# Patient Record
Sex: Female | Born: 1990 | Race: White | Hispanic: No | Marital: Single | Smoking: Current every day smoker
Health system: Southern US, Community
[De-identification: ages and names within clinical notes are randomized; demographics above are authoritative.]

## PROBLEM LIST (undated history)

## (undated) DIAGNOSIS — Z22322 Carrier or suspected carrier of Methicillin resistant Staphylococcus aureus: Secondary | ICD-10-CM

## (undated) DIAGNOSIS — B1920 Unspecified viral hepatitis C without hepatic coma: Secondary | ICD-10-CM

## (undated) DIAGNOSIS — F988 Other specified behavioral and emotional disorders with onset usually occurring in childhood and adolescence: Secondary | ICD-10-CM

## (undated) DIAGNOSIS — F1910 Other psychoactive substance abuse, uncomplicated: Secondary | ICD-10-CM

## (undated) HISTORY — DX: Other specified behavioral and emotional disorders with onset usually occurring in childhood and adolescence: F98.8

## (undated) HISTORY — PX: APPENDECTOMY: SHX54

---
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
## 2010-04-19 ENCOUNTER — Emergency Department (HOSPITAL_COMMUNITY): Admission: EM | Admit: 2010-04-19 | Discharge: 2010-04-19 | Payer: Self-pay | Admitting: Emergency Medicine

## 2011-01-25 LAB — POCT I-STAT, CHEM 8
BUN: 10 mg/dL (ref 6–23)
Calcium, Ion: 1.17 mmol/L (ref 1.12–1.32)
Chloride: 105 mEq/L (ref 96–112)
Creatinine, Ser: 0.8 mg/dL (ref 0.4–1.2)
Glucose, Bld: 92 mg/dL (ref 70–99)
HCT: 45 % (ref 36.0–46.0)
Hemoglobin: 15.3 g/dL — ABNORMAL HIGH (ref 12.0–15.0)
Potassium: 4 mEq/L (ref 3.5–5.1)
Sodium: 138 mEq/L (ref 135–145)
TCO2: 23 mmol/L (ref 0–100)

## 2011-01-25 LAB — POCT PREGNANCY, URINE: Preg Test, Ur: NEGATIVE

## 2012-07-25 ENCOUNTER — Ambulatory Visit (INDEPENDENT_AMBULATORY_CARE_PROVIDER_SITE_OTHER): Payer: 59 | Admitting: Emergency Medicine

## 2012-07-25 VITALS — BP 120/80 | HR 100 | Temp 98.4°F | Resp 16 | Ht 63.0 in | Wt 109.8 lb

## 2012-07-25 DIAGNOSIS — F988 Other specified behavioral and emotional disorders with onset usually occurring in childhood and adolescence: Secondary | ICD-10-CM

## 2012-07-25 MED ORDER — AMPHETAMINE-DEXTROAMPHET ER 20 MG PO CP24: 20.0000 mg | ORAL_CAPSULE | ORAL | Status: DC

## 2012-07-25 NOTE — Progress Notes (Signed)
   Date:  07/25/2012   Name:  Cassandra Roberts   DOB:  1991-06-18   MRN:  [id number] Gender: female Age: 21 y.o.  PCP:  Tonye Pearson, MD    Chief Complaint: Medication Refill   History of Present Illness:  Cassandra Roberts is a 21 y.o. pleasant patient who presents with the following:  Requesting a refill on aderall which was previously prescribed by Dr. Merla Riches for ADD.  Resuming college and working and needs to sharpen her focus as she is forgetting deadlines and having difficulty concentrating while studying.  Last prescription here was 20 mg and she said while living in [place], her doctor increased her to 20 in am and 10 in afternoon and then to 20 twice a day.  As she has no record of that, until she gets records sent here substantiating the change, we will stop at 20 XR.  There is no problem list on file for this patient.   No past medical history on file.  No past surgical history on file.  History  Substance Use Topics  . Smoking status: Current Every Day Smoker -- 2 years    Types: Cigarettes  . Smokeless tobacco: Not on file  . Alcohol Use: Not on file    No family history on file.  No Known Allergies  Medication list has been reviewed and updated.  No outpatient prescriptions prior to visit.    Review of Systems:  As per HPI, otherwise negative.    Physical Examination: Filed Vitals:   07/25/12 1620  BP: 120/80  Pulse: 100  Temp: 98.4 F (36.9 C)  Resp: 16   Filed Vitals:   07/25/12 1620  Height: 5\' 3"  (1.6 m)  Weight: 109 lb 12.8 oz (49.805 kg)   Body mass index is 19.45 kg/(m^2). Ideal Body Weight: Weight in (lb) to have BMI = 25: 140.8    GEN: WDWN, NAD, Non-toxic, Alert & Oriented x 3 HEENT: Atraumatic, Normocephalic.  Ears and Nose: No external deformity. EXTR: No clubbing/cyanosis/edema NEURO: Normal gait.  PSYCH: Normally interactive. Conversant. Not depressed or anxious appearing.  Calm demeanor.    Assessment and  Plan: ADD Refill meds  Follow up as needed  Carmelina Dane, MD I have reviewed and agree with documentation. Robert P. Merla Riches, M.D.

## 2012-09-22 ENCOUNTER — Encounter (HOSPITAL_COMMUNITY): Payer: Self-pay | Admitting: *Deleted

## 2012-09-22 ENCOUNTER — Emergency Department (HOSPITAL_COMMUNITY)
Admission: EM | Admit: 2012-09-22 | Discharge: 2012-09-22 | Discharge status: Against Medical Advice | Payer: 59 | Attending: Emergency Medicine | Admitting: Emergency Medicine

## 2012-09-22 DIAGNOSIS — R05 Cough: Secondary | ICD-10-CM | POA: Insufficient documentation

## 2012-09-22 DIAGNOSIS — R059 Cough, unspecified: Secondary | ICD-10-CM | POA: Insufficient documentation

## 2012-09-22 DIAGNOSIS — Z5321 Procedure and treatment not carried out due to patient leaving prior to being seen by health care provider: Secondary | ICD-10-CM

## 2012-09-22 NOTE — ED Notes (Signed)
The pt refuses a chest xray due to the expense

## 2012-09-22 NOTE — ED Notes (Signed)
Patient called x2 with no response from waiting room.

## 2012-09-22 NOTE — ED Notes (Signed)
Productive cough yellow

## 2012-09-22 NOTE — ED Notes (Signed)
She has a cough for 3 days and the over-the counter  Cough meds makes her feel bad.  Only cough med from a doctor helps.  She does not know the name

## 2012-10-01 ENCOUNTER — Emergency Department (HOSPITAL_COMMUNITY): Payer: 59

## 2012-10-01 ENCOUNTER — Emergency Department (HOSPITAL_COMMUNITY)
Admission: EM | Admit: 2012-10-01 | Discharge: 2012-10-01 | Disposition: A | Discharge status: Home / Self Care | Payer: 59 | Attending: Emergency Medicine | Admitting: Emergency Medicine

## 2012-10-01 ENCOUNTER — Encounter (HOSPITAL_COMMUNITY): Payer: Self-pay | Admitting: Emergency Medicine

## 2012-10-01 DIAGNOSIS — S20219A Contusion of unspecified front wall of thorax, initial encounter: Secondary | ICD-10-CM | POA: Insufficient documentation

## 2012-10-01 DIAGNOSIS — Y9389 Activity, other specified: Secondary | ICD-10-CM | POA: Insufficient documentation

## 2012-10-01 DIAGNOSIS — F17200 Nicotine dependence, unspecified, uncomplicated: Secondary | ICD-10-CM | POA: Insufficient documentation

## 2012-10-01 LAB — URINALYSIS, ROUTINE W REFLEX MICROSCOPIC
Bilirubin Urine: NEGATIVE
Glucose, UA: NEGATIVE mg/dL
Hgb urine dipstick: NEGATIVE
Protein, ur: NEGATIVE mg/dL

## 2012-10-01 MED ORDER — IBUPROFEN 600 MG PO TABS: 600.0000 mg | ORAL_TABLET | Freq: Four times a day (QID) | ORAL | Status: DC | PRN

## 2012-10-01 MED ORDER — HYDROCODONE-ACETAMINOPHEN 5-500 MG PO TABS: 1.0000 | ORAL_TABLET | Freq: Four times a day (QID) | ORAL | Status: DC | PRN

## 2012-10-01 NOTE — ED Provider Notes (Signed)
Medical screening examination/treatment/procedure(s) were performed by non-physician practitioner and as supervising physician I was immediately available for consultation/collaboration.   Celene Kras, MD 10/01/12 [phone]
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
## 2012-10-01 NOTE — ED Notes (Signed)
Patient returned from X-ray ambulatory with steady gait.  
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
## 2012-10-03 ENCOUNTER — Telehealth: Payer: Self-pay

## 2012-10-03 NOTE — Telephone Encounter (Signed)
PT NEEDS REFILL ON ADDERALL. [phone]

## 2012-10-03 NOTE — Telephone Encounter (Signed)
Can't do/more than 6 mos since last ov

## 2012-10-04 NOTE — Telephone Encounter (Signed)
Left message for patient to call back  

## 2012-10-05 NOTE — Telephone Encounter (Signed)
LMOM for patient to call back.

## 2012-10-06 NOTE — Telephone Encounter (Signed)
LMOM that she needs to RTC for RF of Adderall and she may CB if she would like to get Dr Doolittle's schedule.

## 2012-10-06 NOTE — Telephone Encounter (Signed)
No refills of controlled drugs without office visit.  Ideally, she should be referred to 104 for chronic care of ADD.

## 2012-10-06 NOTE — Telephone Encounter (Signed)
Dr Dareen Piano, you saw pt for ADD in 07/2012 w/note to f/up as needed. Is it ok to RF her Adderall XR 20 mg QD now and for 6 mos from that OV? EPIC shows last RF at OV on 07/25/12.

## 2012-11-07 ENCOUNTER — Telehealth: Payer: Self-pay

## 2012-11-07 NOTE — Telephone Encounter (Signed)
Pt is out of adderall and cant afford to come in to be seen and she does not know what to do about this  Best number [phone]

## 2012-11-07 NOTE — Telephone Encounter (Signed)
Would like to add a different number to call her [phone]

## 2012-11-08 NOTE — Telephone Encounter (Signed)
Called pt and advised her that we are very sorry but as Dr Dareen Piano had stated last month (see phone message), pt will have to RTC and est care w/a primary provider here who can continue to Rx her Adderall. Pt verbalized understanding.

## 2012-11-27 ENCOUNTER — Emergency Department (HOSPITAL_COMMUNITY): Payer: Self-pay

## 2012-11-27 ENCOUNTER — Emergency Department (HOSPITAL_COMMUNITY)
Admission: EM | Admit: 2012-11-27 | Discharge: 2012-11-27 | Disposition: A | Discharge status: Home / Self Care | Payer: Self-pay | Attending: Emergency Medicine | Admitting: Emergency Medicine

## 2012-11-27 DIAGNOSIS — R509 Fever, unspecified: Secondary | ICD-10-CM | POA: Insufficient documentation

## 2012-11-27 DIAGNOSIS — R05 Cough: Secondary | ICD-10-CM | POA: Insufficient documentation

## 2012-11-27 DIAGNOSIS — F17200 Nicotine dependence, unspecified, uncomplicated: Secondary | ICD-10-CM | POA: Insufficient documentation

## 2012-11-27 DIAGNOSIS — R059 Cough, unspecified: Secondary | ICD-10-CM | POA: Insufficient documentation

## 2012-11-27 DIAGNOSIS — Z79899 Other long term (current) drug therapy: Secondary | ICD-10-CM | POA: Insufficient documentation

## 2012-11-27 DIAGNOSIS — M940 Chondrocostal junction syndrome [Tietze]: Secondary | ICD-10-CM | POA: Insufficient documentation

## 2012-11-27 DIAGNOSIS — J3489 Other specified disorders of nose and nasal sinuses: Secondary | ICD-10-CM | POA: Insufficient documentation

## 2012-11-27 MED ORDER — HYDROCODONE-ACETAMINOPHEN 7.5-500 MG/15ML PO SOLN: 5.0000 mL | ORAL | Status: DC | PRN
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
## 2012-11-27 NOTE — ED Provider Notes (Signed)
Medical screening examination/treatment/procedure(s) were performed by non-physician practitioner and as supervising physician I was immediately available for consultation/collaboration.   Glynn Octave, MD 11/27/12 [phone]

## 2012-12-15 ENCOUNTER — Ambulatory Visit (INDEPENDENT_AMBULATORY_CARE_PROVIDER_SITE_OTHER): Payer: Managed Care, Other (non HMO) | Admitting: Family Medicine

## 2012-12-15 ENCOUNTER — Ambulatory Visit: Payer: Managed Care, Other (non HMO)

## 2012-12-15 VITALS — BP 137/86 | HR 88 | Temp 98.3°F | Resp 16 | Ht 63.0 in | Wt 116.0 lb

## 2012-12-15 DIAGNOSIS — M542 Cervicalgia: Secondary | ICD-10-CM

## 2012-12-15 DIAGNOSIS — S139XXA Sprain of joints and ligaments of unspecified parts of neck, initial encounter: Secondary | ICD-10-CM

## 2012-12-15 DIAGNOSIS — M419 Scoliosis, unspecified: Secondary | ICD-10-CM

## 2012-12-15 DIAGNOSIS — F988 Other specified behavioral and emotional disorders with onset usually occurring in childhood and adolescence: Secondary | ICD-10-CM

## 2012-12-15 MED ORDER — AMPHETAMINE-DEXTROAMPHET ER 20 MG PO CP24: 20.0000 mg | ORAL_CAPSULE | ORAL | Status: DC

## 2012-12-15 NOTE — Patient Instructions (Addendum)
1. ADD (attention deficit disorder)  EKG 12-Lead, amphetamine-dextroamphetamine (ADDERALL XR) 20 MG 24 hr capsule  2. Neck pain  DG Cervical Spine Complete    [facility] Policy for Prescribing Controlled Substances (Revised 09/2012) 1. Prescriptions for controlled substances will be filled by ONE provider at [facility] with whom you have established and developed a plan for your care, including follow-up. 2. You are encouraged to schedule an appointment with your prescriber at our appointment center for follow-up visits whenever possible. 3. If you request a prescription for the controlled substance while at [facility] for an acute problem (with someone other than your regular prescriber), you MAY be given a ONE-TIME prescription for a 30-day supply of the controlled substance, to allow time for you to return to see your regular prescriber for additional prescriptions.  PLEASE SCHEDULE FOLLOW-UP WITH DR. Merla Riches BY APPOINTMENT IN ONE MONTH FOR FURTHER REFILLS OF ADDERALL.    Cervical Strain and Sprain (Whiplash) with Rehab Cervical strain and sprains are injuries that commonly occur with "whiplash" injuries. Whiplash occurs when the neck is forcefully whipped backward or forward, such as during a motor vehicle accident. The muscles, ligaments, tendons, discs and nerves of the neck are susceptible to injury when this occurs. SYMPTOMS   Pain or stiffness in the front and/or back of neck  Symptoms may present immediately or up to 24 hours after injury.  Dizziness, headache, nausea and vomiting.  Muscle spasm with soreness and stiffness in the neck.  Tenderness and swelling at the injury site. CAUSES  Whiplash injuries often occur during contact sports or motor vehicle accidents.  RISK INCREASES WITH:  Osteoarthritis of the spine.  Situations that make head or neck accidents or trauma more likely.  High-risk sports (football, rugby, wrestling, hockey, auto racing, gymnastics, diving, contact karate or  boxing).  Poor strength and flexibility of the neck.  Previous neck injury.  Poor tackling technique.  Improperly fitted or padded equipment. PREVENTION  Learn and use proper technique (avoid tackling with the head, spearing and head-butting; use proper falling techniques to avoid landing on the head).  Warm up and stretch properly before activity.  Maintain physical fitness:  Strength, flexibility and endurance.  Cardiovascular fitness.  Wear properly fitted and padded protective equipment, such as padded soft collars, for participation in contact sports. PROGNOSIS  Recovery for cervical strain and sprain injuries is dependent on the extent of the injury. These injuries are usually curable in 1 week to 3 months with appropriate treatment.  RELATED COMPLICATIONS   Temporary numbness and weakness may occur if the nerve roots are damaged, and this may persist until the nerve has completely healed.  Chronic pain due to frequent recurrence of symptoms.  Prolonged healing, especially if activity is resumed too soon (before complete recovery). TREATMENT  Treatment initially involves the use of ice and medication to help reduce pain and inflammation. It is also important to perform strengthening and stretching exercises and modify activities that worsen symptoms so the injury does not get worse. These exercises may be performed at home or with a therapist. For patients who experience severe symptoms, a soft padded collar may be recommended to be worn around the neck.  Improving your posture may help reduce symptoms. Posture improvement includes pulling your chin and abdomen in while sitting or standing. If you are sitting, sit in a firm chair with your buttocks against the back of the chair. While sleeping, try replacing your pillow with a small towel rolled to 2 inches in diameter,  or use a cervical pillow or soft cervical collar. Poor sleeping positions delay healing.  For patients with  nerve root damage, which causes numbness or weakness, the use of a cervical traction apparatus may be recommended. Surgery is rarely necessary for these injuries. However, cervical strain and sprains that are present at birth (congenital) may require surgery. MEDICATION   If pain medication is necessary, nonsteroidal anti-inflammatory medications, such as aspirin and ibuprofen, or other minor pain relievers, such as acetaminophen, are often recommended.  Do not take pain medication for 7 days before surgery.  Prescription pain relievers may be given if deemed necessary by your caregiver. Use only as directed and only as much as you need. HEAT AND COLD:   Cold treatment (icing) relieves pain and reduces inflammation. Cold treatment should be applied for 10 to 15 minutes every 2 to 3 hours for inflammation and pain and immediately after any activity that aggravates your symptoms. Use ice packs or an ice massage.  Heat treatment may be used prior to performing the stretching and strengthening activities prescribed by your caregiver, physical therapist, or athletic trainer. Use a heat pack or a warm soak. SEEK MEDICAL CARE IF:   Symptoms get worse or do not improve in 2 weeks despite treatment.  New, unexplained symptoms develop (drugs used in treatment may produce side effects). EXERCISES RANGE OF MOTION (ROM) AND STRETCHING EXERCISES - Cervical Strain and Sprain These exercises may help you when beginning to rehabilitate your injury. In order to successfully resolve your symptoms, you must improve your posture. These exercises are designed to help reduce the forward-head and rounded-shoulder posture which contributes to this condition. Your symptoms may resolve with or without further involvement from your physician, physical therapist or athletic trainer. While completing these exercises, remember:   Restoring tissue flexibility helps normal motion to return to the joints. This allows healthier,  less painful movement and activity.  An effective stretch should be held for at least 20 seconds, although you may need to begin with shorter hold times for comfort.  A stretch should never be painful. You should only feel a gentle lengthening or release in the stretched tissue. STRETCH- Axial Extensors  Lie on your back on the floor. You may bend your knees for comfort. Place a rolled up hand towel or dish towel, about 2 inches in diameter, under the part of your head that makes contact with the floor.  Gently tuck your chin, as if trying to make a "double chin," until you feel a gentle stretch at the base of your head.  Hold __________ seconds. Repeat __________ times. Complete this exercise __________ times per day.  STRETECH - Axial Extension   Stand or sit on a firm surface. Assume a good posture: chest up, shoulders drawn back, abdominal muscles slightly tense, knees unlocked (if standing) and feet hip width apart.  Slowly retract your chin so your head slides back and your chin slightly lowers.Continue to look straight ahead.  You should feel a gentle stretch in the back of your head. Be certain not to feel an aggressive stretch since this can cause headaches later.  Hold for __________ seconds. Repeat __________ times. Complete this exercise __________ times per day. STRETCH  Cervical Side Bend   Stand or sit on a firm surface. Assume a good posture: chest up, shoulders drawn back, abdominal muscles slightly tense, knees unlocked (if standing) and feet hip width apart.  Without letting your nose or shoulders move, slowly tip your right /  left ear to your shoulder until your feel a gentle stretch in the muscles on the opposite side of your neck.  Hold __________ seconds. Repeat __________ times. Complete this exercise __________ times per day. STRETCH  Cervical Rotators   Stand or sit on a firm surface. Assume a good posture: chest up, shoulders drawn back, abdominal muscles  slightly tense, knees unlocked (if standing) and feet hip width apart.  Keeping your eyes level with the ground, slowly turn your head until you feel a gentle stretch along the back and opposite side of your neck.  Hold __________ seconds. Repeat __________ times. Complete this exercise __________ times per day. RANGE OF MOTION - Neck Circles   Stand or sit on a firm surface. Assume a good posture: chest up, shoulders drawn back, abdominal muscles slightly tense, knees unlocked (if standing) and feet hip width apart.  Gently roll your head down and around from the back of one shoulder to the back of the other. The motion should never be forced or painful.  Repeat the motion 10-20 times, or until you feel the neck muscles relax and loosen. Repeat __________ times. Complete the exercise __________ times per day. STRENGTHENING EXERCISES - Cervical Strain and Sprain These exercises may help you when beginning to rehabilitate your injury. They may resolve your symptoms with or without further involvement from your physician, physical therapist or athletic trainer. While completing these exercises, remember:   Muscles can gain both the endurance and the strength needed for everyday activities through controlled exercises.  Complete these exercises as instructed by your physician, physical therapist or athletic trainer. Progress the resistance and repetitions only as guided.  You may experience muscle soreness or fatigue, but the pain or discomfort you are trying to eliminate should never worsen during these exercises. If this pain does worsen, stop and make certain you are following the directions exactly. If the pain is still present after adjustments, discontinue the exercise until you can discuss the trouble with your clinician. STRENGTH Cervical Flexors, Isometric  Face a wall, standing about 6 inches away. Place a small pillow, a ball about 6-8 inches in diameter, or a folded towel between  your forehead and the wall.  Slightly tuck your chin and gently push your forehead into the soft object. Push only with mild to moderate intensity, building up tension gradually. Keep your jaw and forehead relaxed.  Hold 10 to 20 seconds. Keep your breathing relaxed.  Release the tension slowly. Relax your neck muscles completely before you start the next repetition. Repeat __________ times. Complete this exercise __________ times per day. STRENGTH- Cervical Lateral Flexors, Isometric   Stand about 6 inches away from a wall. Place a small pillow, a ball about 6-8 inches in diameter, or a folded towel between the side of your head and the wall.  Slightly tuck your chin and gently tilt your head into the soft object. Push only with mild to moderate intensity, building up tension gradually. Keep your jaw and forehead relaxed.  Hold 10 to 20 seconds. Keep your breathing relaxed.  Release the tension slowly. Relax your neck muscles completely before you start the next repetition. Repeat __________ times. Complete this exercise __________ times per day. STRENGTH  Cervical Extensors, Isometric   Stand about 6 inches away from a wall. Place a small pillow, a ball about 6-8 inches in diameter, or a folded towel between the back of your head and the wall.  Slightly tuck your chin and gently tilt your head  back into the soft object. Push only with mild to moderate intensity, building up tension gradually. Keep your jaw and forehead relaxed.  Hold 10 to 20 seconds. Keep your breathing relaxed.  Release the tension slowly. Relax your neck muscles completely before you start the next repetition. Repeat __________ times. Complete this exercise __________ times per day. POSTURE AND BODY MECHANICS CONSIDERATIONS - Cervical Strain and Sprain Keeping correct posture when sitting, standing or completing your activities will reduce the stress put on different body tissues, allowing injured tissues a chance  to heal and limiting painful experiences. The following are general guidelines for improved posture. Your physician or physical therapist will provide you with any instructions specific to your needs. While reading these guidelines, remember:  The exercises prescribed by your provider will help you have the flexibility and strength to maintain correct postures.  The correct posture provides the optimal environment for your joints to work. All of your joints have less wear and tear when properly supported by a spine with good posture. This means you will experience a healthier, less painful body.  Correct posture must be practiced with all of your activities, especially prolonged sitting and standing. Correct posture is as important when doing repetitive low-stress activities (typing) as it is when doing a single heavy-load activity (lifting). PROLONGED STANDING WHILE SLIGHTLY LEANING FORWARD When completing a task that requires you to lean forward while standing in one place for a long time, place either foot up on a stationary 2-4 inch high object to help maintain the best posture. When both feet are on the ground, the low back tends to lose its slight inward curve. If this curve flattens (or becomes too large), then the back and your other joints will experience too much stress, fatigue more quickly and can cause pain.  RESTING POSITIONS Consider which positions are most painful for you when choosing a resting position. If you have pain with flexion-based activities (sitting, bending, stooping, squatting), choose a position that allows you to rest in a less flexed posture. You would want to avoid curling into a fetal position on your side. If your pain worsens with extension-based activities (prolonged standing, working overhead), avoid resting in an extended position such as sleeping on your stomach. Most people will find more comfort when they rest with their spine in a more neutral position, neither  too rounded nor too arched. Lying on a non-sagging bed on your side with a pillow between your knees, or on your back with a pillow under your knees will often provide some relief. Keep in mind, being in any one position for a prolonged period of time, no matter how correct your posture, can still lead to stiffness. WALKING Walk with an upright posture. Your ears, shoulders and hips should all line-up. OFFICE WORK When working at a desk, create an environment that supports good, upright posture. Without extra support, muscles fatigue and lead to excessive strain on joints and other tissues. CHAIR:  A chair should be able to slide under your desk when your back makes contact with the back of the chair. This allows you to work closely.  The chair's height should allow your eyes to be level with the upper part of your monitor and your hands to be slightly lower than your elbows.  Body position:  Your feet should make contact with the floor. If this is not possible, use a foot rest.  Keep your ears over your shoulders. This will reduce stress on your neck  and low back. Document Released: 10/25/2005 Document Revised: 01/17/2012 Document Reviewed: 02/06/2009 [facility] Patient Information 2013 [place], [place].

## 2012-12-15 NOTE — Progress Notes (Signed)
[address]   [place], [place]  [address]   [phone]  Subjective:    Patient ID: Cassandra Roberts, female    DOB: September 20, 1991, 22 y.o.   MRN: [id number]  HPIThis 22 y.o. female presents for evaluation of the following:    1. ADD:  Diagnosed age unknown.  Diagnosed by Dr. Merla Riches in high school in 2009.  Unable to concentrate at work. 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  +stress lately which worsens when not on Adderall.  Adderall does help with stress.  Work performance good on medication.  Denies side effects to medications.    2.  Neck pain:  Has a very sensitive neck pain; pain with sleeping; onset for a while.  History of scoliosis.  Really tender between the bone.  Several year duration with gradual worsening.  No radiation into arms.  Neck pain will radiate into L upper back.  No n/t/w.  Some headaches.  Heat to area without improvement.  Taking Ibuprofen 4 tablets every two days.  Has stomach upset with Ibuprofen.  No formal exercises; no massages.  No xrays.  LMP two days ago; normal.  Has Tempur Pedic pillow.  Really big pillow.   Hair is very heavy.  Also waitress and carries heavy trays. +scoliosis history.   Review of Systems  Constitutional: Negative for chills, diaphoresis and fatigue.  Musculoskeletal: Positive for myalgias and back pain.  Neurological: Positive for headaches. Negative for weakness and numbness.  Psychiatric/Behavioral: Positive for decreased concentration. Negative for dysphoric mood and agitation. The patient is nervous/anxious.     Past Medical History  Diagnosis Date  . Attention deficit disorder (ADD)     Past Surgical History  Procedure Date  . Appendectomy     Prior to Admission medications   Medication Sig Start Date End Date Taking? Authorizing Provider  ibuprofen (ADVIL,MOTRIN) 200 MG tablet Take 400 mg by mouth every 8 (eight) hours as needed. For pain.   Yes Historical Provider, MD  vitamin C (ASCORBIC ACID) 500 MG  tablet Take 500 mg by mouth daily.   Yes Historical Provider, MD  amphetamine-dextroamphetamine (ADDERALL XR) 20 MG 24 hr capsule Take 1 capsule (20 mg total) by mouth every morning. 12/15/12   Ethelda Chick, MD  HYDROcodone-acetaminophen (LORTAB) 7.5-500 MG/15ML solution Take 5 mLs by mouth every 4 (four) hours as needed for pain or cough. 11/27/12   Fayrene Helper, PA-C    Allergies  Allergen Reactions  . Dextromethorphan     Shaking    History   Social History  . Marital Status: Single    Spouse Name: N/A    Number of Children: N/A  . Years of Education: N/A   Occupational History  . waitress    Social History Main Topics  . Smoking status: Current Every Day Smoker -- 0.2 packs/day for 2 years    Types: Cigarettes  . Smokeless tobacco: Never Used  . Alcohol Use: Yes     Comment: occasionally  . Drug Use: 1 per week    Special: Marijuana  . Sexually Active: Not Currently    Birth Control/ Protection: None   Other Topics Concern  . Not on file   Social History Narrative   Marital status: single   Employment: waitress   Tobacco: daily   Alcohol: socially   Drugs: marijuana     History reviewed. No pertinent family history.     Objective:   Physical Exam  Nursing note and vitals reviewed.  Constitutional: She is oriented to person, place, and time. She appears well-developed and well-nourished. No distress.  Eyes: Conjunctivae normal are normal. Pupils are equal, round, and reactive to light.  Neck: Neck supple. No thyromegaly present.  Cardiovascular: Normal rate, regular rhythm and normal heart sounds.   No murmur heard. Pulmonary/Chest: Effort normal and breath sounds normal.  Musculoskeletal:       Cervical back: She exhibits tenderness and pain. She exhibits normal range of motion, no bony tenderness and no spasm.       CERVICAL SPINE:  NO MIDLINE TTP; +TTP PARASPINAL REGION AND LOCATED AT OCCIPITAL RIDGE.  FULL ROM B SHOULDERS; MOTOR 5/5 BUE.  GRIP 5/5.      Neurological: She is alert and oriented to person, place, and time. She has normal reflexes. No cranial nerve deficit. She exhibits normal muscle tone. Coordination normal.  Skin: No rash noted. She is not diaphoretic.  Psychiatric: She has a normal mood and affect. Her behavior is normal. Judgment and thought content normal.   [facility] reading (PRIMARY) by  Dr. Katrinka Blazing.  C-SPINE:  NAD; mild curvature.  EKG: NSR      Assessment & Plan:   1. ADD (attention deficit disorder)  EKG 12-Lead, amphetamine-dextroamphetamine (ADDERALL XR) 20 MG 24 hr capsule  2. Neck pain  DG Cervical Spine Complete  3. Sprain, neck    4. Scoliosis       1.  ADD:  Controlled; refill x 1 provided; s/p baseline EKG and normal; reviewed new office policy regarding seeing same provider for Adderall refills; will schedule appointment in one month with Dr. Merla Riches.  Pt expressed understanding. 2.  Neck pain/strain:  New.  Currently waitress and also has large dredlocks/heavy hair.  Advise heat to area daily; also provided with home exercise program.  Recommend firm pillow for sleep. 3. Scoliosis: chronic; may also be contributing to intermittent neck pain.  Meds ordered this encounter  Medications  . DISCONTD: amphetamine-dextroamphetamine (ADDERALL) 20 MG tablet    Sig: Take 20 mg by mouth daily.  Marland Kitchen amphetamine-dextroamphetamine (ADDERALL XR) 20 MG 24 hr capsule    Sig: Take 1 capsule (20 mg total) by mouth every morning.    Dispense:  30 capsule    Refill:  0

## 2012-12-19 NOTE — Progress Notes (Signed)
Made appt with Dr. Merla Riches for 3/5 at 9:45. Pt phone not working, reminder letter sent with appt details.

## 2013-01-10 ENCOUNTER — Ambulatory Visit (INDEPENDENT_AMBULATORY_CARE_PROVIDER_SITE_OTHER): Payer: Managed Care, Other (non HMO) | Admitting: Internal Medicine

## 2013-01-10 ENCOUNTER — Encounter: Payer: Self-pay | Admitting: Internal Medicine

## 2013-01-10 VITALS — BP 124/75 | HR 77 | Temp 97.6°F | Resp 16 | Ht 63.0 in | Wt 115.0 lb

## 2013-01-10 DIAGNOSIS — F988 Other specified behavioral and emotional disorders with onset usually occurring in childhood and adolescence: Secondary | ICD-10-CM

## 2013-01-10 MED ORDER — AMPHETAMINE-DEXTROAMPHETAMINE 10 MG PO TABS: ORAL_TABLET | ORAL | Status: DC

## 2013-01-10 MED ORDER — AMPHETAMINE-DEXTROAMPHET ER 20 MG PO CP24: 20.0000 mg | ORAL_CAPSULE | ORAL | Status: DC

## 2013-01-12 NOTE — Progress Notes (Signed)
Followup for attention deficit disorder  Has finally stabilized life enough to move to [place] and enroll again in college, in a nursing program. First diagnosed with attention deficit disorder in September 2009. Mother is on medication for attention deficit disorder as well. She has had irregular followup and use medications intermittently but also not been in school with any regularity. Lots of family turmoil. Very difficult relationship with her mother. Is now trying to reach out to family members in [place] and other places that she has never had a chance to know. 1 Sister is living in [place] as well.  Denies current problems with drugs or alcohol and no current steady relationship.  BP 124/75  Pulse 77  Temp(Src) 97.6 F (36.4 C) (Oral)  Resp 16  Ht 5\' 3"  (1.6 m)  Wt 115 lb (52.164 kg)  BMI 20.38 kg/m2  SpO2 99%  LMP 12/12/2012  Neurological intact Psychological stable  Problem #1 attention deficit disorder  Adderall 20XR am, then 10mg  regular release at 4 PM if necessary Meds ordered this encounter  Medications  . amphetamine-dextroamphetamine (ADDERALL XR) 20 MG 24 hr capsule    Sig: Take 1 capsule (20 mg total) by mouth every morning. May fill today    Dispense:  30 capsule    Refill:  0  . amphetamine-dextroamphetamine (ADDERALL) 10 MG tablet    Sig: 1 at 4 pm for after 02/09/13    Dispense:  30 tablet    Refill:  0  . amphetamine-dextroamphetamine (ADDERALL) 10 MG tablet    Sig: 1 at 4 pm for after 03/11/13    Dispense:  30 tablet    Refill:  0  . amphetamine-dextroamphetamine (ADDERALL) 10 MG tablet    Sig: 1 at 4pm    Dispense:  30 tablet    Refill:  0  . amphetamine-dextroamphetamine (ADDERALL XR) 20 MG 24 hr capsule    Sig: Take 1 capsule (20 mg total) by mouth every morning. For after 02/09/13    Dispense:  30 capsule    Refill:  0  . amphetamine-dextroamphetamine (ADDERALL XR) 20 MG 24 hr capsule    Sig: Take 1 capsule (20 mg  total) by mouth every morning. For 03/12/13    Dispense:  30 capsule    Refill:  0   may refill again in followup in 6 months unless problems

## 2013-03-12 ENCOUNTER — Telehealth: Payer: Self-pay

## 2013-03-12 DIAGNOSIS — F988 Other specified behavioral and emotional disorders with onset usually occurring in childhood and adolescence: Secondary | ICD-10-CM

## 2013-03-12 NOTE — Telephone Encounter (Signed)
Called her, she should check at the pharmacy and see if they have it. Could not leave message, voice mail box not set up yet.

## 2013-03-12 NOTE — Telephone Encounter (Signed)
Pt states she lost her adderall rx    [facility] phone [phone]

## 2013-03-15 ENCOUNTER — Telehealth: Payer: Self-pay

## 2013-03-15 MED ORDER — AMPHETAMINE-DEXTROAMPHETAMINE 10 MG PO TABS: ORAL_TABLET | ORAL | Status: DC

## 2013-03-15 MED ORDER — AMPHETAMINE-DEXTROAMPHET ER 20 MG PO CP24: 20.0000 mg | ORAL_CAPSULE | ORAL | Status: DC

## 2013-03-15 NOTE — Telephone Encounter (Signed)
Patient says pharmacy does have rx. She would like [place] to call the pharmacy stating that she lost her rx and needs a refill. Patient would like someone to call her if there are any issues.  [facility] [phone]

## 2013-03-15 NOTE — Telephone Encounter (Signed)
The last Rx was due on 03/12/13. She states she has lost this, can you replace?

## 2013-03-15 NOTE — Telephone Encounter (Signed)
RX at front desk, unable to reach patient to advise, phone #s given not accepting incoming calls

## 2013-03-15 NOTE — Telephone Encounter (Signed)
Meds ordered this encounter  Medications  . amphetamine-dextroamphetamine (ADDERALL) 10 MG tablet    Sig: 1 at 4 pm for after 03/11/13    Dispense:  30 tablet    Refill:  0  . amphetamine-dextroamphetamine (ADDERALL XR) 20 MG 24 hr capsule    Sig: Take 1 capsule (20 mg total) by mouth every morning. For 03/12/13    Dispense:  30 capsule    Refill:  0

## 2013-03-17 ENCOUNTER — Telehealth: Payer: Self-pay

## 2013-03-17 NOTE — Telephone Encounter (Signed)
PATIENT LOST HER RX FOR ADDERALL WOULD LIKE TO KNOW IF SHE CAN GET ANOTHER RX FOR THIS [phone]

## 2013-03-19 ENCOUNTER — Telehealth: Payer: Self-pay

## 2013-03-19 NOTE — Telephone Encounter (Signed)
PATIENT STATES THAT SHE LOST HER ADDERALL RX AND HAS BEEN CALLING OUR OFFICE FOR THE LAST TWO WEEKS AND HAS NOT GOTTEN A CALL BACK. PATIENT SAYS WE SHOULD BE ABLE TO LOOK IN HER FILE AND SEE THAT SHE HAS NOT GOTTEN IT FILLED OR ANYTHING. PATIENT ALSO WANTS THE RX SENT STRAIGHT TO HER PHARMACY. PATIENT STATES SHE NEEDS THIS MEDICATION TO GET HER LIFE BACK ON TRACK.

## 2013-03-19 NOTE — Telephone Encounter (Signed)
Called her to advise Rx at front desk, she did not get my previous message.

## 2013-03-19 NOTE — Telephone Encounter (Signed)
PATIENT JUST CALLED BACK AND SAID SHE IS ON HER WAY TO GET RX. DIDN'T REALIZE THE OFFICE CALLED AND LEFT HER A MESSAGE YESTERDAY OR THE DAY BEFORE.Marland KitchenMarland KitchenMarland Kitchen

## 2013-03-19 NOTE — Telephone Encounter (Signed)
See 5/5 At front desk

## 2013-08-21 ENCOUNTER — Encounter (HOSPITAL_COMMUNITY): Payer: Self-pay | Admitting: Emergency Medicine

## 2013-08-21 ENCOUNTER — Emergency Department (HOSPITAL_COMMUNITY)
Admission: EM | Admit: 2013-08-21 | Discharge: 2013-08-21 | Disposition: A | Discharge status: Home / Self Care | Payer: 59 | Attending: Emergency Medicine | Admitting: Emergency Medicine

## 2013-08-21 DIAGNOSIS — F1910 Other psychoactive substance abuse, uncomplicated: Secondary | ICD-10-CM

## 2013-08-21 DIAGNOSIS — R059 Cough, unspecified: Secondary | ICD-10-CM | POA: Insufficient documentation

## 2013-08-21 DIAGNOSIS — F17200 Nicotine dependence, unspecified, uncomplicated: Secondary | ICD-10-CM | POA: Insufficient documentation

## 2013-08-21 DIAGNOSIS — R0789 Other chest pain: Secondary | ICD-10-CM | POA: Insufficient documentation

## 2013-08-21 DIAGNOSIS — F3289 Other specified depressive episodes: Secondary | ICD-10-CM | POA: Insufficient documentation

## 2013-08-21 DIAGNOSIS — F39 Unspecified mood [affective] disorder: Secondary | ICD-10-CM | POA: Insufficient documentation

## 2013-08-21 DIAGNOSIS — R45851 Suicidal ideations: Secondary | ICD-10-CM

## 2013-08-21 DIAGNOSIS — Z79899 Other long term (current) drug therapy: Secondary | ICD-10-CM | POA: Insufficient documentation

## 2013-08-21 DIAGNOSIS — Z3202 Encounter for pregnancy test, result negative: Secondary | ICD-10-CM | POA: Insufficient documentation

## 2013-08-21 DIAGNOSIS — R05 Cough: Secondary | ICD-10-CM | POA: Insufficient documentation

## 2013-08-21 DIAGNOSIS — F329 Major depressive disorder, single episode, unspecified: Secondary | ICD-10-CM | POA: Diagnosis present

## 2013-08-21 DIAGNOSIS — F1410 Cocaine abuse, uncomplicated: Secondary | ICD-10-CM | POA: Insufficient documentation

## 2013-08-21 DIAGNOSIS — F32A Depression, unspecified: Secondary | ICD-10-CM | POA: Diagnosis present

## 2013-08-21 DIAGNOSIS — F1010 Alcohol abuse, uncomplicated: Secondary | ICD-10-CM | POA: Insufficient documentation

## 2013-08-21 LAB — URINALYSIS, ROUTINE W REFLEX MICROSCOPIC
Bilirubin Urine: NEGATIVE
Glucose, UA: NEGATIVE mg/dL
Hgb urine dipstick: NEGATIVE
Ketones, ur: NEGATIVE mg/dL
pH: 6.5 (ref 5.0–8.0)

## 2013-08-21 LAB — CBC WITH DIFFERENTIAL/PLATELET
Eosinophils Absolute: 0.1 10*3/uL (ref 0.0–0.7)
Eosinophils Relative: 1 % (ref 0–5)
HCT: 42.2 % (ref 36.0–46.0)
Lymphocytes Relative: 19 % (ref 12–46)
Lymphs Abs: 1.7 10*3/uL (ref 0.7–4.0)
MCH: 31.4 pg (ref 26.0–34.0)
MCV: 90.2 fL (ref 78.0–100.0)
Monocytes Absolute: 0.8 10*3/uL (ref 0.1–1.0)
RBC: 4.68 MIL/uL (ref 3.87–5.11)
RDW: 13.9 % (ref 11.5–15.5)
WBC: 8.7 10*3/uL (ref 4.0–10.5)

## 2013-08-21 LAB — RAPID URINE DRUG SCREEN, HOSP PERFORMED
Amphetamines: NOT DETECTED
Barbiturates: NOT DETECTED
Benzodiazepines: NOT DETECTED
Tetrahydrocannabinol: POSITIVE — AB

## 2013-08-21 LAB — POCT PREGNANCY, URINE: Preg Test, Ur: NEGATIVE

## 2013-08-21 LAB — BASIC METABOLIC PANEL
CO2: 23 mEq/L (ref 19–32)
Calcium: 9.3 mg/dL (ref 8.4–10.5)
Creatinine, Ser: 0.65 mg/dL (ref 0.50–1.10)
Glucose, Bld: 99 mg/dL (ref 70–99)

## 2013-08-21 MED ORDER — NICOTINE 21 MG/24HR TD PT24
21.0000 mg | MEDICATED_PATCH | Freq: Every day | TRANSDERMAL | Status: DC
  Administered 2013-08-21: 21 mg via TRANSDERMAL
  Filled 2013-08-21: qty 1

## 2013-08-21 MED ORDER — ONDANSETRON HCL 4 MG PO TABS: 4.0000 mg | ORAL_TABLET | Freq: Three times a day (TID) | ORAL | Status: DC | PRN

## 2013-08-21 MED ORDER — LORAZEPAM 1 MG PO TABS: 1.0000 mg | ORAL_TABLET | Freq: Three times a day (TID) | ORAL | Status: DC | PRN

## 2013-08-21 MED ORDER — ACETAMINOPHEN 325 MG PO TABS: 650.0000 mg | ORAL_TABLET | ORAL | Status: DC | PRN

## 2013-08-21 MED ORDER — BENZONATATE 100 MG PO CAPS: 100.0000 mg | ORAL_CAPSULE | Freq: Two times a day (BID) | ORAL | Status: DC | PRN

## 2013-08-21 MED ORDER — ZOLPIDEM TARTRATE 5 MG PO TABS: 5.0000 mg | ORAL_TABLET | Freq: Every evening | ORAL | Status: DC | PRN
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
## 2013-08-21 NOTE — ED Notes (Signed)
Patient placed in blue scrubs and wanded by security. Belongings bag x 1 with dress, under garments, bracelet, necklace, and shoes.

## 2013-08-21 NOTE — Progress Notes (Signed)
[facility] provided pt with [employer], highlighting [facility] [place]. Patient stated that she was getting insurance through her father in January.

## 2013-08-21 NOTE — ED Provider Notes (Signed)
6:28 PM patient here for depression. Evaluated by Dr. Ladona Ridgel with psychiatry he notes that the patient does not meet inpatient criteria and recommends discharge with outpatient referrals. I reviewed her labwork and re-examined the pt.   6:28 PM:  I have discussed the diagnosis/risks/treatment options with the patient and believe the pt to be eligible for discharge home to follow-up with outpt psych. We also discussed returning to the ED immediately if new or worsening sx occur. We discussed the sx which are most concerning (e.g., SI, worsening depression) that necessitate immediate return. Any new prescriptions provided to the patient are listed below.  Clinical Impression 1. Mood disorder   2. Suicidal ideation   3. Substance abuse   4. Depression      Junius Argyle, MD 08/22/13 1317
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
## 2013-08-21 NOTE — ED Notes (Signed)
Per pt, states she needs detox from ETOH and Crack-last use was yesterday-daily drinker-thinks she might be pregnant

## 2013-08-21 NOTE — BH Assessment (Signed)
Writer was informed that the patient is in need of a consult.  Writer informed (Paige) [facility] Office so that the patient can be placed on the shift report and Tele Assessment Log.  Writer informed Dr. Taylor and NP, Shuvon that the patient needs to be assessed.  

## 2013-08-21 NOTE — [facility] Counselor (Signed)
Writer consulted with Dr. Ladona Ridgel and the NP, [facility] regarding the patient not meeting criteria for inpatient hospitalization.   Writer informed the ER MD and the nurse working with the patient that she will be given outpatient referrals and then discharged home.

## 2013-08-23 NOTE — Consult Note (Signed)
Note reviewed and agreed with  

## 2013-08-25 NOTE — ED Provider Notes (Signed)
Medical screening examination/treatment/procedure(s) were conducted as a shared visit with non-physician practitioner(s) and myself.  I personally evaluated the patient during the encounter.  Patient complains of polysubstance abuse and depression. She is not psychotic. Behavioral health consult.  Donnetta Hutching, MD 08/25/13 [phone]

## 2013-09-01 ENCOUNTER — Encounter (HOSPITAL_COMMUNITY): Payer: Self-pay | Admitting: Emergency Medicine

## 2013-09-01 ENCOUNTER — Emergency Department (HOSPITAL_COMMUNITY): Payer: No Typology Code available for payment source

## 2013-09-01 ENCOUNTER — Emergency Department (HOSPITAL_COMMUNITY)
Admission: EM | Admit: 2013-09-01 | Discharge: 2013-09-01 | Disposition: A | Discharge status: Home / Self Care | Payer: No Typology Code available for payment source | Attending: Emergency Medicine | Admitting: Emergency Medicine

## 2013-09-01 DIAGNOSIS — S0003XA Contusion of scalp, initial encounter: Secondary | ICD-10-CM | POA: Insufficient documentation

## 2013-09-01 DIAGNOSIS — Z3202 Encounter for pregnancy test, result negative: Secondary | ICD-10-CM | POA: Insufficient documentation

## 2013-09-01 DIAGNOSIS — R000 Tachycardia, unspecified: Secondary | ICD-10-CM | POA: Insufficient documentation

## 2013-09-01 DIAGNOSIS — F17200 Nicotine dependence, unspecified, uncomplicated: Secondary | ICD-10-CM | POA: Insufficient documentation

## 2013-09-01 DIAGNOSIS — S022XXA Fracture of nasal bones, initial encounter for closed fracture: Secondary | ICD-10-CM | POA: Insufficient documentation

## 2013-09-01 DIAGNOSIS — S0083XA Contusion of other part of head, initial encounter: Secondary | ICD-10-CM

## 2013-09-01 DIAGNOSIS — F988 Other specified behavioral and emotional disorders with onset usually occurring in childhood and adolescence: Secondary | ICD-10-CM | POA: Insufficient documentation

## 2013-09-01 LAB — POCT PREGNANCY, URINE: Preg Test, Ur: NEGATIVE

## 2013-09-01 MED ORDER — LORAZEPAM 1 MG PO TABS
1.0000 mg | ORAL_TABLET | Freq: Once | ORAL | Status: AC
  Administered 2013-09-01: 1 mg via ORAL
  Filled 2013-09-01: qty 1

## 2013-09-01 MED ORDER — KETOROLAC TROMETHAMINE 60 MG/2ML IM SOLN
60.0000 mg | Freq: Once | INTRAMUSCULAR | Status: AC
  Administered 2013-09-01: 60 mg via INTRAMUSCULAR
  Filled 2013-09-01: qty 2

## 2013-09-01 MED ORDER — TRAMADOL HCL 50 MG PO TABS: 50.0000 mg | ORAL_TABLET | Freq: Four times a day (QID) | ORAL | Status: DC | PRN
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
## 2013-09-02 NOTE — ED Provider Notes (Signed)
  Medical screening examination/treatment/procedure(s) were performed by non-physician practitioner and as supervising physician I was immediately available for consultation/collaboration.     Gerhard Munch, MD 09/02/13 [phone]

## 2013-10-22 ENCOUNTER — Emergency Department (HOSPITAL_COMMUNITY)
Admission: EM | Admit: 2013-10-22 | Discharge: 2013-10-22 | Disposition: A | Discharge status: Home / Self Care | Payer: 59 | Attending: Emergency Medicine | Admitting: Emergency Medicine

## 2013-10-22 ENCOUNTER — Emergency Department (HOSPITAL_COMMUNITY): Payer: 59

## 2013-10-22 DIAGNOSIS — IMO0001 Reserved for inherently not codable concepts without codable children: Secondary | ICD-10-CM | POA: Insufficient documentation

## 2013-10-22 DIAGNOSIS — Z888 Allergy status to other drugs, medicaments and biological substances status: Secondary | ICD-10-CM | POA: Insufficient documentation

## 2013-10-22 DIAGNOSIS — S29011A Strain of muscle and tendon of front wall of thorax, initial encounter: Secondary | ICD-10-CM

## 2013-10-22 DIAGNOSIS — R0602 Shortness of breath: Secondary | ICD-10-CM | POA: Insufficient documentation

## 2013-10-22 DIAGNOSIS — Y9389 Activity, other specified: Secondary | ICD-10-CM | POA: Insufficient documentation

## 2013-10-22 DIAGNOSIS — X500XXA Overexertion from strenuous movement or load, initial encounter: Secondary | ICD-10-CM | POA: Insufficient documentation

## 2013-10-22 DIAGNOSIS — Y929 Unspecified place or not applicable: Secondary | ICD-10-CM | POA: Insufficient documentation

## 2013-10-22 DIAGNOSIS — F17200 Nicotine dependence, unspecified, uncomplicated: Secondary | ICD-10-CM | POA: Insufficient documentation

## 2013-10-22 DIAGNOSIS — S2341XA Sprain of ribs, initial encounter: Secondary | ICD-10-CM | POA: Insufficient documentation

## 2013-10-22 DIAGNOSIS — F988 Other specified behavioral and emotional disorders with onset usually occurring in childhood and adolescence: Secondary | ICD-10-CM | POA: Insufficient documentation

## 2013-10-22 MED ORDER — CYCLOBENZAPRINE HCL 10 MG PO TABS: 10.0000 mg | ORAL_TABLET | Freq: Two times a day (BID) | ORAL | Status: DC | PRN

## 2013-10-22 MED ORDER — IBUPROFEN 800 MG PO TABS: 800.0000 mg | ORAL_TABLET | Freq: Three times a day (TID) | ORAL | Status: DC

## 2013-10-22 MED ORDER — GUAIFENESIN 100 MG/5ML PO LIQD: 100.0000 mg | ORAL | Status: DC | PRN

## 2013-10-22 NOTE — ED Notes (Addendum)
Coughing a lot and not a lot coming up x 2 weeks and has a wart on her  Rt  Hand that needs to come off under rt ribs hurts like the devil when she coughs

## 2013-10-22 NOTE — ED Notes (Signed)
R rib pain when cough.

## 2013-10-22 NOTE — ED Notes (Signed)
Patient ambulated to restroom and tolerated well.  

## 2013-10-22 NOTE — ED Provider Notes (Signed)
CSN: [id number]     Arrival date & time 10/22/13  1218 History   First MD Initiated Contact with Patient 10/22/13 1258   This chart was scribed for Cecilio Asper, a non-physician practitioner working with Laray Anger, DO by Lewanda Rife, ED Scribe. This patient was seen in room TR09C/TR09C and the patient's care was started at 1:34 PM      Chief Complaint  Patient presents with  . Cough   (Consider location/radiation/quality/duration/timing/severity/associated sxs/prior Treatment) The history is provided by the patient. No language interpreter was used.   HPI Comments: Cassandra Roberts is a 22 y.o. female who presents to the Emergency Department complaining of constant worsening right sided chest wall pain onset 3 weeks. Reports associated recently resolved URI. Reports pain is exacerbated by coughing and by touch. Denies any alleviating factors. Denies recent long travel or extended immobility, prior history of DVT or PE, unilateral leg swelling or pain, hemoptysis, active cancer, or estrogen (birth control) use.      Past Medical History  Diagnosis Date  . Attention deficit disorder (ADD)    Past Surgical History  Procedure Laterality Date  . Appendectomy     No family history on file. History  Substance Use Topics  . Smoking status: Current Every Day Smoker -- 0.25 packs/day for 2 years    Types: Cigarettes  . Smokeless tobacco: Never Used  . Alcohol Use: Yes     Comment: daily   OB History   Grav Para Term Preterm Abortions TAB SAB Ect Mult Living                 Review of Systems  Constitutional: Negative for fever.  Respiratory: Positive for cough.   Musculoskeletal: Positive for myalgias.   A complete 10 system review of systems was obtained and all systems are negative except as noted in the HPI and PMHx.    Allergies  Dextromethorphan  Home Medications   Current Outpatient Rx  Name  Route  Sig  Dispense  Refill  . Acetaminophen-DM (TYLENOL  COUGH/SORE THROAT PO)   Oral   Take 2 tablets by mouth every 4 (four) hours as needed.          BP 132/59  Pulse 75  Temp(Src) 98.7 F (37.1 C) (Oral)  Resp 16  Wt 121 lb (54.885 kg)  SpO2 97%  LMP 10/22/2013 Physical Exam  Nursing note and vitals reviewed. Constitutional: She is oriented to person, place, and time. She appears well-developed and well-nourished. No distress.  HENT:  Head: Normocephalic and atraumatic.  Eyes: Conjunctivae and EOM are normal.  Neck: Neck supple. No tracheal deviation present.  Cardiovascular: Normal rate, regular rhythm, normal heart sounds and intact distal pulses.  Exam reveals no gallop and no friction rub.   No murmur heard. Pulmonary/Chest: Effort normal and breath sounds normal. No respiratory distress. She has no wheezes. She has no rales. She exhibits tenderness.  Reproducible right sided chest tenderness over intercostal spaces  6-8   Musculoskeletal: Normal range of motion.  Neurological: She is alert and oriented to person, place, and time.  Skin: Skin is warm and dry.  Psychiatric: She has a normal mood and affect. Her behavior is normal.    ED Course  Procedures (including critical care time)  DIAGNOSTIC STUDIES: Oxygen Saturation is 97% on room air, normal by my interpretation.    COORDINATION OF CARE:  Nursing notes reviewed. Vital signs reviewed. Initial pt interview and examination performed.   1:37 PM-Discussed work  up plan with pt at bedside, which includes CXR. Pt agrees with plan.  1:44 PM Nursing Notes Reviewed/ Care Coordinated Applicable Imaging Reviewed and incorporated into ED treatment Discussed results and treatment plan with pt. Pt demonstrates understanding and agrees with plan.  Treatment plan initiated:Medications - No data to display   Initial diagnostic testing ordered.   Labs Review Labs Reviewed - No data to display Imaging Review Dg Chest 2 View  10/22/2013   CLINICAL DATA:  Cough and  shortness of breath.  Chest pain.  EXAM: CHEST  2 VIEW  COMPARISON:  Two-view chest 11/27/2012  FINDINGS: The heart size and mediastinal contours are within normal limits. Both lungs are clear. The visualized skeletal structures are unremarkable.  IMPRESSION: Negative two view chest x-ray   Electronically Signed   By: Gennette Pac M.D.   On: 10/22/2013 13:12    EKG Interpretation   None       MDM   1. Intercostal muscle strain, initial encounter     Patient with cough x3 weeks. Chest x-ray is negative. Vital signs are stable. No hemoptysis. Symptomatic therapy. Recommend PCP followup.  I personally performed the services described in this documentation, which was scribed in my presence. The recorded information has been reviewed and is accurate.    PERC negative and low risk for PE    Roxy Horseman, PA-C 10/22/13 1514

## 2013-10-23 NOTE — ED Provider Notes (Signed)
Medical screening examination/treatment/procedure(s) were performed by non-physician practitioner and as supervising physician I was immediately available for consultation/collaboration.  EKG Interpretation   None         Dajanae Brophy M Denice Cardon, DO 10/23/13 0558 

## 2014-04-25 ENCOUNTER — Encounter (HOSPITAL_COMMUNITY): Payer: Self-pay | Admitting: Emergency Medicine

## 2014-04-25 ENCOUNTER — Emergency Department (HOSPITAL_COMMUNITY)
Admission: EM | Admit: 2014-04-25 | Discharge: 2014-04-25 | Disposition: A | Discharge status: Home / Self Care | Payer: 59 | Attending: Emergency Medicine | Admitting: Emergency Medicine

## 2014-04-25 DIAGNOSIS — F17200 Nicotine dependence, unspecified, uncomplicated: Secondary | ICD-10-CM | POA: Insufficient documentation

## 2014-04-25 DIAGNOSIS — F329 Major depressive disorder, single episode, unspecified: Secondary | ICD-10-CM | POA: Insufficient documentation

## 2014-04-25 DIAGNOSIS — F3289 Other specified depressive episodes: Secondary | ICD-10-CM | POA: Insufficient documentation

## 2014-04-25 DIAGNOSIS — Z791 Long term (current) use of non-steroidal anti-inflammatories (NSAID): Secondary | ICD-10-CM | POA: Insufficient documentation

## 2014-04-25 DIAGNOSIS — F39 Unspecified mood [affective] disorder: Secondary | ICD-10-CM

## 2014-04-25 DIAGNOSIS — F988 Other specified behavioral and emotional disorders with onset usually occurring in childhood and adolescence: Secondary | ICD-10-CM

## 2014-04-25 DIAGNOSIS — Z79899 Other long term (current) drug therapy: Secondary | ICD-10-CM | POA: Insufficient documentation

## 2014-04-25 DIAGNOSIS — F1910 Other psychoactive substance abuse, uncomplicated: Secondary | ICD-10-CM | POA: Insufficient documentation

## 2014-04-25 DIAGNOSIS — F32A Depression, unspecified: Secondary | ICD-10-CM

## 2014-04-25 NOTE — ED Provider Notes (Signed)
CSN: [id number]     Arrival date & time 04/25/14  1056 History   First MD Initiated Contact with Patient 04/25/14 1114     Chief Complaint  Patient presents with  . detox      (Consider location/radiation/quality/duration/timing/severity/associated sxs/prior Treatment) The history is provided by the patient and medical records. No language interpreter was used.    Cassandra Roberts is a 23 y.o. female  with a hx of depression, substance abuse, ADHD presents to the Emergency Department complaining of continued use of opiates and heroin for approximately 2 years. Patient reports she has been evaluated for detox in the past but has never been clean more than several days. She reports her life is falling apart and she wants help.  Patient reports feeling hopeless but denies suicidal ideations or previous suicide attempt. Patient reports she is using a minimum of 3 bags of heroin per day.  She reports only occasional alcohol use and never on a regular basis.  Patient denies homicidal ideations, auditory or visual hallucinations.  No aggravating or alleviating factors.  Patient reports her last use was this morning. Patient denies fever, chills, headache neck pain chest pain, shortness of breath, abdominal pain, nausea, vomiting, diarrhea, weakness, dizziness, syncope, dysuria and hematuria.   Past Medical History  Diagnosis Date  . Attention deficit disorder (ADD)    Past Surgical History  Procedure Laterality Date  . Appendectomy     No family history on file. History  Substance Use Topics  . Smoking status: Current Every Day Smoker -- 0.25 packs/day for 2 years    Types: Cigarettes  . Smokeless tobacco: Never Used  . Alcohol Use: Yes     Comment: daily   OB History   Grav Para Term Preterm Abortions TAB SAB Ect Mult Living                 Review of Systems  Constitutional: Negative for fever, diaphoresis, appetite change, fatigue and unexpected weight change.  HENT: Negative for  mouth sores.   Eyes: Negative for visual disturbance.  Respiratory: Negative for cough, chest tightness, shortness of breath and wheezing.   Cardiovascular: Negative for chest pain.  Gastrointestinal: Negative for nausea, vomiting, abdominal pain, diarrhea and constipation.  Endocrine: Negative for polydipsia, polyphagia and polyuria.  Genitourinary: Negative for dysuria, urgency, frequency and hematuria.  Musculoskeletal: Negative for back pain and neck stiffness.  Skin: Negative for rash.  Allergic/Immunologic: Negative for immunocompromised state.  Neurological: Negative for syncope, light-headedness and headaches.  Hematological: Does not bruise/bleed easily.  Psychiatric/Behavioral: Positive for dysphoric mood. Negative for sleep disturbance. The patient is not nervous/anxious.       Allergies  Dextromethorphan  Home Medications   Prior to Admission medications   Medication Sig Start Date End Date Taking? Authorizing Milly Goggins  Acetaminophen-DM (TYLENOL COUGH/SORE THROAT PO) Take 2 tablets by mouth every 4 (four) hours as needed.    Historical Carren Blakley, MD  cyclobenzaprine (FLEXERIL) 10 MG tablet Take 1 tablet (10 mg total) by mouth 2 (two) times daily as needed for muscle spasms. 10/22/13   Roxy Horsemanobert Browning, PA-C  guaiFENesin (ROBITUSSIN) 100 MG/5ML liquid Take 5-10 mLs (100-200 mg total) by mouth every 4 (four) hours as needed for cough. 10/22/13   Roxy Horsemanobert Browning, PA-C  ibuprofen (ADVIL,MOTRIN) 800 MG tablet Take 1 tablet (800 mg total) by mouth 3 (three) times daily. 10/22/13   Roxy Horsemanobert Browning, PA-C   BP 117/71  Pulse 61  Temp(Src) 98 F (36.7 C) (Oral)  Resp 18  SpO2 100%  LMP 04/01/2014 Physical Exam  Nursing note and vitals reviewed. Constitutional: She appears well-developed and well-nourished. No distress.  Awake, alert, nontoxic appearance  HENT:  Head: Normocephalic and atraumatic.  Mouth/Throat: Oropharynx is clear and moist. No oropharyngeal exudate.  Eyes:  Conjunctivae are normal. No scleral icterus.  Neck: Normal range of motion. Neck supple.  Cardiovascular: Normal rate, regular rhythm, normal heart sounds and intact distal pulses.   No murmur heard. Pulmonary/Chest: Effort normal and breath sounds normal. No respiratory distress. She has no wheezes.  Abdominal: Soft. Bowel sounds are normal. She exhibits no mass. There is no tenderness. There is no rebound and no guarding.  Musculoskeletal: Normal range of motion. She exhibits no edema.  Neurological: She is alert.  Speech is clear and goal oriented Moves extremities without ataxia  Skin: Skin is warm and dry. She is not diaphoretic.  Psychiatric: She is not actively hallucinating. She exhibits a depressed mood. She expresses no homicidal and no suicidal ideation. She expresses no suicidal plans and no homicidal plans.    ED Course  Procedures (including critical care time) Labs Review Labs Reviewed - No data to display  Imaging Review No results found.   EKG Interpretation None      MDM   Final diagnoses:  Substance abuse  Depression  Mood disorder  ADD (attention deficit disorder)   [name] presents with complaints of heroin and opiate abuse with only occasional EtOH use.  She denies SI/HI, auditory or visual hallucinations.  Discussed with patient and friend our lack of resources for opiate detox in the ED and recommended a number of outpatient resources.  Pt's friend states [facility], but they were busy and thought that the process might be faster here.  They report that they will return [facility] morning for evaluation and treatment.    I have personally reviewed patient's vitals, nursing note.  At this time, it has been determined that no acute conditions requiring further emergency intervention. The patient/guardian have been advised of the diagnosis and plan. I reviewed all labs and imaging including any potential incidental findings. We  have discussed signs and symptoms that warrant return to the ED, such as SI, overdose, CP or other concerning symptoms.  Patient/guardian has voiced understanding and agreed to follow-up with the resources listed today.  Vital signs are stable at discharge.   BP 117/71  Pulse 61  Temp(Src) 98 F (36.7 C) (Oral)  Resp 18  SpO2 100%  LMP 04/01/2014        Dierdre ForthHannah Muthersbaugh, PA-C 04/25/14 1158

## 2014-04-25 NOTE — Discharge Instructions (Signed)
1. Medications: usual home medications 2. Treatment: rest, drink plenty of fluids,  3. Follow Up: Please use the resource list provided to find an outpatient resource for rehab        Behavioral Health Resources in the [facility]  Intensive Outpatient Programs: [facility]      [address] [place], [place] [phone] Both a day and evening program       [facility]     [address]        [place], [place] [address] [phone]         [facility]: [facility] [address] [place] [phone]  [facility] ACCESS LINE: [phone] or [phone] [address] [place], [place] [address] [email or web address]  Mobile Crisis Teams:                                        [facility]         Mobile Crisis Care Unit [phone]             [facility] [address] [place] [phone]                                         Interventionist [address]haron DeEsch [address] [place] [phone]  Self-Help/Support Groups: Mental Health Assoc[facility] of [place] Variety of support groups [phone] (call for more info)  Narcotics Anonymous (NA) [facility] [address] [place] - 2 meetings at this location  Residential Treatment Programs:  [facility]      [address]        [place]       [phone]         [facility] [address], [place] [address] [place], [place]  [address] [phone]  [facility]  [address] [place], [place] [address] [phone] Admissions: 8am-3pm M-F  [facility]     [address]        [place], [place] [address]       [phone]         [facility] [address] [place], [place] [phone]  [facility] [address] [place], [place] [phone]  [facility] - Intensive Outpatient      [address]     [place], [place]       [phone]         [facility]     [address] [place], [place] [phone] or [phone]  [facility] ([facility])  [address] [place], [place] [phone]  [facility] [address]                                               [address] [place] [phone]  [facility] Resources: [phone]               General Therapy  Julie Brannon, PhD        [address]                                       [place], [place] [address]         [phone]   Insurance  [facility]   [address] [place], [place] [address] [phone]  [facility] [address] [place], [place] 2Angie Fava[address] [phone] Insurance/Medicaid/sponsorship through [facility]  Faith and Families                                              [address]                                        [place], [place] [address]    Therapy/tele-psych/case         [phone]          [facility] [address].   [place], [place]  [address]  Adolescent/group home/case management [phone]                                           Creola CornJulia Brannon PhD       General therapy       Insurance   [phone]         Dr. Lolly MustacheArfeen Insurance [phone] M-F  [facility] Medicaid, sponsorship [phone]

## 2014-04-25 NOTE — Progress Notes (Signed)
[facility] did not get to see patient but will be sending information about [facility] Card program to help patient establish primary care, using address provided.

## 2014-04-25 NOTE — ED Notes (Addendum)
Pt here for detox from heroine and opiates.

## 2014-04-26 NOTE — ED Provider Notes (Signed)
Medical screening examination/treatment/procedure(s) were performed by non-physician practitioner and as supervising physician I was immediately available for consultation/collaboration.   EKG Interpretation None       Shacoya Burkhammer, MD 04/26/14 0802 

## 2014-09-04 ENCOUNTER — Ambulatory Visit (INDEPENDENT_AMBULATORY_CARE_PROVIDER_SITE_OTHER): Payer: Managed Care, Other (non HMO) | Admitting: Internal Medicine

## 2014-09-04 ENCOUNTER — Encounter: Payer: Self-pay | Admitting: Internal Medicine

## 2014-09-04 VITALS — BP 122/80 | HR 73 | Temp 98.6°F | Resp 16 | Ht 63.75 in | Wt 136.6 lb

## 2014-09-04 DIAGNOSIS — F909 Attention-deficit hyperactivity disorder, unspecified type: Secondary | ICD-10-CM

## 2014-09-04 DIAGNOSIS — F988 Other specified behavioral and emotional disorders with onset usually occurring in childhood and adolescence: Secondary | ICD-10-CM

## 2014-09-04 DIAGNOSIS — F329 Major depressive disorder, single episode, unspecified: Secondary | ICD-10-CM

## 2014-09-04 DIAGNOSIS — F341 Dysthymic disorder: Secondary | ICD-10-CM

## 2014-09-04 MED ORDER — BUPROPION HCL ER (XL) 150 MG PO TB24: 150.0000 mg | ORAL_TABLET | Freq: Every day | ORAL | Status: DC

## 2014-09-04 MED ORDER — ATOMOXETINE HCL 100 MG PO CAPS: 100.0000 mg | ORAL_CAPSULE | Freq: Every day | ORAL | Status: DC

## 2014-09-04 MED ORDER — ATOMOXETINE HCL 40 MG PO CAPS: 40.0000 mg | ORAL_CAPSULE | Freq: Every day | ORAL | Status: DC

## 2014-09-04 NOTE — Progress Notes (Addendum)
Subjective:  This chart was scribed for Cassandra Pearsonobert P Zora Glendenning, MD by Charline BillsEssence Howell, ED Scribe. The patient was seen in room 27. Patient's care was started at 11:33 AM.   Patient ID: Cassandra Roberts, female    DOB: Mar 12, 1991, 23 y.o.   MRN: [id number]  Chief Complaint  Patient presents with  . ADHD   HPI HPI Comments: Cassandra BussingChrista Roberts is a 23 y.o. female, with a h/o ADD and depression, who presents to the [facility] to discuss ADD. Pt states that she no longer wishes to take Adderall. She reports more difficulty focusing. She states that she has to read the same article multiple times to comprehend the material. She also reports difficulty staying motivated. She states that she has a lot of ups and downs, which she describes as extremes. She states that she wakes up sometimes and feels as if she does not want to go anywhere. She denies frequent anxiety at this time. Pt also reports an increase in stress that she attributes to recent change in her life, specifically stopping drug use. She states "I feel stuck" and has placed nursing school on hold. Pt has aspirations of becoming a singer but states that she does not know how to get there. She denies sleep disturbances.   Recovery [facility] and depression on [facility]/14. Following discharge, pt [facility] [place]. Pt was housed there for approximately 1 month. She has the opportunity to return if in a crisis. Already relapsed once and had retreatment-successful.  Support Pt reports that her sister is a source of support. She states that she does not receive support from her parents. She also reports that she does not have any friends now since her old friends were all users. Pt plans to start going to her sister's therapist [place].   Note past med care here since child and problems w/ family discord--Mom w/ marked psych probs  Past Medical History    Diagnosis Date  . Attention deficit disorder (ADD)-dx childhood    Current Outpatient Prescriptions on File Prior to Visit  Medication Sig Dispense Refill  . ibuprofen (ADVIL,MOTRIN) 800 MG tablet Take 1 tablet (800 mg total) by mouth 3 (three) times daily.  21 tablet  0  . Biotin 1000 MCG tablet Take 1,000 mcg by mouth daily.      . Cranberry 250 MG TABS Take 2 tablets by mouth daily.      . Prenatal Vit-Fe Fumarate-FA (PRENATAL MULTIVITAMIN) TABS tablet Take 1 tablet by mouth daily at 12 noon.      . vitamin B-12 (CYANOCOBALAMIN) 100 MCG tablet Take 100 mcg by mouth daily.       No current facility-administered medications on file prior to visit.   Allergies  Allergen Reactions  . Dextromethorphan     Shaking   Review of Systems  Constitutional: Negative for fever, chills and fatigue.  Psychiatric/Behavioral: Positive for decreased concentration. Negative for sleep disturbance. The patient is not nervous/anxious.       Objective:   Physical Exam  Nursing note and vitals reviewed. Constitutional: She is oriented to person, place, and time. She appears well-developed and well-nourished. No distress.  HENT:  Head: Normocephalic and atraumatic.  Eyes: Conjunctivae and EOM are normal.  Neck: Neck supple.  Pulmonary/Chest: Effort normal. No respiratory distress.  Musculoskeletal: Normal range of motion.  Neurological: She is alert and oriented to person, place,  and time.  Skin: Skin is warm and dry.  Psychiatric: She has a normal mood and affect. Her behavior is normal.  a little apprehensive about lack of direction Triage Vitals: BP 122/80  Pulse 73  Temp(Src) 98.6 F (37 C) (Oral)  Resp 16  Ht 5' 3.75" (1.619 m)  Wt 136 lb 9.6 oz (61.961 kg)  BMI 23.64 kg/m2  SpO2 100%  LMP 08/29/2014    Assessment & Plan:  Reactive depr--start wellbutrin(minimal anx and no insomnia) ADD-start strattera Arrested development--discussed alternatives for her--counseling will approach  this(disc [facility]-music and [place]) Sub ab--stable  Meds ordered this encounter  Medications  . buPROPion (WELLBUTRIN XL) 150 MG 24 hr tablet    Sig: Take 1 tablet (150 mg total) by mouth daily. Increase to 2 tabs each morning after 1 week if no problems    Dispense:  60 tablet    Refill:  0  . atomoxetine (STRATTERA) 40 MG capsule    Sig: Take 1 capsule (40 mg total) by mouth daily. After 3 days use 2 tabs a day at HS. After 2 weeks change to 100mg  tabs    Dispense:  30 capsule    Refill:  0  . atomoxetine (STRATTERA) 100 MG capsule    Sig: Take 1 capsule (100 mg total) by mouth daily.    Dispense:  30 capsule    Refill:  0   F/u 3wk mycht  I personally performed the services described in this documentation, which was scribed in my presence. The recorded information has been reviewed and is accurate.

## 2014-09-04 NOTE — Patient Instructions (Signed)
40 mg/day, increased after minimum of 3 days to 80 mg/day; may administer as either a single daily dose or 2 evenly divided doses in morning and late afternoon/early evening. May increase to 100 mg/day in 2 additional weeks to achieve optimal response. Maximum daily dose: 100 mg/day.

## 2014-09-25 ENCOUNTER — Ambulatory Visit: Payer: Managed Care, Other (non HMO) | Admitting: Internal Medicine

## 2015-03-18 ENCOUNTER — Other Ambulatory Visit: Payer: Self-pay | Admitting: Internal Medicine

## 2015-03-18 NOTE — Telephone Encounter (Signed)
Pt cancelled appt with you for follow up. Did you want me to refill this?

## 2015-06-14 ENCOUNTER — Emergency Department (HOSPITAL_COMMUNITY)
Admission: EM | Admit: 2015-06-14 | Discharge: 2015-06-15 | Disposition: A | Discharge status: Home / Self Care | Payer: Managed Care, Other (non HMO) | Attending: Emergency Medicine | Admitting: Emergency Medicine

## 2015-06-14 ENCOUNTER — Emergency Department (HOSPITAL_COMMUNITY): Payer: Managed Care, Other (non HMO)

## 2015-06-14 ENCOUNTER — Encounter (HOSPITAL_COMMUNITY): Payer: Self-pay | Admitting: Emergency Medicine

## 2015-06-14 DIAGNOSIS — Z720 Tobacco use: Secondary | ICD-10-CM | POA: Insufficient documentation

## 2015-06-14 DIAGNOSIS — Y998 Other external cause status: Secondary | ICD-10-CM | POA: Insufficient documentation

## 2015-06-14 DIAGNOSIS — T401X1A Poisoning by heroin, accidental (unintentional), initial encounter: Secondary | ICD-10-CM | POA: Diagnosis present

## 2015-06-14 DIAGNOSIS — Z8619 Personal history of other infectious and parasitic diseases: Secondary | ICD-10-CM | POA: Insufficient documentation

## 2015-06-14 DIAGNOSIS — Z79899 Other long term (current) drug therapy: Secondary | ICD-10-CM | POA: Diagnosis not present

## 2015-06-14 DIAGNOSIS — Z3202 Encounter for pregnancy test, result negative: Secondary | ICD-10-CM | POA: Diagnosis not present

## 2015-06-14 DIAGNOSIS — F909 Attention-deficit hyperactivity disorder, unspecified type: Secondary | ICD-10-CM | POA: Insufficient documentation

## 2015-06-14 DIAGNOSIS — Y9389 Activity, other specified: Secondary | ICD-10-CM | POA: Diagnosis not present

## 2015-06-14 DIAGNOSIS — Y92481 Parking lot as the place of occurrence of the external cause: Secondary | ICD-10-CM | POA: Insufficient documentation

## 2015-06-14 HISTORY — DX: Unspecified viral hepatitis C without hepatic coma: B19.20

## 2015-06-14 LAB — I-STAT CHEM 8, ED
BUN: 10 mg/dL (ref 6–20)
CALCIUM ION: 1.13 mmol/L (ref 1.12–1.23)
Chloride: 103 mmol/L (ref 101–111)
Creatinine, Ser: 0.7 mg/dL (ref 0.44–1.00)
GLUCOSE: 86 mg/dL (ref 65–99)
HEMATOCRIT: 44 % (ref 36.0–46.0)
HEMOGLOBIN: 15 g/dL (ref 12.0–15.0)
Potassium: 4 mmol/L (ref 3.5–5.1)
SODIUM: 137 mmol/L (ref 135–145)
TCO2: 20 mmol/L (ref 0–100)

## 2015-06-14 LAB — CBC WITH DIFFERENTIAL/PLATELET
Basophils Absolute: 0 10*3/uL (ref 0.0–0.1)
Basophils Relative: 0 % (ref 0–1)
Eosinophils Absolute: 0 10*3/uL (ref 0.0–0.7)
Eosinophils Relative: 0 % (ref 0–5)
HEMATOCRIT: 41.1 % (ref 36.0–46.0)
HEMOGLOBIN: 14 g/dL (ref 12.0–15.0)
Lymphocytes Relative: 8 % — ABNORMAL LOW (ref 12–46)
Lymphs Abs: 1.6 10*3/uL (ref 0.7–4.0)
MCH: 31.5 pg (ref 26.0–34.0)
MCHC: 34.1 g/dL (ref 30.0–36.0)
MCV: 92.4 fL (ref 78.0–100.0)
MONOS PCT: 7 % (ref 3–12)
Monocytes Absolute: 1.3 10*3/uL — ABNORMAL HIGH (ref 0.1–1.0)
NEUTROS ABS: 15.6 10*3/uL — AB (ref 1.7–7.7)
Neutrophils Relative %: 85 % — ABNORMAL HIGH (ref 43–77)
PLATELETS: 287 10*3/uL (ref 150–400)
RBC: 4.45 MIL/uL (ref 3.87–5.11)
RDW: 14 % (ref 11.5–15.5)
WBC: 18.5 10*3/uL — AB (ref 4.0–10.5)

## 2015-06-14 LAB — I-STAT BETA HCG BLOOD, ED (MC, WL, AP ONLY): I-stat hCG, quantitative: <5 m[IU]/mL (ref <5)

## 2015-06-14 MED ORDER — SODIUM CHLORIDE 0.9 % IV SOLN
1000.0000 mL | INTRAVENOUS | Status: DC
  Administered 2015-06-15: 1000 mL via INTRAVENOUS

## 2015-06-14 MED ORDER — SODIUM CHLORIDE 0.9 % IV SOLN
1000.0000 mL | Freq: Once | INTRAVENOUS | Status: AC
  Administered 2015-06-14: 1000 mL via INTRAVENOUS

## 2015-06-14 NOTE — ED Notes (Signed)
Bed: RESA Expected date:  Expected time:  Means of arrival:  Comments: 42F heroin OD/crack use, Narcan 2IM/2IV given

## 2015-06-14 NOTE — ED Notes (Addendum)
Pt reports she hasn't eaten in a few days. PT reports that she "smoke crack " last night. She reports she use to use heroin frequently. Pt calm, cooperative and oriented.

## 2015-06-14 NOTE — ED Notes (Signed)
Pt transported from parking lot of [employer], snorting heroin and using crack. Friend noticed friend was not breathing, pulled her from car and began compressions. EMS arrived, pt apneic, BVM used, intranasal narcan given, IV est Narcan  IVP. #18 R AC, Pt A & O, CBG 94

## 2015-06-14 NOTE — ED Provider Notes (Signed)
CSN: [id number]     Arrival date & time 06/14/15  2247 History  This chart was scribed for Sundai Probert, MD by Doreatha Martin, ED Scribe. This patient was seen in room RESA/RESA and the patient's care was started at 11:08 PM.     Chief Complaint  Patient presents with  . Drug Overdose   Patient is a 24 y.o. female presenting with Overdose. The history is provided by the patient. No language interpreter was used.  Drug Overdose This is a new problem. The current episode started 3 to 5 hours ago. The problem occurs constantly. The problem has been rapidly improving. Pertinent negatives include no chest pain, no abdominal pain, no headaches and no shortness of breath. Nothing aggravates the symptoms. Nothing relieves the symptoms. She has tried nothing for the symptoms. The treatment provided mild relief.    HPI Comments: Cassandra Roberts is a 24 y.o. female who presents to the Emergency Department complaining of moderate overdose onset 3 hours ago. She notes that last night she drank a beer, had 3 shots of liquor, smoked crack and used heroin when the overdose occurred. Her friend noticed that she was unresponsive and called for help. She states that she has been a frequent heroin user in the past, but not recently. She is not on any other medications. Pt states she has not eaten in 2 days. LNMP one month ago. She denies taking Tylenol, ASA, ibuprofen. She also denies SI, abdominal pain.  Past Medical History  Diagnosis Date  . Attention deficit disorder (ADD)   . Hepatitis C    Past Surgical History  Procedure Laterality Date  . Appendectomy     No family history on file. History  Substance Use Topics  . Smoking status: Current Every Day Smoker -- 0.25 packs/day for 2 years    Types: Cigarettes  . Smokeless tobacco: Never Used  . Alcohol Use: Yes     Comment: daily   OB History    No data available     Review of Systems  HENT: Negative for trouble swallowing.   Respiratory: Negative  for shortness of breath.   Cardiovascular: Negative for chest pain.  Gastrointestinal: Negative for abdominal pain.  Skin: Negative for rash.  Neurological: Positive for syncope. Negative for headaches.  Psychiatric/Behavioral: Negative for suicidal ideas.  All other systems reviewed and are negative.  Allergies  Dextromethorphan  Home Medications   Prior to Admission medications   Medication Sig Start Date End Date Taking? Authorizing Provider  atomoxetine (STRATTERA) 100 MG capsule Take 1 capsule (100 mg total) by mouth daily. 09/04/14   Tonye Pearson, MD  atomoxetine (STRATTERA) 40 MG capsule Take 1 capsule (40 mg total) by mouth daily. After 3 days use 2 tabs a day at HS. After 2 weeks change to  tabs 09/04/14   Tonye Pearson, MD  Biotin 1000 MCG tablet Take 1,000 mcg by mouth daily.    Historical Provider, MD  buPROPion (WELLBUTRIN XL) 150 MG 24 hr tablet TAKE 1 TABLET BY MOUTH DAILY FOR 1 WEEK, THEN INCREASE TO 2 TABLETS EACH MORNING 03/18/15   Tonye Pearson, MD  Cranberry 250 MG TABS Take 2 tablets by mouth daily.    Historical Provider, MD  ibuprofen (ADVIL,MOTRIN) 800 MG tablet Take 1 tablet (800 mg total) by mouth 3 (three) times daily. 10/22/13   Roxy Horseman, PA-C  Prenatal Vit-Fe Fumarate-FA (PRENATAL MULTIVITAMIN) TABS tablet Take 1 tablet by mouth daily at 12 noon.  Historical Provider, MD  vitamin B-12 (CYANOCOBALAMIN) 100 MCG tablet Take 100 mcg by mouth daily.    Historical Provider, MD   BP 131/67 mmHg  Pulse 96  Temp(Src) 98.7 F (37.1 C) (Oral)  Resp 16  SpO2 96% Physical Exam  Constitutional: She is oriented to person, place, and time. She appears well-developed and well-nourished. No distress.  HENT:  Head: Normocephalic and atraumatic.  Mouth/Throat: Oropharynx is clear and moist. No oropharyngeal exudate.  Eyes: Conjunctivae and EOM are normal. Pupils are equal, round, and reactive to light.  Neck: Normal range of motion. Neck  supple. No tracheal deviation present.  Cardiovascular: Normal rate, regular rhythm, normal heart sounds and intact distal pulses.  Exam reveals no gallop and no friction rub.   No murmur heard. Pulmonary/Chest: Effort normal and breath sounds normal. No stridor. No respiratory distress. She has no wheezes. She has no rales. She exhibits no tenderness.  Lungs CTA  Abdominal: Soft. Bowel sounds are normal. She exhibits no distension. There is no tenderness. There is no rebound and no guarding.  Musculoskeletal: Normal range of motion.  Lymphadenopathy:    She has no cervical adenopathy.  Neurological: She is alert and oriented to person, place, and time. She has normal reflexes.  Skin: Skin is warm and dry.  No lesions. No crepitus or deformity of the sternum.   Psychiatric: She has a normal mood and affect. Her behavior is normal.  Nursing note and vitals reviewed.  ED Course  Procedures (including critical care time) DIAGNOSTIC STUDIES: Oxygen Saturation is 96% on RA, normal by my interpretation.    COORDINATION OF CARE: 11:12 PM Discussed treatment plan with pt at bedside and pt agreed to plan.   Labs Review Labs Reviewed - No data to display  Imaging Review No results found.   EKG Interpretation None      EKG Interpretation  Date/Time:  Saturday June 14 2015 23:13:31 EDT Ventricular Rate:  88 PR Interval:  132 QRS Duration: 93 QT Interval:  377 QTC Calculation: 456 R Axis:   54 Text Interpretation:  Sinus rhythm Confirmed by [facility]  MD, Morene Antu ([id number]) on 06/14/2015 11:15:20 PM       MDM   Final diagnoses:  None   Will observe for 4 hours, have advised no more narcotics. Patient verbalizes understanding.  Will d/c with narcan  I personally performed the services described in this documentation, which was scribed in my presence. The recorded information has been reviewed and is accurate.     Cy Blamer, MD 06/15/15 [phone]

## 2015-06-15 LAB — COMPREHENSIVE METABOLIC PANEL
ALK PHOS: 74 U/L (ref 38–126)
ALT: 46 U/L (ref 14–54)
AST: 24 U/L (ref 15–41)
Albumin: 4.6 g/dL (ref 3.5–5.0)
Anion gap: 14 (ref 5–15)
BUN: 10 mg/dL (ref 6–20)
CHLORIDE: 104 mmol/L (ref 101–111)
CO2: 20 mmol/L — ABNORMAL LOW (ref 22–32)
CREATININE: 0.69 mg/dL (ref 0.44–1.00)
Calcium: 9.2 mg/dL (ref 8.9–10.3)
GFR calc Af Amer: >60 mL/min (ref >60)
Glucose, Bld: 87 mg/dL (ref 65–99)
POTASSIUM: 4.1 mmol/L (ref 3.5–5.1)
Sodium: 138 mmol/L (ref 135–145)
TOTAL PROTEIN: 7.7 g/dL (ref 6.5–8.1)
Total Bilirubin: 0.9 mg/dL (ref 0.3–1.2)

## 2015-06-15 LAB — CBG MONITORING, ED: GLUCOSE-CAPILLARY: 77 mg/dL (ref 65–99)

## 2015-06-15 LAB — ACETAMINOPHEN LEVEL

## 2015-06-15 LAB — SALICYLATE LEVEL

## 2015-06-15 LAB — ETHANOL: Alcohol, Ethyl (B): 31 mg/dL — ABNORMAL HIGH (ref <5)

## 2015-06-15 MED ORDER — NALOXONE HCL 1 MG/ML IJ SOLN: 0.4000 mg | INTRAMUSCULAR | Status: AC | PRN

## 2015-06-15 NOTE — ED Notes (Signed)
Pt talking on phone. Pt is tearful.

## 2015-06-15 NOTE — Discharge Instructions (Signed)
Accidental Overdose  A drug overdose occurs when a chemical substance (drug or medication) is used in amounts large enough to overcome a person. This may result in severe illness or death. This is a type of poisoning. Accidental overdoses of medications or other substances come from a variety of reasons. When this happens accidentally, it is often because the person taking the substance does not know enough about what they have taken. Drugs which commonly cause overdose deaths are alcohol, psychotropic medications (medications which affect the mind), pain medications, illegal drugs (street drugs) such as cocaine and heroin, and multiple drugs taken at the same time. It may result from careless behavior (such as over-indulging at a party). Other causes of overdose may include multiple drug use, a lapse in memory, or drug use after a period of no drug use.   Sometimes overdosing occurs because a person cannot remember if they have taken their medication.   A common unintentional overdose in young children involves multi-vitamins containing iron. Iron is a part of the hemoglobin molecule in blood. It is used to transport oxygen to living cells. When taken in small amounts, iron allows the body to restock hemoglobin. In large amounts, it causes problems in the body. If this overdose is not treated, it can lead to death.  Never take medicines that show signs of tampering or do not seem quite right. Never take medicines in the dark or in poor lighting. Read the label and check each dose of medicine before you take it. When adults are poisoned, it happens most often through carelessness or lack of information. Taking medicines in the dark or taking medicine prescribed for someone else to treat the same type of problem is a dangerous practice.  SYMPTOMS   Symptoms of overdose depend on the medication and amount taken. They can vary from over-activity with stimulant over-dosage, to sleepiness from depressants such as  alcohol, narcotics and tranquilizers. Confusion, dizziness, nausea and vomiting may be present. If problems are severe enough coma and death may result.  DIAGNOSIS   Diagnosis and management are generally straightforward if the drug is known. Otherwise it is more difficult. At times, certain symptoms and signs exhibited by the patient, or blood tests, can reveal the drug in question.   TREATMENT   In an emergency department, most patients can be treated with supportive measures. Antidotes may be available if there has been an overdose of opioids or benzodiazepines. A rapid improvement will often occur if this is the cause of overdose.  At home or away from medical care:   There may be no immediate problems or warning signs in children.   Not everything works well in all cases of poisoning.   Take immediate action. Poisons may act quickly.   If you think someone has swallowed medicine or a household product, and the person is unconscious, having seizures (convulsions), or is not breathing, immediately call for an ambulance.  IF a person is conscious and appears to be doing OK but has swallowed a poison:   Do not wait to see what effect the poison will have. Immediately call a poison control center (listed in the white pages of your telephone book under "Poison Control" or inside the front cover with other emergency numbers). Some poison control centers have TTY capability for the deaf. Check with your local center if you or someone in your family requires this service.   Keep the container so you can read the label on the product for ingredients.     Describe what, when, and how much was taken and the age and condition of the person poisoned. Inform them if the person is vomiting, choking, drowsy, shows a change in color or temperature of skin, is conscious or unconscious, or is convulsing.   Do not cause vomiting unless instructed by medical personnel. Do not induce vomiting or force liquids into a person who  is convulsing, unconscious, or very drowsy.  Stay calm and in control.    Activated charcoal also is sometimes used in certain types of poisoning and you may wish to add a supply to your emergency medicines. It is available without a prescription. Call a poison control center before using this medication.  PREVENTION   Thousands of children die every year from unintentional poisoning. This may be from household chemicals, poisoning from carbon monoxide in a car, taking their parent's medications, or simply taking a few iron pills or vitamins with iron. Poisoning comes from unexpected sources.   Store medicines out of the sight and reach of children, preferably in a locked cabinet. Do not keep medications in a food cabinet. Always store your medicines in a secure place. Get rid of expired medications.   If you have children living with you or have them as occasional guests, you should have child-resistant caps on your medicine containers. Keep everything out of reach. Child proof your home.   If you are called to the telephone or to answer the door while you are taking a medicine, take the container with you or put the medicine out of the reach of small children.   Do not take your medication in front of children. Do not tell your child how good a medication is and how good it is for them. They may get the idea it is more of a treat.   If you are an adult and have accidentally taken an overdose, you need to consider how this happened and what can be done to prevent it from happening again. If this was from a street drug or alcohol, determine if there is a problem that needs addressing. If you are not sure a problems exists, it is easy to talk to a professional and ask them if they think you have a problem. It is better to handle this problem in this way before it happens again and has a much worse consequence.  Document Released: 01/08/2005 Document Revised: 01/17/2012 Document Reviewed: 06/16/2009  [employer]  Patient Information 2015 [employer]. This information is not intended to replace advice given to you by your health care provider. Make sure you discuss any questions you have with your health care provider.

## 2015-06-15 NOTE — ED Notes (Signed)
Pt ambulated to restroom with steady gait. Pt unable to provide a urine sample.

## 2015-06-15 NOTE — ED Notes (Signed)
Patient is using the bathroom.

## 2015-06-15 NOTE — ED Notes (Signed)
Patient is resting comfortably in room.

## 2015-06-15 NOTE — ED Notes (Signed)
Pt reports that she is still unable to urinate. Pt spent 20 minutes in restroom. She states "i can't pee I think its the heroin".

## 2015-07-02 ENCOUNTER — Emergency Department (HOSPITAL_BASED_OUTPATIENT_CLINIC_OR_DEPARTMENT_OTHER)
Admission: EM | Admit: 2015-07-02 | Discharge: 2015-07-02 | Disposition: A | Discharge status: Home / Self Care | Payer: Managed Care, Other (non HMO) | Attending: Emergency Medicine | Admitting: Emergency Medicine

## 2015-07-02 ENCOUNTER — Emergency Department (HOSPITAL_BASED_OUTPATIENT_CLINIC_OR_DEPARTMENT_OTHER): Payer: Managed Care, Other (non HMO)

## 2015-07-02 ENCOUNTER — Encounter (HOSPITAL_BASED_OUTPATIENT_CLINIC_OR_DEPARTMENT_OTHER): Payer: Self-pay

## 2015-07-02 DIAGNOSIS — Z8659 Personal history of other mental and behavioral disorders: Secondary | ICD-10-CM | POA: Diagnosis not present

## 2015-07-02 DIAGNOSIS — Z3202 Encounter for pregnancy test, result negative: Secondary | ICD-10-CM | POA: Insufficient documentation

## 2015-07-02 DIAGNOSIS — R05 Cough: Secondary | ICD-10-CM | POA: Insufficient documentation

## 2015-07-02 DIAGNOSIS — A599 Trichomoniasis, unspecified: Secondary | ICD-10-CM

## 2015-07-02 DIAGNOSIS — N898 Other specified noninflammatory disorders of vagina: Secondary | ICD-10-CM | POA: Diagnosis not present

## 2015-07-02 DIAGNOSIS — R059 Cough, unspecified: Secondary | ICD-10-CM

## 2015-07-02 DIAGNOSIS — Z79899 Other long term (current) drug therapy: Secondary | ICD-10-CM | POA: Diagnosis not present

## 2015-07-02 DIAGNOSIS — Z720 Tobacco use: Secondary | ICD-10-CM | POA: Diagnosis not present

## 2015-07-02 LAB — WET PREP, GENITAL

## 2015-07-02 LAB — URINALYSIS, ROUTINE W REFLEX MICROSCOPIC
BILIRUBIN URINE: NEGATIVE
Glucose, UA: NEGATIVE mg/dL
HGB URINE DIPSTICK: NEGATIVE
Ketones, ur: NEGATIVE mg/dL
Nitrite: NEGATIVE
PROTEIN: NEGATIVE mg/dL
Specific Gravity, Urine: 1.014 (ref 1.005–1.030)
UROBILINOGEN UA: 0.2 mg/dL (ref 0.0–1.0)
pH: 7 (ref 5.0–8.0)

## 2015-07-02 LAB — URINE MICROSCOPIC-ADD ON

## 2015-07-02 LAB — PREGNANCY, URINE: PREG TEST UR: NEGATIVE

## 2015-07-02 MED ORDER — METRONIDAZOLE 500 MG PO TABS: 500.0000 mg | ORAL_TABLET | Freq: Two times a day (BID) | ORAL | Status: DC

## 2015-07-02 MED ORDER — BENZONATATE 100 MG PO CAPS: 100.0000 mg | ORAL_CAPSULE | Freq: Three times a day (TID) | ORAL | Status: DC

## 2015-07-02 MED ORDER — AZITHROMYCIN 250 MG PO TABS
1000.0000 mg | ORAL_TABLET | Freq: Once | ORAL | Status: AC
  Administered 2015-07-02: 1000 mg via ORAL
  Filled 2015-07-02: qty 4

## 2015-07-02 MED ORDER — LIDOCAINE HCL (PF) 1 % IJ SOLN
INTRAMUSCULAR | Status: AC
  Administered 2015-07-02: 5 mL
  Filled 2015-07-02: qty 5

## 2015-07-02 MED ORDER — CEFTRIAXONE SODIUM 250 MG IJ SOLR
250.0000 mg | Freq: Once | INTRAMUSCULAR | Status: AC
  Administered 2015-07-02: 250 mg via INTRAMUSCULAR
  Filled 2015-07-02: qty 250

## 2015-07-02 NOTE — Discharge Instructions (Signed)
Cough, Adult  A cough is a reflex that helps clear your throat and airways. It can help heal the body or may be a reaction to an irritated airway. A cough may only last 2 or 3 weeks (acute) or may last more than 8 weeks (chronic).  CAUSES Acute cough:  Viral or bacterial infections. Chronic cough:  Infections.  Allergies.  Asthma.  Post-nasal drip.  Smoking.  Heartburn or acid reflux.  Some medicines.  Chronic lung problems (COPD).  Cancer. SYMPTOMS   Cough.  Fever.  Chest pain.  Increased breathing rate.  High-pitched whistling sound when breathing (wheezing).  Colored mucus that you cough up (sputum). TREATMENT   A bacterial cough may be treated with antibiotic medicine.  A viral cough must run its course and will not respond to antibiotics.  Your caregiver may recommend other treatments if you have a chronic cough. HOME CARE INSTRUCTIONS   Only take over-the-counter or prescription medicines for pain, discomfort, or fever as directed by your caregiver. Use cough suppressants only as directed by your caregiver.  Use a cold steam vaporizer or humidifier in your bedroom or home to help loosen secretions.  Sleep in a semi-upright position if your cough is worse at night.  Rest as needed.  Stop smoking if you smoke. SEEK IMMEDIATE MEDICAL CARE IF:   You have pus in your sputum.  Your cough starts to worsen.  You cannot control your cough with suppressants and are losing sleep.  You begin coughing up blood.  You have difficulty breathing.  You develop pain which is getting worse or is uncontrolled with medicine.  You have a fever. MAKE SURE YOU:   Understand these instructions.  Will watch your condition.  Will get help right away if you are not doing well or get worse. Document Released: 04/23/2011 Document Revised: 01/17/2012 Document Reviewed: 04/23/2011 [facility] Patient Information 2015 [place], [place]. This information is not intended  to replace advice given to you by your health care provider. Make sure you discuss any questions you have with your health care provider. Trichomoniasis Trichomoniasis is an infection caused by an organism called Trichomonas. The infection can affect both women and men. In women, the outer female genitalia and the vagina are affected. In men, the penis is mainly affected, but the prostate and other reproductive organs can also be involved. Trichomoniasis is a sexually transmitted infection (STI) and is most often passed to another person through sexual contact.  RISK FACTORS  Having unprotected sexual intercourse.  Having sexual intercourse with an infected partner. SIGNS AND SYMPTOMS  Symptoms of trichomoniasis in women include:  Abnormal gray-green frothy vaginal discharge.  Itching and irritation of the vagina.  Itching and irritation of the area outside the vagina. Symptoms of trichomoniasis in men include:   Penile discharge with or without pain.  Pain during urination. This results from inflammation of the urethra. DIAGNOSIS  Trichomoniasis may be found during a Pap test or physical exam. Your health care provider may use one of the following methods to help diagnose this infection:  Examining vaginal discharge under a microscope. For men, urethral discharge would be examined.  Testing the pH of the vagina with a test tape.  Using a vaginal swab test that checks for the Trichomonas organism. A test is available that provides results within a few minutes.  Doing a culture test for the organism. This is not usually needed. TREATMENT   You may be given medicine to fight the infection. Women should inform  their health care provider if they could be or are pregnant. Some medicines used to treat the infection should not be taken during pregnancy.  Your health care provider may recommend over-the-counter medicines or creams to decrease itching or irritation.  Your sexual partner  will need to be treated if infected. HOME CARE INSTRUCTIONS   Take medicines only as directed by your health care provider.  Take over-the-counter medicine for itching or irritation as directed by your health care provider.  Do not have sexual intercourse while you have the infection.  Women should not douche or wear tampons while they have the infection.  Discuss your infection with your partner. Your partner may have gotten the infection from you, or you may have gotten it from your partner.  Have your sex partner get examined and treated if necessary.  Practice safe, informed, and protected sex.  See your health care provider for other STI testing. SEEK MEDICAL CARE IF:   You still have symptoms after you finish your medicine.  You develop abdominal pain.  You have pain when you urinate.  You have bleeding after sexual intercourse.  You develop a rash.  Your medicine makes you sick or makes you throw up (vomit). MAKE SURE YOU:  Understand these instructions.  Will watch your condition.  Will get help right away if you are not doing well or get worse. Document Released: 04/20/2001 Document Revised: 03/11/2014 Document Reviewed: 08/06/2013 [facility] Patient Information 2015 [place], [place]. This information is not intended to replace advice given to you by your health care provider. Make sure you discuss any questions you have with your health care provider.

## 2015-07-02 NOTE — ED Notes (Signed)
Reports cough x 4-5 days. Also sts she wants to be checked for STDs. 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 Denies discharge or abdominal pain.

## 2015-07-02 NOTE — ED Provider Notes (Signed)
CSN: [id number]     Arrival date & time 07/02/15  1030 History   First MD Initiated Contact with Patient 07/02/15 1045     Chief Complaint  Patient presents with  . Cough  . Exposure to STD     (Consider location/radiation/quality/duration/timing/severity/associated sxs/prior Treatment) Patient is a 24 y.o. female presenting with cough.  Cough Cough characteristics:  Productive Sputum characteristics:  Yellow Severity:  Moderate Onset quality:  Gradual Duration:  4 days Timing:  Constant Progression:  Unchanged Chronicity:  New Smoker: no   Context: upper respiratory infection   Relieved by:  Nothing Worsened by:  Nothing tried Ineffective treatments:  None tried Associated symptoms: no chills, no fever, no rash and no shortness of breath     Past Medical History  Diagnosis Date  . Attention deficit disorder (ADD)   . Hepatitis C    Past Surgical History  Procedure Laterality Date  . Appendectomy     No family history on file. Social History  Substance Use Topics  . Smoking status: Current Every Day Smoker -- 0.25 packs/day for 2 years    Types: Cigarettes  . Smokeless tobacco: Never Used  . Alcohol Use: Yes     Comment: daily   OB History    No data available     Review of Systems  Constitutional: Negative for fever and chills.  Respiratory: Positive for cough. Negative for shortness of breath.   Skin: Negative for rash.  All other systems reviewed and are negative.     Allergies  Dextromethorphan  Home Medications   Prior to Admission medications   Medication Sig Start Date End Date Taking? Authorizing Provider  atomoxetine (STRATTERA) 100 MG capsule Take 1 capsule (100 mg total) by mouth daily. Patient not taking: Reported on 06/15/2015 09/04/14   Tonye Pearson, MD  atomoxetine (STRATTERA) 40 MG capsule Take 1 capsule (40 mg total) by mouth daily. After 3 days use 2 tabs a day at HS. After 2 weeks change to  tabs Patient not taking:  Reported on 06/15/2015 09/04/14   Tonye Pearson, MD  benzonatate (TESSALON) 100 MG capsule Take 1 capsule (100 mg total) by mouth every 8 (eight) hours. 07/02/15   Mirian Mo, MD  Biotin 1000 MCG tablet Take 1,000 mcg by mouth daily.    Historical Provider, MD  buPROPion (WELLBUTRIN XL) 150 MG 24 hr tablet TAKE 1 TABLET BY MOUTH DAILY FOR 1 WEEK, THEN INCREASE TO 2 TABLETS EACH MORNING Patient not taking: Reported on 06/15/2015 03/18/15   Tonye Pearson, MD  Cranberry 250 MG TABS Take 2 tablets by mouth daily.    Historical Provider, MD  ibuprofen (ADVIL,MOTRIN) 800 MG tablet Take 1 tablet (800 mg total) by mouth 3 (three) times daily. Patient not taking: Reported on 06/15/2015 10/22/13   Roxy Horseman, PA-C  metroNIDAZOLE (FLAGYL) 500 MG tablet Take 1 tablet (500 mg total) by mouth 2 (two) times daily. One po bid x 7 days 07/02/15   Mirian Mo, MD  naloxone [facility]) 1 MG/ML injection Inject 0.4 mLs (0.4 mg total) into the vein as needed. 06/15/15   April Palumbo, MD  Prenatal Vit-Fe Fumarate-FA (PRENATAL MULTIVITAMIN) TABS tablet Take 1 tablet by mouth daily at 12 noon.    Historical Provider, MD  vitamin B-12 (CYANOCOBALAMIN) 100 MCG tablet Take 100 mcg by mouth daily.    Historical Provider, MD   BP 119/78 mmHg  Pulse 64  Temp(Src) 98.2 F (36.8 C) (Oral)  Resp 14  Ht 5\' 2"  (1.575 m)  Wt 123 lb (55.792 kg)  BMI 22.49 kg/m2  SpO2 99%  LMP 06/25/2015 Physical Exam  Constitutional: She is oriented to person, place, and time. She appears well-developed and well-nourished.  HENT:  Head: Normocephalic and atraumatic.  Right Ear: External ear normal.  Left Ear: External ear normal.  Eyes: Conjunctivae and EOM are normal. Pupils are equal, round, and reactive to light.  Neck: Normal range of motion. Neck supple.  Cardiovascular: Normal rate, regular rhythm, normal heart sounds and intact distal pulses.   Pulmonary/Chest: Effort normal and breath sounds normal.  Abdominal: Soft.  Bowel sounds are normal. There is no tenderness.  Genitourinary: Cervix exhibits discharge (yellow). Cervix exhibits no motion tenderness and no friability.  Musculoskeletal: Normal range of motion.  Neurological: She is alert and oriented to person, place, and time.  Skin: Skin is warm and dry.  Vitals reviewed.   ED Course  Procedures (including critical care time) Labs Review Labs Reviewed  WET PREP, GENITAL - Abnormal; Notable for the following:    Yeast Wet Prep HPF POC FEW (*)    Trich, Wet Prep FEW (*)    Clue Cells Wet Prep HPF POC TOO NUMEROUS TO COUNT (*)    WBC, Wet Prep HPF POC MODERATE (*)    All other components within normal limits  URINALYSIS, ROUTINE W REFLEX MICROSCOPIC (NOT AT [facility]) - Abnormal; Notable for the following:    Leukocytes, UA TRACE (*)    All other components within normal limits  URINE MICROSCOPIC-ADD ON - Abnormal; Notable for the following:    Bacteria, UA FEW (*)    All other components within normal limits  PREGNANCY, URINE  HIV ANTIBODY (ROUTINE TESTING)  RPR  GC/CHLAMYDIA PROBE AMP ([place]) NOT AT [facility]    Imaging Review Dg Chest 2 View  07/02/2015   CLINICAL DATA:  Productive cough for 4-5 days.  Smoker.  EXAM: CHEST  2 VIEW  COMPARISON:  06/14/2015  FINDINGS: The heart size and mediastinal contours are within normal limits. Both lungs are clear. The visualized skeletal structures are unremarkable.  IMPRESSION: No active cardiopulmonary disease.   Electronically Signed   By: Charlett Nose M.D.   On: 07/02/2015 10:58   I have personally reviewed and evaluated these images and lab results as part of my medical decision-making.   EKG Interpretation None      MDM   Final diagnoses:  Cough  Trichomonas infection    24 y.o. female with pertinent PMH of hep C, prior heroin abuse now in [place] presents with primary complaint of cough 4 days. Positive sick contacts. No fevers, patient otherwise well. She has a secondary complaint of  wanting to be checked for STDs, states that she would not be in the emergency department for same and has minimal to no symptoms.  On arrival vital signs and physical exam as above. Treated empirically for GC and Chlamydia. Workup returned as above with negative chest x-ray, urine positive for trichomonas. Prescription for Flagyl written.  Discharged home in stable condition.  I have reviewed all laboratory and imaging studies if ordered as above  1. Cough   2. Trichomonas infection         Mirian Mo, MD 07/02/15 1245

## 2015-07-03 LAB — HIV ANTIBODY (ROUTINE TESTING W REFLEX): HIV SCREEN 4TH GENERATION: NONREACTIVE

## 2015-07-03 LAB — RPR: RPR: NONREACTIVE

## 2015-07-03 LAB — GC/CHLAMYDIA PROBE AMP (~~LOC~~) NOT AT ARMC
CHLAMYDIA, DNA PROBE: NEGATIVE
Neisseria Gonorrhea: NEGATIVE

## 2015-10-06 ENCOUNTER — Encounter: Payer: Self-pay | Admitting: Internal Medicine

## 2019-07-06 ENCOUNTER — Emergency Department (HOSPITAL_COMMUNITY)
Admission: EM | Admit: 2019-07-06 | Discharge: 2019-07-06 | Disposition: A | Discharge status: Home / Self Care | Payer: Managed Care, Other (non HMO) | Attending: Emergency Medicine | Admitting: Emergency Medicine

## 2019-07-06 ENCOUNTER — Encounter (HOSPITAL_COMMUNITY): Payer: Self-pay

## 2019-07-06 DIAGNOSIS — F17210 Nicotine dependence, cigarettes, uncomplicated: Secondary | ICD-10-CM | POA: Insufficient documentation

## 2019-07-06 DIAGNOSIS — Z0441 Encounter for examination and observation following alleged adult rape: Secondary | ICD-10-CM | POA: Insufficient documentation
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
## 2019-08-05 ENCOUNTER — Emergency Department (HOSPITAL_COMMUNITY)
Admission: EM | Admit: 2019-08-05 | Discharge: 2019-08-06 | Disposition: A | Discharge status: Home / Self Care | Payer: Self-pay | Attending: Emergency Medicine | Admitting: Emergency Medicine

## 2019-08-05 DIAGNOSIS — T401X1A Poisoning by heroin, accidental (unintentional), initial encounter: Secondary | ICD-10-CM | POA: Insufficient documentation

## 2019-08-05 DIAGNOSIS — F17210 Nicotine dependence, cigarettes, uncomplicated: Secondary | ICD-10-CM | POA: Insufficient documentation

## 2019-08-05 DIAGNOSIS — Z79899 Other long term (current) drug therapy: Secondary | ICD-10-CM | POA: Insufficient documentation

## 2019-08-05 LAB — COMPREHENSIVE METABOLIC PANEL
ALT: 19 U/L (ref 0–44)
AST: 15 U/L (ref 15–41)
Albumin: 3.2 g/dL — ABNORMAL LOW (ref 3.5–5.0)
Alkaline Phosphatase: 73 U/L (ref 38–126)
Anion gap: 9 (ref 5–15)
BUN: 8 mg/dL (ref 6–20)
CO2: 26 mmol/L (ref 22–32)
Calcium: 8.9 mg/dL (ref 8.9–10.3)
Chloride: 99 mmol/L (ref 98–111)
Creatinine, Ser: 0.63 mg/dL (ref 0.44–1.00)
GFR calc Af Amer: >60 mL/min (ref >60)
GFR calc non Af Amer: >60 mL/min (ref >60)
Glucose, Bld: 97 mg/dL (ref 70–99)
Potassium: 4 mmol/L (ref 3.5–5.1)
Sodium: 134 mmol/L — ABNORMAL LOW (ref 135–145)
Total Bilirubin: 0.4 mg/dL (ref 0.3–1.2)
Total Protein: 6.1 g/dL — ABNORMAL LOW (ref 6.5–8.1)

## 2019-08-05 LAB — CBC
HCT: 40.5 % (ref 36.0–46.0)
Hemoglobin: 13.1 g/dL (ref 12.0–15.0)
MCH: 29.8 pg (ref 26.0–34.0)
MCHC: 32.3 g/dL (ref 30.0–36.0)
MCV: 92 fL (ref 80.0–100.0)
Platelets: 338 10*3/uL (ref 150–400)
RBC: 4.4 MIL/uL (ref 3.87–5.11)
RDW: 13.2 % (ref 11.5–15.5)
WBC: 5.7 10*3/uL (ref 4.0–10.5)
nRBC: 0 % (ref 0.0–0.2)

## 2019-08-05 LAB — SALICYLATE LEVEL: Salicylate Lvl: <7 mg/dL (ref 2.8–30.0)

## 2019-08-05 LAB — ACETAMINOPHEN LEVEL: Acetaminophen (Tylenol), Serum: <10 ug/mL — ABNORMAL LOW (ref 10–30)

## 2019-08-05 NOTE — Discharge Instructions (Addendum)
Please review attachment. Get established with a primary care doctor. I have given you a number to call to schedule an appointment. Please return to the ER if you have chest pain or shortness of breath.  RESOURCE GUIDE       Behavioral Health Resources in the [facility]  Intensive Outpatient Programs: [facility]      [address] [place], [place] [phone] Both a day and evening program       [facility] Outpatient     [address]        [place], [place] [address] [phone]         ADS: [facility] [address] [place] [phone]  [facility] ACCESS LINE: [phone] or [phone] [address] [place], [place] [address] [email or web address]   Substance Abuse Resources: [facility]  [phone] [facility] [phone] [place] [phone] Floydene Flock [phone] Residential & Outpatient Substance Abuse Program  [phone]  Psychological Services: [facility]  [phone] [facility]  [phone] [facility], [phone] [place]. [address], [place], ACCESS LINE: [phone] or [phone], [email or web address]  Mobile Crisis Teams:                                        [facility]rapeutic Alternatives         Mobile Crisis Care Unit [phone]             [facility] [address] Ginette Otto [phone]                                         Interventionist [address] [place] [phone]  Self-Help/Support Groups: Mental Health Assoc[facility] of [employer] of support groups [phone] (call for more info)  Narcotics Anonymous (NA) [facility] [address] [place] - 2 meetings at this location  Residential Treatment Programs:  [facility]           [phone]        [place]       [phone]         [facility] [address] [place] [address] [place], [place]  [address] [phone]  [facility]  [address] [place], [place] [address] [phone] Admissions: 8am-3pm M-F  [facility]     [address]        [place], [place] [address]       [phone]         [facility] [address] [place], [place] [phone]  [facility] [address] [place], [place] [phone]  [facility] - Intensive Outpatient      [address]     [place], [place]       [phone]         [facility] ([facility]     [address] [place], [place] [phone] or [phone]  Residential Treatment Services (RTS), Medicaid [address] [place], [place] [phone]  [facility]  [address] [place] [phone]  [facility] Resources: [phone]- [phone]               General Therapy                                                Angie FavaJulie Brannon, PhD        [address]                                       [place], [place] [address]         [phone]   Insurance  [facility]   [address] [place], [place] [address] [phone]  [facility] [address] [place], [place] [phone] Insurance/Medicaid/sponsorship through [facility]  [facility] Families                                              [address]                                        [place], [place] [address]    Therapy/tele-psych/case         [phone]          [facility] [address]   [place], [place]  [phone]  Adolescent/group home/case management [phone]                                           Creola CornJulia Brannon PhD       General  therapy       Insurance   [phone]         Dr. Lolly MustacheArfeen, Insurance, M-F [phone]- [phone]  [facility]  [employer] [facility] [address]                 [address]         [address] [place] [address]  [place]                                               [place]                              [place] Phone:  [phone]                                  Phone:  [phone]                   Phone:  [phone]  [facility], [phone] [address] [facility]- [phone]       -     [facility] in [place], [address]             [phone], Insurance
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
## 2019-08-06 NOTE — ED Notes (Signed)
Pt discharged from ED; instructions provided; Pt encouraged to return to ED if symptoms worsen and to f/u with PCP; Pt verbalized understanding of all instructions 

## 2019-12-28 ENCOUNTER — Encounter (HOSPITAL_COMMUNITY): Payer: Self-pay | Admitting: Emergency Medicine

## 2019-12-28 ENCOUNTER — Other Ambulatory Visit: Payer: Self-pay

## 2019-12-28 ENCOUNTER — Emergency Department (HOSPITAL_COMMUNITY)
Admission: EM | Admit: 2019-12-28 | Discharge: 2019-12-28 | Disposition: A | Discharge status: Home / Self Care | Payer: Self-pay | Attending: Emergency Medicine | Admitting: Emergency Medicine

## 2019-12-28 DIAGNOSIS — Z79899 Other long term (current) drug therapy: Secondary | ICD-10-CM | POA: Insufficient documentation

## 2019-12-28 DIAGNOSIS — L02419 Cutaneous abscess of limb, unspecified: Secondary | ICD-10-CM

## 2019-12-28 DIAGNOSIS — L02414 Cutaneous abscess of left upper limb: Secondary | ICD-10-CM | POA: Insufficient documentation

## 2019-12-28 DIAGNOSIS — L03119 Cellulitis of unspecified part of limb: Secondary | ICD-10-CM

## 2019-12-28 DIAGNOSIS — F17210 Nicotine dependence, cigarettes, uncomplicated: Secondary | ICD-10-CM | POA: Insufficient documentation

## 2019-12-28 LAB — CBC WITH DIFFERENTIAL/PLATELET
Abs Immature Granulocytes: 0.03 10*3/uL (ref 0.00–0.07)
Basophils Absolute: 0 10*3/uL (ref 0.0–0.1)
Basophils Relative: 0 %
Eosinophils Absolute: 0.2 10*3/uL (ref 0.0–0.5)
Eosinophils Relative: 1 %
HCT: 43.9 % (ref 36.0–46.0)
Hemoglobin: 14.1 g/dL (ref 12.0–15.0)
Immature Granulocytes: 0 %
Lymphocytes Relative: 20 %
Lymphs Abs: 2.2 10*3/uL (ref 0.7–4.0)
MCH: 28.7 pg (ref 26.0–34.0)
MCHC: 32.1 g/dL (ref 30.0–36.0)
MCV: 89.2 fL (ref 80.0–100.0)
Monocytes Absolute: 0.8 10*3/uL (ref 0.1–1.0)
Monocytes Relative: 8 %
Neutro Abs: 7.5 10*3/uL (ref 1.7–7.7)
Neutrophils Relative %: 71 %
Platelets: 287 10*3/uL (ref 150–400)
RBC: 4.92 MIL/uL (ref 3.87–5.11)
RDW: 13.9 % (ref 11.5–15.5)
WBC: 10.7 10*3/uL — ABNORMAL HIGH (ref 4.0–10.5)
nRBC: 0 % (ref 0.0–0.2)

## 2019-12-28 LAB — URINALYSIS, ROUTINE W REFLEX MICROSCOPIC
Bilirubin Urine: NEGATIVE
Glucose, UA: NEGATIVE mg/dL
Hgb urine dipstick: NEGATIVE
Ketones, ur: NEGATIVE mg/dL
Nitrite: NEGATIVE
Protein, ur: 30 mg/dL — AB
Specific Gravity, Urine: 1.017 (ref 1.005–1.030)
Squamous Epithelial / LPF: >50 — ABNORMAL HIGH (ref 0–5)
pH: 6 (ref 5.0–8.0)

## 2019-12-28 LAB — COMPREHENSIVE METABOLIC PANEL
ALT: 78 U/L — ABNORMAL HIGH (ref 0–44)
AST: 29 U/L (ref 15–41)
Albumin: 4.3 g/dL (ref 3.5–5.0)
Alkaline Phosphatase: 95 U/L (ref 38–126)
Anion gap: 10 (ref 5–15)
BUN: 12 mg/dL (ref 6–20)
CO2: 28 mmol/L (ref 22–32)
Calcium: 9.2 mg/dL (ref 8.9–10.3)
Chloride: 98 mmol/L (ref 98–111)
Creatinine, Ser: 0.64 mg/dL (ref 0.44–1.00)
GFR calc Af Amer: >60 mL/min (ref >60)
GFR calc non Af Amer: >60 mL/min (ref >60)
Glucose, Bld: 105 mg/dL — ABNORMAL HIGH (ref 70–99)
Potassium: 4 mmol/L (ref 3.5–5.1)
Sodium: 136 mmol/L (ref 135–145)
Total Bilirubin: 0.6 mg/dL (ref 0.3–1.2)
Total Protein: 8.3 g/dL — ABNORMAL HIGH (ref 6.5–8.1)

## 2019-12-28 LAB — I-STAT BETA HCG BLOOD, ED (MC, WL, AP ONLY): I-stat hCG, quantitative: <5 m[IU]/mL (ref <5)

## 2019-12-28 MED ORDER — LIDOCAINE-EPINEPHRINE 2 %-1:100000 IJ SOLN
20.0000 mL | Freq: Once | INTRAMUSCULAR | Status: AC
  Administered 2019-12-28: 20 mL
  Filled 2019-12-28: qty 1

## 2019-12-28 MED ORDER — DOXYCYCLINE HYCLATE 100 MG PO CAPS: 100.0000 mg | ORAL_CAPSULE | Freq: Two times a day (BID) | ORAL | 0 refills | Status: DC

## 2019-12-28 MED ORDER — DOXYCYCLINE HYCLATE 100 MG PO TABS
100.0000 mg | ORAL_TABLET | Freq: Once | ORAL | Status: AC
  Administered 2019-12-28: 100 mg via ORAL
  Filled 2019-12-28: qty 1

## 2019-12-28 NOTE — ED Notes (Signed)
Called pt x2 from lobby Pt in bathroom

## 2019-12-28 NOTE — ED Triage Notes (Signed)
Patient is a drug user. Patient has a huge abscess on the left arm. Patient has not had a period and does not know if she is pregnant. Patient has small abscess on the right arm.

## 2019-12-28 NOTE — ED Provider Notes (Signed)
[facility]-EMERGENCY DEPT Provider Note   CSN: [id number] Arrival date & time: 12/28/19  1957     History Chief Complaint  Patient presents with  . Abscess    Cassandra Roberts is a 29 y.o. female.  HPI 29 year old female presents with left forearm abscess and redness.  Started a couple days ago.  She was able to drain a little bit of pus.  Since doing that, she has noticed increasing redness in her arm.  It hurts but she denies any other type of symptoms such as weakness or numbness.  She is an IV drug abuser. She has some scabbed lesions on her right forearm that are not swollen or purulent   Past Medical History:  Diagnosis Date  . Attention deficit disorder (ADD)   . Hepatitis C     Patient Active Problem List   Diagnosis Date Noted  . Substance abuse (HCC) 08/21/2013  . Depression 08/21/2013  . Suicidal ideation 08/21/2013  . Mood disorder (HCC) 08/21/2013  . ADD (attention deficit disorder) 12/15/2012    Past Surgical History:  Procedure Laterality Date  . APPENDECTOMY       OB History   No obstetric history on file.     History reviewed. No pertinent family history.  Social History   Tobacco Use  . Smoking status: Current Every Day Smoker    Packs/day: 0.25    Years: 2.00    Pack years: 0.50    Types: Cigarettes  . Smokeless tobacco: Never Used  Substance Use Topics  . Alcohol use: Yes    Comment: daily  . Drug use: Yes    Frequency: 1.0 times per week    Types: Marijuana, Cocaine    Home Medications Prior to Admission medications   Medication Sig Start Date End Date Taking? Authorizing Provider  atomoxetine (STRATTERA) 100 MG capsule Take 1 capsule (100 mg total) by mouth daily. Patient not taking: Reported on 06/15/2015 09/04/14   Tonye Pearson, MD  atomoxetine (STRATTERA) 40 MG capsule Take 1 capsule (40 mg total) by mouth daily. After 3 days use 2 tabs a day at HS. After 2 weeks change to 100mg  tabs Patient not  taking: Reported on 06/15/2015 09/04/14   09/06/14, MD  benzonatate (TESSALON) 100 MG capsule Take 1 capsule (100 mg total) by mouth every 8 (eight) hours. 07/02/15   07/04/15, MD  Biotin 1000 MCG tablet Take 1,000 mcg by mouth daily.    [provider]  buPROPion (WELLBUTRIN XL) 150 MG 24 hr tablet TAKE 1 TABLET BY MOUTH DAILY FOR 1 WEEK, THEN INCREASE TO 2 TABLETS EACH MORNING Patient not taking: Reported on 06/15/2015 03/18/15   05/18/15, MD  Cranberry 250 MG TABS Take 2 tablets by mouth daily.    [provider]  doxycycline (VIBRAMYCIN) 100 MG capsule Take 1 capsule (100 mg total) by mouth 2 (two) times daily. One po bid x 7 days 12/28/19   12/30/19, MD  ibuprofen (ADVIL,MOTRIN) 800 MG tablet Take 1 tablet (800 mg total) by mouth 3 (three) times daily. Patient not taking: Reported on 06/15/2015 10/22/13   10/24/13, PA-C  metroNIDAZOLE (FLAGYL) 500 MG tablet Take 1 tablet (500 mg total) by mouth 2 (two) times daily. One po bid x 7 days 07/02/15   07/04/15, MD  naloxone [facility]) 1 MG/ML injection Inject 0.4 mLs (0.4 mg total) into the vein as needed. 06/15/15   Palumbo, April, MD  Prenatal Vit-Fe Fumarate-FA (  PRENATAL MULTIVITAMIN) TABS tablet Take 1 tablet by mouth daily at 12 noon.    [provider]  vitamin B-12 (CYANOCOBALAMIN) 100 MCG tablet Take 100 mcg by mouth daily.    [provider]    Allergies    Dextromethorphan  Review of Systems   Review of Systems  Constitutional: Negative for fever.  Gastrointestinal: Negative for vomiting.  Skin: Positive for color change.  Neurological: Negative for weakness and numbness.  All other systems reviewed and are negative.   Physical Exam Updated Vital Signs BP 123/80 (BP Location: Right Arm)   Pulse (!) 103   Temp 98.1 F (36.7 C) (Oral)   Resp (!) 23   Ht 5\' 2"  (1.575 m)   Wt 54.4 kg   SpO2 98%   BMI 21.94 kg/m   Physical Exam Vitals and nursing  note reviewed.  Constitutional:      Appearance: She is well-developed.  HENT:     Head: Normocephalic and atraumatic.     Right Ear: External ear normal.     Left Ear: External ear normal.     Nose: Nose normal.  Eyes:     General:        Right eye: No discharge.        Left eye: No discharge.  Cardiovascular:     Rate and Rhythm: Regular rhythm. Tachycardia present.     Pulses:          Radial pulses are 2+ on the left side.  Pulmonary:     Effort: Pulmonary effort is normal.     Breath sounds: Normal breath sounds.  Abdominal:     General: There is no distension.  Musculoskeletal:     Left forearm: Swelling and tenderness present.     Left wrist: No swelling or tenderness. Normal range of motion.       Arms:     Comments: Normal sensation/strength in left hand. Normal radial, ulnar, median nerve testing in left hand.   Skin:    General: Skin is warm and dry.  Neurological:     Mental Status: She is alert.  Psychiatric:        Mood and Affect: Mood is not anxious.     ED Results / Procedures / Treatments   Labs (all labs ordered are listed, but only abnormal results are displayed) Labs Reviewed  COMPREHENSIVE METABOLIC PANEL - Abnormal; Notable for the following components:      Result Value   Glucose, Bld 105 (*)    Total Protein 8.3 (*)    ALT 78 (*)    All other components within normal limits  CBC WITH DIFFERENTIAL/PLATELET - Abnormal; Notable for the following components:   WBC 10.7 (*)    All other components within normal limits  URINALYSIS, ROUTINE W REFLEX MICROSCOPIC  RPR  HIV ANTIBODY (ROUTINE TESTING W REFLEX)  I-STAT BETA HCG BLOOD, ED ([facility], [facility], [facility] ONLY)  GC/CHLAMYDIA PROBE AMP ([place]) NOT AT [facility]    EKG None  Radiology No results found.  Procedures .[facility] HOSPITALIncision and Drainage  Date/Time: 12/28/2019 10:55 PM Performed by: 12/30/2019, MD Authorized by: Pricilla Loveless, MD   Consent:    Consent obtained:  Verbal   Consent given  by:  Patient Location:    Type:  Abscess   Size:  5 cm   Location:  Upper extremity   Upper extremity location:  Arm   Arm location:  L lower arm Pre-procedure details:    Skin preparation:  Betadine Anesthesia (see MAR for exact dosages):    Anesthesia method:  Local infiltration   Local anesthetic:  Lidocaine 2% WITH epi Procedure type:    Complexity:  Complex Procedure details:    Incision types:  Single straight   Scalpel blade:  11   Wound management:  Probed and deloculated   Drainage:  Purulent   Drainage amount:  Moderate   Packing materials:  1/4 in iodoform gauze Post-procedure details:    Patient tolerance of procedure:  Tolerated well, no immediate complications Ultrasound ED Soft Tissue  Date/Time: 12/28/2019 10:56 PM Performed by: Sherwood Gambler, MD Authorized by: Sherwood Gambler, MD   Procedure details:    Indications: localization of abscess     Transverse view:  Visualized   Longitudinal view:  Visualized   Images: archived   Location:    Location: upper extremity     Side:  Left Findings:     abscess present    cellulitis present   (including critical care time)  Medications Ordered in ED Medications  doxycycline (VIBRA-TABS) tablet 100 mg (has no administration in time range)  lidocaine-EPINEPHrine (XYLOCAINE W/EPI) 2 %-1:100000 (with pres) injection 20 mL (20 mLs Infiltration Given 12/28/19 2250)    ED Course  I have reviewed the triage vital signs and the nursing notes.  Pertinent labs & imaging results that were available during my care of the patient were reviewed by me and considered in my medical decision making (see chart for details).    MDM Rules/Calculators/A&P                      Bedside ultrasound shows this appears to be relatively superficial.  There is extensive cellulitis as well.  Systemically she is not ill though she does have mild tachycardia.  Labs overall reassuring besides mild WBC elevation.  ALT elevation is  chronic, likely from hep C.  Abscess was incised and drained as above.  I had offered to do other imaging of her arm given the extensive cellulitis such as CT but she declines.  My suspicion that this involves muscle tissue or deeper tissue is lower.  We discussed return precautions.  She is also asking for asymptomatic STD screening which has been ordered.  We will treat with doxycycline. Final Clinical Impression(s) / ED Diagnoses Final diagnoses:  Cellulitis and abscess of upper extremity    Rx / DC Orders ED Discharge Orders         Ordered    doxycycline (VIBRAMYCIN) 100 MG capsule  2 times daily     12/28/19 2251           Sherwood Gambler, MD 12/28/19 2257

## 2019-12-28 NOTE — Discharge Instructions (Addendum)
If you develop fever, new or worsening pain, vomiting, worsening redness/infection, or any other new/concerning symptoms then return to the ER for evaluation.  Be sure to take the antibiotics until completed, even if you are feeling better.

## 2019-12-28 NOTE — ED Notes (Signed)
Sent UA to lab. Due to sample size was not able to fill gray test tube

## 2019-12-28 NOTE — ED Notes (Signed)
Patient in restroom at this time, unable to begin triage.

## 2019-12-29 LAB — HIV ANTIBODY (ROUTINE TESTING W REFLEX): HIV Screen 4th Generation wRfx: NONREACTIVE

## 2019-12-29 LAB — RPR: RPR Ser Ql: NONREACTIVE

## 2020-03-23 ENCOUNTER — Emergency Department (HOSPITAL_COMMUNITY): Payer: Self-pay

## 2020-03-23 ENCOUNTER — Inpatient Hospital Stay (HOSPITAL_COMMUNITY): Payer: Self-pay | Admitting: Certified Registered Nurse Anesthetist

## 2020-03-23 ENCOUNTER — Encounter (HOSPITAL_COMMUNITY)
Admission: EM | Discharge status: Against Medical Advice | Payer: Self-pay | Source: Home / Self Care | Attending: Internal Medicine

## 2020-03-23 ENCOUNTER — Inpatient Hospital Stay (HOSPITAL_COMMUNITY)
Admission: EM | Admit: 2020-03-23 | Discharge: 2020-03-24 | DRG: 580 | Discharge status: Against Medical Advice | Payer: Self-pay | Attending: Internal Medicine | Admitting: Internal Medicine

## 2020-03-23 ENCOUNTER — Other Ambulatory Visit: Payer: Self-pay

## 2020-03-23 ENCOUNTER — Encounter (HOSPITAL_COMMUNITY): Payer: Self-pay

## 2020-03-23 DIAGNOSIS — F1210 Cannabis abuse, uncomplicated: Secondary | ICD-10-CM | POA: Diagnosis present

## 2020-03-23 DIAGNOSIS — Z720 Tobacco use: Secondary | ICD-10-CM

## 2020-03-23 DIAGNOSIS — Z5329 Procedure and treatment not carried out because of patient's decision for other reasons: Secondary | ICD-10-CM | POA: Diagnosis not present

## 2020-03-23 DIAGNOSIS — F1510 Other stimulant abuse, uncomplicated: Secondary | ICD-10-CM | POA: Diagnosis present

## 2020-03-23 DIAGNOSIS — B182 Chronic viral hepatitis C: Secondary | ICD-10-CM | POA: Diagnosis present

## 2020-03-23 DIAGNOSIS — F1410 Cocaine abuse, uncomplicated: Secondary | ICD-10-CM | POA: Diagnosis present

## 2020-03-23 DIAGNOSIS — F1910 Other psychoactive substance abuse, uncomplicated: Secondary | ICD-10-CM

## 2020-03-23 DIAGNOSIS — F1990 Other psychoactive substance use, unspecified, uncomplicated: Secondary | ICD-10-CM

## 2020-03-23 DIAGNOSIS — Z888 Allergy status to other drugs, medicaments and biological substances status: Secondary | ICD-10-CM

## 2020-03-23 DIAGNOSIS — Z8619 Personal history of other infectious and parasitic diseases: Secondary | ICD-10-CM

## 2020-03-23 DIAGNOSIS — F17210 Nicotine dependence, cigarettes, uncomplicated: Secondary | ICD-10-CM | POA: Diagnosis present

## 2020-03-23 DIAGNOSIS — L02414 Cutaneous abscess of left upper limb: Principal | ICD-10-CM | POA: Diagnosis present

## 2020-03-23 DIAGNOSIS — F1113 Opioid abuse with withdrawal: Secondary | ICD-10-CM | POA: Diagnosis present

## 2020-03-23 DIAGNOSIS — L03114 Cellulitis of left upper limb: Secondary | ICD-10-CM | POA: Diagnosis present

## 2020-03-23 DIAGNOSIS — F988 Other specified behavioral and emotional disorders with onset usually occurring in childhood and adolescence: Secondary | ICD-10-CM | POA: Diagnosis present

## 2020-03-23 DIAGNOSIS — Z20822 Contact with and (suspected) exposure to covid-19: Secondary | ICD-10-CM | POA: Diagnosis present

## 2020-03-23 HISTORY — PX: MINOR IRRIGATION AND DEBRIDEMENT OF WOUND: SHX6239

## 2020-03-23 LAB — COMPREHENSIVE METABOLIC PANEL
ALT: 43 U/L (ref 0–44)
AST: 21 U/L (ref 15–41)
Albumin: 3.5 g/dL (ref 3.5–5.0)
Alkaline Phosphatase: 137 U/L — ABNORMAL HIGH (ref 38–126)
Anion gap: 9 (ref 5–15)
BUN: 12 mg/dL (ref 6–20)
CO2: 28 mmol/L (ref 22–32)
Calcium: 8.9 mg/dL (ref 8.9–10.3)
Chloride: 101 mmol/L (ref 98–111)
Creatinine, Ser: 0.67 mg/dL (ref 0.44–1.00)
GFR calc Af Amer: >60 mL/min (ref >60)
GFR calc non Af Amer: >60 mL/min (ref >60)
Glucose, Bld: 95 mg/dL (ref 70–99)
Potassium: 3.5 mmol/L (ref 3.5–5.1)
Sodium: 138 mmol/L (ref 135–145)
Total Bilirubin: 0.5 mg/dL (ref 0.3–1.2)
Total Protein: 7.6 g/dL (ref 6.5–8.1)

## 2020-03-23 LAB — CBC WITH DIFFERENTIAL/PLATELET
Abs Immature Granulocytes: 0.05 10*3/uL (ref 0.00–0.07)
Basophils Absolute: 0 10*3/uL (ref 0.0–0.1)
Basophils Relative: 0 %
Eosinophils Absolute: 0.2 10*3/uL (ref 0.0–0.5)
Eosinophils Relative: 2 %
HCT: 38.8 % (ref 36.0–46.0)
Hemoglobin: 12.9 g/dL (ref 12.0–15.0)
Immature Granulocytes: 0 %
Lymphocytes Relative: 14 %
Lymphs Abs: 1.6 10*3/uL (ref 0.7–4.0)
MCH: 29.1 pg (ref 26.0–34.0)
MCHC: 33.2 g/dL (ref 30.0–36.0)
MCV: 87.4 fL (ref 80.0–100.0)
Monocytes Absolute: 0.9 10*3/uL (ref 0.1–1.0)
Monocytes Relative: 8 %
Neutro Abs: 8.9 10*3/uL — ABNORMAL HIGH (ref 1.7–7.7)
Neutrophils Relative %: 76 %
Platelets: 281 10*3/uL (ref 150–400)
RBC: 4.44 MIL/uL (ref 3.87–5.11)
RDW: 13.4 % (ref 11.5–15.5)
WBC: 11.7 10*3/uL — ABNORMAL HIGH (ref 4.0–10.5)
nRBC: 0 % (ref 0.0–0.2)

## 2020-03-23 LAB — TSH: TSH: 1.016 u[IU]/mL (ref 0.350–4.500)

## 2020-03-23 LAB — URINALYSIS, ROUTINE W REFLEX MICROSCOPIC
Bilirubin Urine: NEGATIVE
Glucose, UA: NEGATIVE mg/dL
Hgb urine dipstick: NEGATIVE
Ketones, ur: NEGATIVE mg/dL
Leukocytes,Ua: NEGATIVE
Nitrite: NEGATIVE
Protein, ur: NEGATIVE mg/dL
Specific Gravity, Urine: 1.02 (ref 1.005–1.030)
pH: 7 (ref 5.0–8.0)

## 2020-03-23 LAB — I-STAT BETA HCG BLOOD, ED (MC, WL, AP ONLY): I-stat hCG, quantitative: <5 m[IU]/mL (ref <5)

## 2020-03-23 LAB — LACTIC ACID, PLASMA: Lactic Acid, Venous: 1.6 mmol/L (ref 0.5–1.9)

## 2020-03-23 LAB — SARS CORONAVIRUS 2 BY RT PCR (HOSPITAL ORDER, PERFORMED IN ~~LOC~~ HOSPITAL LAB): SARS Coronavirus 2: NEGATIVE

## 2020-03-23 SURGERY — MINOR IRRIGATION AND DEBRIDEMENT OF WOUND
Anesthesia: General | Site: Arm Upper | Laterality: Left

## 2020-03-23 MED ORDER — DEXMEDETOMIDINE HCL IN NACL 200 MCG/50ML IV SOLN
INTRAVENOUS | Status: AC
  Filled 2020-03-23: qty 50

## 2020-03-23 MED ORDER — ACETAMINOPHEN 325 MG PO TABS: 650.0000 mg | ORAL_TABLET | Freq: Four times a day (QID) | ORAL | Status: DC | PRN

## 2020-03-23 MED ORDER — PROMETHAZINE HCL 25 MG/ML IJ SOLN: 6.2500 mg | INTRAMUSCULAR | Status: DC | PRN

## 2020-03-23 MED ORDER — DEXMEDETOMIDINE HCL IN NACL 200 MCG/50ML IV SOLN
INTRAVENOUS | Status: DC | PRN
Start: 2020-03-23 — End: 2020-03-23
  Administered 2020-03-23 (×3): 10 ug via INTRAVENOUS

## 2020-03-23 MED ORDER — PROPOFOL 10 MG/ML IV BOLUS
INTRAVENOUS | Status: AC
  Filled 2020-03-23: qty 20

## 2020-03-23 MED ORDER — ONDANSETRON HCL 4 MG/2ML IJ SOLN: 4.0000 mg | Freq: Four times a day (QID) | INTRAMUSCULAR | Status: DC | PRN

## 2020-03-23 MED ORDER — OXYCODONE HCL 5 MG PO TABS
ORAL_TABLET | ORAL | Status: AC
  Filled 2020-03-23: qty 1

## 2020-03-23 MED ORDER — DEXAMETHASONE SODIUM PHOSPHATE 10 MG/ML IJ SOLN
INTRAMUSCULAR | Status: AC
  Filled 2020-03-23: qty 1

## 2020-03-23 MED ORDER — METHOCARBAMOL 500 MG PO TABS
500.0000 mg | ORAL_TABLET | Freq: Four times a day (QID) | ORAL | Status: DC | PRN
  Administered 2020-03-23: 500 mg via ORAL
  Filled 2020-03-23: qty 1

## 2020-03-23 MED ORDER — ESMOLOL HCL 100 MG/10ML IV SOLN
INTRAVENOUS | Status: AC
  Filled 2020-03-23: qty 10

## 2020-03-23 MED ORDER — SODIUM CHLORIDE 0.9 % IR SOLN
Status: DC | PRN
  Administered 2020-03-23: 1000 mL

## 2020-03-23 MED ORDER — ROCURONIUM BROMIDE 10 MG/ML (PF) SYRINGE
PREFILLED_SYRINGE | INTRAVENOUS | Status: AC
  Filled 2020-03-23: qty 10

## 2020-03-23 MED ORDER — HYDROXYZINE HCL 25 MG PO TABS
25.0000 mg | ORAL_TABLET | Freq: Four times a day (QID) | ORAL | Status: DC | PRN
  Administered 2020-03-23 – 2020-03-24 (×3): 25 mg via ORAL
  Filled 2020-03-23 (×4): qty 1

## 2020-03-23 MED ORDER — ONDANSETRON HCL 4 MG/2ML IJ SOLN
INTRAMUSCULAR | Status: DC | PRN
  Administered 2020-03-23: 4 mg via INTRAVENOUS

## 2020-03-23 MED ORDER — CLONIDINE HCL 0.1 MG PO TABS
0.1000 mg | ORAL_TABLET | Freq: Four times a day (QID) | ORAL | Status: DC
  Administered 2020-03-23 – 2020-03-24 (×4): 0.1 mg via ORAL
  Filled 2020-03-23 (×4): qty 1

## 2020-03-23 MED ORDER — DEXAMETHASONE SODIUM PHOSPHATE 10 MG/ML IJ SOLN
INTRAMUSCULAR | Status: DC | PRN
  Administered 2020-03-23: 10 mg via INTRAVENOUS

## 2020-03-23 MED ORDER — FENTANYL CITRATE (PF) 250 MCG/5ML IJ SOLN
INTRAMUSCULAR | Status: DC | PRN
  Administered 2020-03-23: 100 ug via INTRAVENOUS
  Administered 2020-03-23: 50 ug via INTRAVENOUS
  Administered 2020-03-23: 100 ug via INTRAVENOUS

## 2020-03-23 MED ORDER — IOHEXOL 300 MG/ML  SOLN
100.0000 mL | Freq: Once | INTRAMUSCULAR | Status: AC | PRN
  Administered 2020-03-23: 100 mL via INTRAVENOUS

## 2020-03-23 MED ORDER — NICOTINE 14 MG/24HR TD PT24
14.0000 mg | MEDICATED_PATCH | Freq: Once | TRANSDERMAL | Status: AC
  Administered 2020-03-23: 14 mg via TRANSDERMAL
  Filled 2020-03-23: qty 1

## 2020-03-23 MED ORDER — ACETAMINOPHEN 650 MG RE SUPP: 650.0000 mg | Freq: Four times a day (QID) | RECTAL | Status: DC | PRN

## 2020-03-23 MED ORDER — VANCOMYCIN HCL 500 MG IV SOLR: 500.0000 mg | Freq: Three times a day (TID) | INTRAVENOUS | Status: DC

## 2020-03-23 MED ORDER — SODIUM CHLORIDE 0.9 % IV SOLN
2.0000 g | INTRAVENOUS | Status: AC
  Administered 2020-03-23: 2 g via INTRAVENOUS
  Filled 2020-03-23: qty 2

## 2020-03-23 MED ORDER — NICOTINE 14 MG/24HR TD PT24
14.0000 mg | MEDICATED_PATCH | Freq: Every day | TRANSDERMAL | Status: DC
  Administered 2020-03-24: 14 mg via TRANSDERMAL
  Filled 2020-03-23: qty 1

## 2020-03-23 MED ORDER — SUGAMMADEX SODIUM 200 MG/2ML IV SOLN
INTRAVENOUS | Status: DC | PRN
  Administered 2020-03-23: 200 mg via INTRAVENOUS

## 2020-03-23 MED ORDER — HYDROMORPHONE HCL 1 MG/ML IJ SOLN
1.0000 mg | INTRAMUSCULAR | Status: DC | PRN
  Administered 2020-03-23 – 2020-03-24 (×8): 1 mg via INTRAVENOUS
  Filled 2020-03-23 (×8): qty 1

## 2020-03-23 MED ORDER — FENTANYL CITRATE (PF) 100 MCG/2ML IJ SOLN
INTRAMUSCULAR | Status: AC
  Filled 2020-03-23: qty 2

## 2020-03-23 MED ORDER — KETOROLAC TROMETHAMINE 30 MG/ML IJ SOLN
30.0000 mg | Freq: Four times a day (QID) | INTRAMUSCULAR | Status: DC | PRN
  Administered 2020-03-23 – 2020-03-24 (×2): 30 mg via INTRAVENOUS
  Filled 2020-03-23 (×2): qty 1

## 2020-03-23 MED ORDER — ACETAMINOPHEN 10 MG/ML IV SOLN: 1000.0000 mg | Freq: Once | INTRAVENOUS | Status: DC | PRN

## 2020-03-23 MED ORDER — MORPHINE SULFATE (PF) 4 MG/ML IV SOLN
4.0000 mg | Freq: Once | INTRAVENOUS | Status: AC
  Administered 2020-03-23: 4 mg via INTRAVENOUS
  Filled 2020-03-23: qty 1

## 2020-03-23 MED ORDER — PROPOFOL 10 MG/ML IV BOLUS
INTRAVENOUS | Status: DC | PRN
  Administered 2020-03-23: 200 mg via INTRAVENOUS

## 2020-03-23 MED ORDER — SODIUM CHLORIDE 0.9 % IV SOLN
2.0000 g | Freq: Three times a day (TID) | INTRAVENOUS | Status: DC
  Administered 2020-03-23 – 2020-03-24 (×3): 2 g via INTRAVENOUS
  Filled 2020-03-23 (×5): qty 2

## 2020-03-23 MED ORDER — ONDANSETRON HCL 4 MG PO TABS: 4.0000 mg | ORAL_TABLET | Freq: Four times a day (QID) | ORAL | Status: DC | PRN

## 2020-03-23 MED ORDER — LIDOCAINE 2% (20 MG/ML) 5 ML SYRINGE
INTRAMUSCULAR | Status: AC
  Filled 2020-03-23: qty 5

## 2020-03-23 MED ORDER — MIDAZOLAM HCL 5 MG/5ML IJ SOLN
INTRAMUSCULAR | Status: DC | PRN
  Administered 2020-03-23: 2 mg via INTRAVENOUS

## 2020-03-23 MED ORDER — MIDAZOLAM HCL 2 MG/2ML IJ SOLN
INTRAMUSCULAR | Status: AC
  Filled 2020-03-23: qty 2

## 2020-03-23 MED ORDER — OXYCODONE HCL 5 MG PO TABS
5.0000 mg | ORAL_TABLET | Freq: Once | ORAL | Status: AC | PRN
  Administered 2020-03-23: 5 mg via ORAL

## 2020-03-23 MED ORDER — VANCOMYCIN HCL 500 MG/100ML IV SOLN
500.0000 mg | Freq: Three times a day (TID) | INTRAVENOUS | Status: DC
  Administered 2020-03-23 – 2020-03-24 (×2): 500 mg via INTRAVENOUS
  Filled 2020-03-23 (×3): qty 100

## 2020-03-23 MED ORDER — DICYCLOMINE HCL 20 MG PO TABS: 20.0000 mg | ORAL_TABLET | Freq: Four times a day (QID) | ORAL | Status: DC | PRN

## 2020-03-23 MED ORDER — SODIUM CHLORIDE 0.9% FLUSH
3.0000 mL | Freq: Once | INTRAVENOUS | Status: AC
  Administered 2020-03-23: 3 mL via INTRAVENOUS

## 2020-03-23 MED ORDER — ACETAMINOPHEN 160 MG/5ML PO SOLN: 1000.0000 mg | Freq: Once | ORAL | Status: DC | PRN

## 2020-03-23 MED ORDER — FENTANYL CITRATE (PF) 250 MCG/5ML IJ SOLN
INTRAMUSCULAR | Status: AC
  Filled 2020-03-23: qty 5

## 2020-03-23 MED ORDER — LIDOCAINE 2% (20 MG/ML) 5 ML SYRINGE
INTRAMUSCULAR | Status: DC | PRN
  Administered 2020-03-23: 100 mg via INTRAVENOUS

## 2020-03-23 MED ORDER — LOPERAMIDE HCL 2 MG PO CAPS: 2.0000 mg | ORAL_CAPSULE | ORAL | Status: DC | PRN

## 2020-03-23 MED ORDER — VANCOMYCIN HCL 500 MG IV SOLR
500.0000 mg | Freq: Three times a day (TID) | INTRAVENOUS | Status: DC
  Filled 2020-03-23: qty 500

## 2020-03-23 MED ORDER — ACETAMINOPHEN 500 MG PO TABS: 1000.0000 mg | ORAL_TABLET | Freq: Once | ORAL | Status: DC | PRN

## 2020-03-23 MED ORDER — VANCOMYCIN HCL IN DEXTROSE 1-5 GM/200ML-% IV SOLN
1000.0000 mg | Freq: Once | INTRAVENOUS | Status: AC
  Administered 2020-03-23: 1000 mg via INTRAVENOUS
  Filled 2020-03-23: qty 200

## 2020-03-23 MED ORDER — SODIUM CHLORIDE 0.9 % IV SOLN: INTRAVENOUS | Status: AC

## 2020-03-23 MED ORDER — OXYCODONE HCL 5 MG PO TABS
5.0000 mg | ORAL_TABLET | ORAL | Status: DC | PRN
  Administered 2020-03-23 – 2020-03-24 (×3): 10 mg via ORAL
  Administered 2020-03-24: 5 mg via ORAL
  Administered 2020-03-24 (×2): 10 mg via ORAL
  Administered 2020-03-24: 5 mg via ORAL
  Filled 2020-03-23: qty 2
  Filled 2020-03-23: qty 1
  Filled 2020-03-23: qty 2
  Filled 2020-03-23: qty 1
  Filled 2020-03-23 (×3): qty 2

## 2020-03-23 MED ORDER — CLONIDINE HCL 0.1 MG PO TABS: 0.1000 mg | ORAL_TABLET | ORAL | Status: DC

## 2020-03-23 MED ORDER — LACTATED RINGERS IV SOLN: INTRAVENOUS | Status: DC | PRN

## 2020-03-23 MED ORDER — ONDANSETRON HCL 4 MG/2ML IJ SOLN
INTRAMUSCULAR | Status: AC
  Filled 2020-03-23: qty 2

## 2020-03-23 MED ORDER — CLONIDINE HCL 0.1 MG PO TABS: 0.1000 mg | ORAL_TABLET | Freq: Every day | ORAL | Status: DC

## 2020-03-23 MED ORDER — ROCURONIUM BROMIDE 10 MG/ML (PF) SYRINGE
PREFILLED_SYRINGE | INTRAVENOUS | Status: DC | PRN
  Administered 2020-03-23: 40 mg via INTRAVENOUS

## 2020-03-23 MED ORDER — METRONIDAZOLE IN NACL 5-0.79 MG/ML-% IV SOLN
500.0000 mg | Freq: Three times a day (TID) | INTRAVENOUS | Status: DC
  Administered 2020-03-23 – 2020-03-24 (×3): 500 mg via INTRAVENOUS
  Filled 2020-03-23 (×3): qty 100

## 2020-03-23 MED ORDER — FENTANYL CITRATE (PF) 100 MCG/2ML IJ SOLN
25.0000 ug | INTRAMUSCULAR | Status: DC | PRN
  Administered 2020-03-23 (×4): 50 ug via INTRAVENOUS

## 2020-03-23 MED ORDER — SODIUM CHLORIDE (PF) 0.9 % IJ SOLN
INTRAMUSCULAR | Status: AC
  Filled 2020-03-23: qty 50

## 2020-03-23 MED ORDER — OXYCODONE HCL 5 MG/5ML PO SOLN: 5.0000 mg | Freq: Once | ORAL | Status: AC | PRN

## 2020-03-23 SURGICAL SUPPLY — 36 items
BAG SPEC THK2 15X12 ZIP CLS (MISCELLANEOUS) ×1
BAG ZIPLOCK 12X15 (MISCELLANEOUS) ×3 IMPLANT
BANDAGE ESMARK 6X9 LF (GAUZE/BANDAGES/DRESSINGS) ×1 IMPLANT
BNDG CMPR 9X6 STRL LF SNTH (GAUZE/BANDAGES/DRESSINGS) ×1
BNDG ELASTIC 4X5.8 VLCR STR LF (GAUZE/BANDAGES/DRESSINGS) ×2 IMPLANT
BNDG ESMARK 6X9 LF (GAUZE/BANDAGES/DRESSINGS) ×3
BNDG GAUZE ELAST 4 BULKY (GAUZE/BANDAGES/DRESSINGS) ×3 IMPLANT
COVER SURGICAL LIGHT HANDLE (MISCELLANEOUS) ×3 IMPLANT
COVER WAND RF STERILE (DRAPES) IMPLANT
CUFF TOURN SGL QUICK 18X4 (TOURNIQUET CUFF) IMPLANT
CUFF TOURN SGL QUICK 24 (TOURNIQUET CUFF)
CUFF TOURN SGL QUICK 34 (TOURNIQUET CUFF)
CUFF TRNQT CYL 24X4X16.5-23 (TOURNIQUET CUFF) IMPLANT
CUFF TRNQT CYL 34X4.125X (TOURNIQUET CUFF) IMPLANT
DRAIN PENROSE 0.5X18 (DRAIN) ×3 IMPLANT
DRSG PAD ABDOMINAL 8X10 ST (GAUZE/BANDAGES/DRESSINGS) ×4 IMPLANT
DURAPREP 26ML APPLICATOR (WOUND CARE) ×3 IMPLANT
ELECT REM PT RETURN 15FT ADLT (MISCELLANEOUS) ×3 IMPLANT
GAUZE SPONGE 4X4 12PLY STRL (GAUZE/BANDAGES/DRESSINGS) ×5 IMPLANT
GLOVE BIO SURGEON STRL SZ7.5 (GLOVE) ×3 IMPLANT
GLOVE BIOGEL PI IND STRL 8 (GLOVE) ×1 IMPLANT
GLOVE BIOGEL PI INDICATOR 8 (GLOVE) ×2
GLOVE ECLIPSE 8.0 STRL XLNG CF (GLOVE) ×3 IMPLANT
GOWN STRL REUS W/TWL XL LVL3 (GOWN DISPOSABLE) ×3 IMPLANT
HANDPIECE INTERPULSE COAX TIP (DISPOSABLE) ×3
KIT BASIN (CUSTOM PROCEDURE TRAY) ×3 IMPLANT
KIT TURNOVER KIT A (KITS) IMPLANT
PACK ORTHO EXTREMITY (CUSTOM PROCEDURE TRAY) ×3 IMPLANT
PAD CAST 4YDX4 CTTN HI CHSV (CAST SUPPLIES) ×1 IMPLANT
PADDING CAST COTTON 4X4 STRL (CAST SUPPLIES) ×3
PENCIL SMOKE EVACUATOR (MISCELLANEOUS) IMPLANT
PROTECTOR NERVE ULNAR (MISCELLANEOUS) ×3 IMPLANT
SET HNDPC FAN SPRY TIP SCT (DISPOSABLE) ×1 IMPLANT
SUT ETHILON 2 0 PS N (SUTURE) ×6 IMPLANT
SYR CONTROL 10ML LL (SYRINGE) ×3 IMPLANT
TOWEL OR 17X26 10 PK STRL BLUE (TOWEL DISPOSABLE) ×6 IMPLANT

## 2020-03-23 NOTE — Progress Notes (Signed)
Pharmacy Antibiotic Note  Cassandra Roberts is a 29 y.o. female admitted on 03/23/2020 with Arm abscess.  Pharmacy has been consulted for cefepime + vancomycin dosing.  Pt is 58 yoF with history of ongoing IVDU. Presented to ED with left arm abscess/pain. Ortho has been consulted, planning for I&D, initiation of broad spectrum antibiotics.   Today, 03/23/20 -WBC 11.7 -SCr 0.7, CrCl ~80 mL/min -Lactate 1.6 -Afebrile -TBW 56.7 kg  Plan:  Cefepime 2 g IV q8h  Vancomycin 1000 mg LD followed by 500 mg IV q8h for goal VT 15-20  Metronidazole 500 mg IV q8h  Follow renal function, culture data, VT as needed once at steady state  Height: 5\' 2"  (157.5 cm) Weight: 56.7 kg (125 lb) IBW/kg (Calculated) : 50.1  Temp (24hrs), Avg:98.9 F (37.2 C), Min:98.9 F (37.2 C), Max:98.9 F (37.2 C)  Recent Labs  Lab 03/23/20 0349 03/23/20 0352  WBC  --  11.7*  CREATININE  --  0.67  LATICACIDVEN 1.6  --     Estimated Creatinine Clearance: 82.8 mL/min (by C-G formula based on SCr of 0.67 mg/dL).    Allergies  Allergen Reactions  . Dextromethorphan     Shaking   Antimicrobials this admission: cefepime 5/16 >>  vancomycin 5/16 >>  Metronidazole 5/16 >>  Dose adjustments this admission:  Microbiology results: 5/16 BCx: Sent  Thank you for allowing pharmacy to be a part of this patient's care.  6/16, PharmD 03/23/2020 1:42 PM

## 2020-03-23 NOTE — ED Triage Notes (Signed)
Pt reports left upper arm pain for 5 days. Area is red and significantly swollen. Reports decreased ROM in that extremity. She has been taking cephalexin 500mg  4x a day for 4 days. Reports a hx of injecting meth and heroin. She states that she last used heroin earlier today.

## 2020-03-23 NOTE — H&P (Signed)
History and Physical    Cassandra Roberts PJK:[id number] DOB: 04/04/1991 DOA: 03/23/2020  PCP: Patient, No Pcp Per Patient coming from: home  Chief Complaint: Left arm redness and pain  HPI: Cassandra Roberts is a 29 y.o. female with medical history significant of IV drug abuse, polysubstance abuse including methamphetamine and heroin, tobacco abuse 1/2 pack daily, hepatitis C, who presented with left arm redness and pain.  Patient admits IVDU with either methamphetamine or heroin.  Her last use was IV heroin IVDU yesterday on 03/22/2020.  Patient reports that she has had progressively worsening of left arm redness and pain over last 3-4 days.  The redness started from her mid arm just below antecubital area, and progressively worsened upwards to her mid upper arm area.  Associated symptoms included left arm swelling and warm to touch.  Patient's friend gave her some cephalexin 500 mg tablets, and she has been taking it 4 times a day for last 3 days. Denies fevers, chills, sick contact, chest pain/pressure, shortness of breath, cough, wheezing, nausea, vomiting, diarrhea, abdominal pain, dysuria, urinary frequency or urgency.    Patient came to [facility] long ED for further evaluation today.  In the emergency room, she was afebrile with a pulse 92, RR 16, BP 124/91 and room air O2 sats 99%. The labs showed nonrevealing CMP, WBC 11.7, normal lactic acid, negative UA.  Left arm CT suggested two abscesses to left arm.   Review of Systems: As per HPI otherwise 10 point review of systems negative.  Review of Systems Otherwise negative except as per HPI, including: General: Denies fever, chills, night sweats or unintended weight loss. Resp: Denies cough, wheezing, shortness of breath. Cardiac: Denies chest pain, palpitations, orthopnea, paroxysmal nocturnal dyspnea. GI: Denies abdominal pain, nausea, vomiting, diarrhea or constipation GU: Denies dysuria, frequency, hesitancy or incontinence MS: Left arm  redness, pain, swelling and warmth to touch Neuro: Denies headache, neurologic deficits (focal weakness, numbness, tingling), abnormal gait Psych: Denies anxiety, depression, SI/HI/AVH Skin: Denies new rashes or lesions ID: Denies sick contacts, exotic exposures, travel  Past Medical History:  Diagnosis Date  . Attention deficit disorder (ADD)   . Hepatitis C     Past Surgical History:  Procedure Laterality Date  . APPENDECTOMY      SOCIAL HISTORY:  reports that she has been smoking cigarettes. She has a 0.50 pack-year smoking history. She has never used smokeless tobacco. She reports current alcohol use. She reports current drug use. Frequency: 1.00 time per week. Drugs: Marijuana and Cocaine.  Allergies  Allergen Reactions  . Dextromethorphan     Shaking    FAMILY HISTORY: History reviewed. No pertinent family history.   Prior to Admission medications   Medication Sig Start Date End Date Taking? Authorizing Provider  ibuprofen (ADVIL) 200 MG tablet Take 200 mg by mouth every 6 (six) hours as needed for moderate pain.   Yes [provider]  atomoxetine (STRATTERA) 100 MG capsule Take 1 capsule (100 mg total) by mouth daily. Patient not taking: Reported on 06/15/2015 09/04/14   Leandrew Koyanagi, MD  atomoxetine (STRATTERA) 40 MG capsule Take 1 capsule (40 mg total) by mouth daily. After 3 days use 2 tabs a day at HS. After 2 weeks change to 100mg  tabs Patient not taking: Reported on 06/15/2015 09/04/14   Leandrew Koyanagi, MD  benzonatate (TESSALON) 100 MG capsule Take 1 capsule (100 mg total) by mouth every 8 (eight) hours. Patient not taking: Reported on 03/23/2020 07/02/15   Debby Freiberg,  MD  buPROPion (WELLBUTRIN XL) 150 MG 24 hr tablet TAKE 1 TABLET BY MOUTH DAILY FOR 1 WEEK, THEN INCREASE TO 2 TABLETS EACH MORNING Patient not taking: Reported on 06/15/2015 03/18/15   Tonye Pearson, MD  doxycycline (VIBRAMYCIN) 100 MG capsule Take 1 capsule (100 mg total)  by mouth 2 (two) times daily. One po bid x 7 days Patient not taking: Reported on 03/23/2020 12/28/19   Pricilla Loveless, MD  ibuprofen (ADVIL,MOTRIN) 800 MG tablet Take 1 tablet (800 mg total) by mouth 3 (three) times daily. Patient not taking: Reported on 06/15/2015 10/22/13   Roxy Horseman, PA-C  metroNIDAZOLE (FLAGYL) 500 MG tablet Take 1 tablet (500 mg total) by mouth 2 (two) times daily. One po bid x 7 days Patient not taking: Reported on 03/23/2020 07/02/15   Mirian Mo, MD  naloxone [facility]) 1 MG/ML injection Inject 0.4 mLs (0.4 mg total) into the vein as needed. Patient not taking: Reported on 03/23/2020 06/15/15   Cy Blamer, MD    Physical Exam: Vitals:   03/23/20 0149 03/23/20 0346 03/23/20 0750 03/23/20 0952  BP: (!) 124/91 (!) 134/97 (!) 132/93 125/86  Pulse: 92 98 88 98  Resp: 16 18 18 18   Temp:  98.9 F (37.2 C)    TempSrc:  Oral    SpO2: 99% 100% 100% 99%      Constitutional: NAD,  Acute ill-appearing.  Anxious. Eyes: PERRL, lids and conjunctivae normal ENMT: Mucous membranes are moist. Posterior pharynx clear of any exudate or lesions.Normal dentition.  Neck: normal, supple, no masses, no thyromegaly Respiratory: clear to auscultation bilaterally, no wheezing, no crackles. Normal respiratory effort. No accessory muscle use.  Cardiovascular: Regular rate and rhythm, no murmurs / rubs / gallops. No extremity edema. 2+ pedal pulses. No carotid bruits.  Abdomen: no tenderness, no masses palpated. No hepatosplenomegaly. Bowel sounds positive.  Musculoskeletal: Erythema, TTP, swelling and warm to touch to her mid arm just below antecubital area and left mid upper arm area.  Skin: Multiple track marks to bilateral hands and arms Neurologic: CN 2-12 grossly intact. Sensation intact, DTR normal. Strength 5/5 in all 4.  Psychiatric: Normal judgment and insight. Alert and oriented x 3. Normal mood.     Labs on Admission: I have personally reviewed following labs and  imaging studies  CBC: Recent Labs  Lab 03/23/20 0352  WBC 11.7*  NEUTROABS 8.9*  HGB 12.9  HCT 38.8  MCV 87.4  PLT 281   Basic Metabolic Panel: Recent Labs  Lab 03/23/20 0352  [place] [place] 138  K 3.5  CL 101  CO2 28  GLUCOSE 95  BUN 12  CREATININE 0.67  CALCIUM 8.9   GFR: CrCl cannot be calculated (Unknown ideal weight.). Liver Function Tests: Recent Labs  Lab 03/23/20 0352  AST 21  ALT 43  ALKPHOS 137*  BILITOT 0.5  PROT 7.6  ALBUMIN 3.5   No results for input(s): LIPASE, AMYLASE in the last 168 hours. No results for input(s): AMMONIA in the last 168 hours. Coagulation Profile: No results for input(s): INR, PROTIME in the last 168 hours. Cardiac Enzymes: No results for input(s): CKTOTAL, CKMB, CKMBINDEX, TROPONINI in the last 168 hours. BNP (last 3 results) No results for input(s): PROBNP in the last 8760 hours. HbA1C: No results for input(s): HGBA1C in the last 72 hours. CBG: No results for input(s): GLUCAP in the last 168 hours. Lipid Profile: No results for input(s): CHOL, HDL, LDLCALC, TRIG, CHOLHDL, LDLDIRECT in the last 72 hours. Thyroid Function Tests:  No results for input(s): TSH, T4TOTAL, FREET4, T3FREE, THYROIDAB in the last 72 hours. Anemia Panel: No results for input(s): VITAMINB12, FOLATE, FERRITIN, TIBC, IRON, RETICCTPCT in the last 72 hours. Urine analysis:    Component Value Date/Time   COLORURINE YELLOW 03/23/2020 0356   APPEARANCEUR CLEAR 03/23/2020 0356   LABSPEC 1.020 03/23/2020 0356   PHURINE 7.0 03/23/2020 0356   GLUCOSEU NEGATIVE 03/23/2020 0356   HGBUR NEGATIVE 03/23/2020 0356   BILIRUBINUR NEGATIVE 03/23/2020 0356   KETONESUR NEGATIVE 03/23/2020 0356   PROTEINUR NEGATIVE 03/23/2020 0356   UROBILINOGEN 0.2 07/02/2015 1044   NITRITE NEGATIVE 03/23/2020 0356   LEUKOCYTESUR NEGATIVE 03/23/2020 0356   Sepsis Labs: !!!!!!!!!!!!!!!!!!!!!!!!!!!!!!!!!!!!!!!!!!!! @LABRCNTIP (procalcitonin:4,lacticidven:4) )No results found for this or  any previous visit (from the past 240 hour(s)).   Radiological Exams on Admission: DG Elbow Complete Left  Result Date: 03/23/2020 CLINICAL DATA:  LEFT upper arm pain for 5 days. Redness and swelling. EXAM: LEFT ELBOW - COMPLETE 3+ VIEW COMPARISON:  None. FINDINGS: Osseous alignment is normal. No acute appearing osseous abnormality. No evidence of osteomyelitis. No evidence of joint effusion. Soft tissues about the LEFT elbow are unremarkable. No soft tissue gas appreciated. IMPRESSION: Negative. Electronically Signed   By: 03/25/2020 M.D.   On: 03/23/2020 08:01   CT Extrem Up Entire Arm L W/CM  Result Date: 03/23/2020 CLINICAL DATA:  Left arm infection. IV drug user. EXAM: CT OF THE UPPER LEFT EXTREMITY WITH CONTRAST TECHNIQUE: Multidetector CT imaging of the left upper extremity was performed according to the standard protocol following intravenous contrast administration. CONTRAST:  03/25/2020 OMNIPAQUE IOHEXOL 300 MG/ML  SOLN COMPARISON:  X-ray 03/23/2020 FINDINGS: Bones/Joint/Cartilage No acute fracture. No dislocation. No significant arthropathy. No cortical destruction or periostitis. No elbow joint effusion. Ligaments Suboptimally assessed by CT. Muscles and Tendons There is a large complex rim enhancing intramuscular fluid collection extending from the level of the mid biceps through the antecubital fossa and into the proximal flexor muscle compartment of the left forearm. Collection does appear contiguous with some lobulated and possibly loculated components. There is no gas evident within the collection. The component within the mid to distal biceps muscle measures approximately 4.8 x 2.7 x 7.5 cm (series 3, image 84; series 602, image 30). The intramuscular collection which extends into the proximal forearm appears primarily located within the brachial radialis muscle. Forearm component measures approximately 5.3 x 2.9 x 6.9 cm (series 3, image 64; series 602, image 15). Tendinous structures  appear grossly intact. Soft tissues There is overlying skin thickening and induration within the subcutaneous fat compatible with cellulitis. No soft tissue gas. No radiopaque soft tissue foreign body. The deep venous structures of the upper extremity appear appropriately opacified. IMPRESSION: 1. Large complex rim enhancing intramuscular fluid collection extending from the level of the mid biceps through the antecubital fossa and into the proximal flexor muscle compartment of the left forearm. Collection does appear contiguous with some lobulated and possibly loculated components. There is no gas evident within the collection. Findings are compatible with abscess. The component within the biceps measures 4.8 x 2.7 x 7.5 cm (volume = 51 cm^3). The forearm component measures 5.3 x 2.9 x 6.9 cm (volume = 56 cm^3). 2. No elbow joint effusion to suggest septic arthritis. No evidence of osteomyelitis. Electronically Signed   By: 03/25/2020 D.O.   On: 03/23/2020 10:41     All images have been reviewed by me personally.  EKG: pending  Assessment/Plan Active Problems:   Abscess of arm,  left   # Left arm abscesses # IVDU-meth and/or heroin #Polysubstance abuse  CT left arm showed   . Large complex rim enhancing intramuscular fluid collection extending from the level of the mid biceps through the antecubital fossa and into the proximal flexor muscle compartment of the left forearm. Collection does appear contiguous with some lobulated and possibly loculated components. There is no gas evident within the collection. Findings are compatible with abscess. The component within the biceps measures 4.8 x 2.7 x 7.5 cm (volume = 51 cm^3). The forearm component measures 5.3 x 2.9 x 6.9 cm (volume = 56 cm^3).  - admit inpatient -Blood cultures are pending -Normal lactic acid -WBC 11.7 -Covid-19 pending - UDS pending -N.p.o.  - orthopedic consult-I spoke to [facility] on-call orthopedic surgeon Dr.  Magnus Ivan.  We will keep patient n.p.o. and obtain rapid Covid-19 test pending surgical evaluation -Broad-spectrum antibiotics-vancomycin and cefepime - given her IVDU and abscess, I will not be surprised if her blood cultures turned positive.   #Tobacco abuse  -She admits smoking cigarettes half pack a day -Nicotine patch  #History of hepatitis C -chronic and stable            DVT prophylaxis: SCD pending surgical evaluation Code Status: Full code Family Communication: Patient is alert and oriented x3.  No family is at bedside. Consults called: Orthopedic surgery Admission status: Inpatient  Status is: Inpatient  Remains inpatient appropriate because:Inpatient level of care appropriate due to severity of illness   Dispo: The patient is from: Home              Anticipated d/c is to: Home              Anticipated d/c date is: 2 days              Patient currently is not medically stable to d/c.        Time Spent: 65 minutes.  >50% of the time was devoted to discussing the patients care, assessment, plan and disposition with other care givers along with counseling the patient about the risks and benefits of treatment.    Dede Query MD [facility]  If 7PM-7AM, please contact night-coverage   03/23/2020, 11:03 AM

## 2020-03-23 NOTE — ED Provider Notes (Addendum)
[facility]-EMERGENCY DEPT Provider Note   CSN: [id number] Arrival date & time: 03/23/20  0019     History Chief Complaint  Patient presents with  . Arm Swelling    Cassandra Roberts is a 29 y.o. female history of IV drug use presents today for concern of left arm infection.  Patient reports that over the past 1 week she has increasing pain swelling and erythema around the left [facility].  She reports that she has been taking antibiotics her friend gave her for several days without relief.  She describes severe throbbing ache constant worsened with movement palpation no alleviating factors pain radiates up left arm to left shoulder.  Patient reports last injection in the left arm was 2 days ago.  Reports feeling warm at home has not measured a fever, denies headache, chest pain, shortness of breath, cough, abdominal pain, nausea/vomiting, numbness/tingling, weakness or any additional concerns.  HPI     Past Medical History:  Diagnosis Date  . Attention deficit disorder (ADD)   . Hepatitis C     Patient Active Problem List   Diagnosis Date Noted  . Substance abuse (HCC) 08/21/2013  . Depression 08/21/2013  . Suicidal ideation 08/21/2013  . Mood disorder (HCC) 08/21/2013  . ADD (attention deficit disorder) 12/15/2012    Past Surgical History:  Procedure Laterality Date  . APPENDECTOMY       OB History   No obstetric history on file.     History reviewed. No pertinent family history.  Social History   Tobacco Use  . Smoking status: Current Every Day Smoker    Packs/day: 0.25    Years: 2.00    Pack years: 0.50    Types: Cigarettes  . Smokeless tobacco: Never Used  Substance Use Topics  . Alcohol use: Yes    Comment: daily  . Drug use: Yes    Frequency: 1.0 times per week    Types: Marijuana, Cocaine    Home Medications Prior to Admission medications   Medication Sig Start Date End Date Taking? Authorizing Provider  ibuprofen (ADVIL) 200 MG  tablet Take 200 mg by mouth every 6 (six) hours as needed for moderate pain.   Yes [provider]  atomoxetine (STRATTERA) 100 MG capsule Take 1 capsule (100 mg total) by mouth daily. Patient not taking: Reported on 06/15/2015 09/04/14   Tonye Pearson, MD  atomoxetine (STRATTERA) 40 MG capsule Take 1 capsule (40 mg total) by mouth daily. After 3 days use 2 tabs a day at HS. After 2 weeks change to 100mg  tabs Patient not taking: Reported on 06/15/2015 09/04/14   09/06/14, MD  benzonatate (TESSALON) 100 MG capsule Take 1 capsule (100 mg total) by mouth every 8 (eight) hours. Patient not taking: Reported on 03/23/2020 07/02/15   07/04/15, MD  buPROPion (WELLBUTRIN XL) 150 MG 24 hr tablet TAKE 1 TABLET BY MOUTH DAILY FOR 1 WEEK, THEN INCREASE TO 2 TABLETS EACH MORNING Patient not taking: Reported on 06/15/2015 03/18/15   05/18/15, MD  doxycycline (VIBRAMYCIN) 100 MG capsule Take 1 capsule (100 mg total) by mouth 2 (two) times daily. One po bid x 7 days Patient not taking: Reported on 03/23/2020 12/28/19   12/30/19, MD  ibuprofen (ADVIL,MOTRIN) 800 MG tablet Take 1 tablet (800 mg total) by mouth 3 (three) times daily. Patient not taking: Reported on 06/15/2015 10/22/13   10/24/13, PA-C  metroNIDAZOLE (FLAGYL) 500 MG tablet Take 1 tablet (500 mg total)  by mouth 2 (two) times daily. One po bid x 7 days Patient not taking: Reported on 03/23/2020 07/02/15   Mirian Mo, MD  naloxone [facility]) 1 MG/ML injection Inject 0.4 mLs (0.4 mg total) into the vein as needed. Patient not taking: Reported on 03/23/2020 06/15/15   Palumbo, April, MD    Allergies    Dextromethorphan  Review of Systems   Review of Systems Ten systems are reviewed and are negative for acute change except as noted in the HPI  Physical Exam Updated Vital Signs BP 125/86   Pulse 98   Temp 98.9 F (37.2 C) (Oral)   Resp 18   LMP 02/25/2020   SpO2 99%   Physical Exam Constitutional:       General: She is not in acute distress.    Appearance: Normal appearance. She is well-developed. She is not ill-appearing or diaphoretic.  HENT:     Head: Normocephalic and atraumatic.     Right Ear: External ear normal.     Left Ear: External ear normal.     Nose: Nose normal.  Eyes:     General: Vision grossly intact. Gaze aligned appropriately.     Pupils: Pupils are equal, round, and reactive to light.  Neck:     Trachea: Trachea and phonation normal. No tracheal deviation.  Pulmonary:     Effort: Pulmonary effort is normal. No respiratory distress.  Abdominal:     General: There is no distension.     Palpations: Abdomen is soft.     Tenderness: There is no abdominal tenderness. There is no guarding or rebound.  Musculoskeletal:        General: Normal range of motion.     Cervical back: Normal range of motion.     Comments: Erythema swelling and induration extending from just distal to left AC extending halfway up left upper arm, significant erythema present multiple injection sites.  Sensation capillary refill intact to all fingers strong equal radial pulse, grip strength intact.  Unable to range of motion at the left elbow secondary to pain.  Skin:    General: Skin is warm and dry.  Neurological:     Mental Status: She is alert.     GCS: GCS eye subscore is 4. GCS verbal subscore is 5. GCS motor subscore is 6.     Comments: Speech is clear and goal oriented, follows commands Major Cranial nerves without deficit, no facial droop Moves extremities without ataxia, coordination intact  Psychiatric:        Behavior: Behavior normal.     ED Results / Procedures / Treatments   Labs (all labs ordered are listed, but only abnormal results are displayed) Labs Reviewed  COMPREHENSIVE METABOLIC PANEL - Abnormal; Notable for the following components:      Result Value   Alkaline Phosphatase 137 (*)    All other components within normal limits  CBC WITH DIFFERENTIAL/PLATELET -  Abnormal; Notable for the following components:   WBC 11.7 (*)    Neutro Abs 8.9 (*)    All other components within normal limits  SARS CORONAVIRUS 2 BY RT PCR (HOSPITAL ORDER, PERFORMED IN [facility])  LACTIC ACID, PLASMA  URINALYSIS, ROUTINE W REFLEX MICROSCOPIC  I-STAT BETA HCG BLOOD, ED ([facility], [facility], [facility] ONLY)    EKG None  Radiology DG Elbow Complete Left  Result Date: 03/23/2020 CLINICAL DATA:  LEFT upper arm pain for 5 days. Redness and swelling. EXAM: LEFT ELBOW - COMPLETE 3+ VIEW COMPARISON:  None. FINDINGS: Osseous alignment is normal. No acute appearing osseous abnormality. No evidence of osteomyelitis. No evidence of joint effusion. Soft tissues about the LEFT elbow are unremarkable. No soft tissue gas appreciated. IMPRESSION: Negative. Electronically Signed   By: Bary Richard M.D.   On: 03/23/2020 08:01    Procedures Procedures (including critical care time)  Medications Ordered in ED Medications  sodium chloride (PF) 0.9 % injection (has no administration in time range)  nicotine (NICODERM CQ - dosed in mg/24 hours) patch 14 mg (has no administration in time range)  sodium chloride flush (NS) 0.9 % injection 3 mL (3 mLs Intravenous Given 03/23/20 0924)  vancomycin (VANCOCIN) IVPB 1000 mg/200 mL premix (1,000 mg Intravenous New Bag/Given 03/23/20 0924)  morphine 4 MG/ML injection 4 mg (4 mg Intravenous Given 03/23/20 0923)  iohexol (OMNIPAQUE) 300 MG/ML solution 100 mL (100 mLs Intravenous Contrast Given 03/23/20 1287)    ED Course  I have reviewed the triage vital signs and the nursing notes.  Pertinent labs & imaging results that were available during my care of the patient were reviewed by me and considered in my medical decision making (see chart for details).  Clinical Course as of Mar 23 1032  Sun Mar 23, 2020  1020 Dr. Dierdre Searles   [BM]    Clinical Course User Index [BM] Elizabeth Palau   MDM Rules/Calculators/A&P                     Additional  History Obtained: Nursing notes from this visit. Prior ED visit on 12/28/2019.  Patient with diagnosis of cellulitis and abscess of the left upper extremity.  She was discharged on doxycycline 100 mg twice daily x7 days.  She denies any performed at that time, 5 cm abscess.  She was offered CT at that time but declined.  Lab Tests: I reviewed and interpreted labs which include: Negative pregnancy test Urinalysis without evidence of infection CMP without emergent electrolyte derangement, evidence of acute kidney injury or emergent elevation of LFTs CBC shows mild leukocytosis of 11.7 which I suspect is secondary to cellulitis, no evidence of anemia  Imaging Tests: I ordered imaging studies which include: Left elbow x-ray and CT left humerus  DG Left Elbow:  IMPRESSION:  Negative.  I have personally reviewed left elbow x-ray and agree with radiologist interpretation.   ED Course: Patient with worsening left arm infection.  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  Patient is nontoxic-appearing does not appear septic and vital/labs are reassuring however patient appears to failed outpatient treatment.  Plan of care is obtaining CT of the left arm, starting patient on IV vancomycin and admission to the hospital.  Patient was seen and evaluated by Dr. Clarene Duke who agrees with plan. - 10:20 AM: Discussed case with Dr. Dierdre Searles, patient has been accepted to hospitalist service. CT and covid test pending. - Patient reevaluated resting comfortably in bed, requesting nicotine patch.  She states understanding of care plan and is agreeable for admission.  Vital signs stable. - Addendum: Patient taking left over cephalosporin not doxycycline.  CT LUE:  IMPRESSION:  1. Large complex rim enhancing intramuscular fluid collection  extending from the level of the mid biceps through the antecubital  fossa and into the proximal flexor muscle compartment of the left   forearm. Collection does appear contiguous with some lobulated and  possibly loculated components. There is no gas evident within the  collection. Findings are compatible with  abscess. The component  within the biceps measures 4.8 x 2.7 x 7.5 cm (volume = 51 cm^3).  The forearm component measures 5.3 x 2.9 x 6.9 cm (volume = 56  cm^3).  2. No elbow joint effusion to suggest septic arthritis. No evidence  of osteomyelitis.   Discussed with Dr. Nicoletta Dress; Dr. Nicoletta Dress consulted ortho, patient NPO, covid pending.    Note: Portions of this report may have been transcribed using voice recognition software. Every effort was made to ensure accuracy; however, inadvertent computerized transcription errors may still be present.  Final Clinical Impression(s) / ED Diagnoses Final diagnoses:  None    Rx / DC Orders ED Discharge Orders    None       Gari Crown 03/23/20 1033    Gari Crown 03/23/20 1120    Little, Wenda Overland, MD 03/23/20 1447

## 2020-03-23 NOTE — ED Notes (Signed)
ED Provider at bedside. 

## 2020-03-23 NOTE — Transfer of Care (Signed)
Immediate Anesthesia Transfer of Care Note  Patient: Cassandra Roberts  Procedure(s) Performed: MINOR IRRIGATION AND DEBRIDEMENT OF WOUND (Left Arm Upper)  Patient Location: PACU  Anesthesia Type:General  Level of Consciousness: awake, alert , oriented and patient cooperative  Airway & Oxygen Therapy: Patient Spontanous Breathing and Patient connected to face mask oxygen  Post-op Assessment: Report given to RN, Post -op Vital signs reviewed and stable and Patient moving all extremities  Post vital signs: Reviewed and stable  Last Vitals:  Vitals Value Taken Time  BP 133/88 03/23/20 1455  Temp    Pulse 105 03/23/20 1458  Resp 24 03/23/20 1458  SpO2 100 % 03/23/20 1458  Vitals shown include unvalidated device data.  Last Pain:  Vitals:   03/23/20 1205  TempSrc:   PainSc: 5          Complications: No apparent anesthesia complications

## 2020-03-23 NOTE — Consult Note (Signed)
Reason for Consult: Left arm abscess Referring Physician: Dr. Charlann Lange, [facility]  Cassandra Roberts is an 29 y.o. female.  HPI:   The patient is a 29 year old with a history of IV drug abuse who presented to the emergency room early this morning with a obvious infection involving her left upper extremity.  A CT scan was obtained consistent with a large abscess over the biceps area extending into the antecubital fossa and just distal to this area.  The patient does report severe pain.  She has used heroin in the last 24 hours.  She is not using this arm.  I did see from notes that she had been into the emergency room in the [facility] system back in February with a similar area concerning for an abscess and was instructed on oral antibiotics.  It does not seem to have improved since then and has gotten significantly worse.  Her pain is quite severe.  Orthopedic surgery was consulted for evaluation treatment of the left arm abscess.  Past Medical History:  Diagnosis Date  . Attention deficit disorder (ADD)   . Hepatitis C     Past Surgical History:  Procedure Laterality Date  . APPENDECTOMY      History reviewed. No pertinent family history.  Social History:  reports that she has been smoking cigarettes. She has a 0.50 pack-year smoking history. She has never used smokeless tobacco. She reports previous alcohol use. She reports current drug use. Frequency: 1.00 time per week. Drugs: Marijuana and Cocaine.  Allergies:  Allergies  Allergen Reactions  . Dextromethorphan     Shaking    Medications: I have reviewed the patient's current medications.  Results for orders placed or performed during the hospital encounter of 03/23/20 (from the past 48 hour(s))  Lactic acid, plasma     Status: None   Collection Time: 03/23/20  3:49 AM  Result Value Ref Range   Lactic Acid, Venous 1.6 0.5 - 1.9 mmol/L    Comment: Performed at [facility], [place] [address], [place], [place]  [address]  Comprehensive metabolic panel     Status: Abnormal   Collection Time: 03/23/20  3:52 AM  Result Value Ref Range   Sodium 138 135 - 145 mmol/L   Potassium 3.5 3.5 - 5.1 mmol/L   Chloride 101 98 - 111 mmol/L   CO2 28 22 - 32 mmol/L   Glucose, Bld 95 70 - 99 mg/dL    Comment: Glucose reference range applies only to samples taken after fasting for at least 8 hours.   BUN 12 6 - 20 mg/dL   Creatinine, Ser 0.67 0.44 - 1.00 mg/dL   Calcium 8.9 8.9 - 10.3 mg/dL   Total Protein 7.6 6.5 - 8.1 g/dL   Albumin 3.5 3.5 - 5.0 g/dL   AST 21 15 - 41 U/L   ALT 43 0 - 44 U/L   Alkaline Phosphatase 137 (H) 38 - 126 U/L   Total Bilirubin 0.5 0.3 - 1.2 mg/dL   GFR calc non Af Amer >60 >60 mL/min   GFR calc Af Amer >60 >60 mL/min   Anion gap 9 5 - 15    Comment: Performed at [facility], [place] [address], [place], [place] [address]  CBC with Differential     Status: Abnormal   Collection Time: 03/23/20  3:52 AM  Result Value Ref Range   WBC 11.7 (H) 4.0 - 10.5 K/uL   RBC 4.44 3.87 - 5.11 MIL/uL  Hemoglobin 12.9 12.0 - 15.0 g/dL   HCT 63.1 49.7 - 02.6 %   MCV 87.4 80.0 - 100.0 fL   MCH 29.1 26.0 - 34.0 pg   MCHC 33.2 30.0 - 36.0 g/dL   RDW 37.8 58.8 - 50.2 %   Platelets 281 150 - 400 K/uL   nRBC 0.0 0.0 - 0.2 %   Neutrophils Relative % 76 %   Neutro Abs 8.9 (H) 1.7 - 7.7 K/uL   Lymphocytes Relative 14 %   Lymphs Abs 1.6 0.7 - 4.0 K/uL   Monocytes Relative 8 %   Monocytes Absolute 0.9 0.1 - 1.0 K/uL   Eosinophils Relative 2 %   Eosinophils Absolute 0.2 0.0 - 0.5 K/uL   Basophils Relative 0 %   Basophils Absolute 0.0 0.0 - 0.1 K/uL   Immature Granulocytes 0 %   Abs Immature Granulocytes 0.05 0.00 - 0.07 K/uL    Comment: Performed at [facility], [address], [place], [place] [address]  Urinalysis, Routine w reflex microscopic     Status: None   Collection Time: 03/23/20  3:56 AM  Result Value Ref Range   Color, Urine YELLOW YELLOW    APPearance CLEAR CLEAR   Specific Gravity, Urine 1.020 1.005 - 1.030   pH 7.0 5.0 - 8.0   Glucose, UA NEGATIVE NEGATIVE mg/dL   Hgb urine dipstick NEGATIVE NEGATIVE   Bilirubin Urine NEGATIVE NEGATIVE   Ketones, ur NEGATIVE NEGATIVE mg/dL   Protein, ur NEGATIVE NEGATIVE mg/dL   Nitrite NEGATIVE NEGATIVE   Leukocytes,Ua NEGATIVE NEGATIVE    Comment: Performed at [facility], [address] [place], [place] [address]  I-Stat beta hCG blood, ED     Status: None   Collection Time: 03/23/20  4:11 AM  Result Value Ref Range   I-stat hCG, quantitative <5.0 <5 mIU/mL   Comment 3            Comment:   GEST. AGE      CONC.  (mIU/mL)   <=1 WEEK        5 - 50     2 WEEKS       50 - 500     3 WEEKS       100 - 10,000     4 WEEKS     1,000 - 30,000        FEMALE AND NON-PREGNANT FEMALE:     LESS THAN 5 mIU/mL     DG Elbow Complete Left  Result Date: 03/23/2020 CLINICAL DATA:  LEFT upper arm pain for 5 days. Redness and swelling. EXAM: LEFT ELBOW - COMPLETE 3+ VIEW COMPARISON:  None. FINDINGS: Osseous alignment is normal. No acute appearing osseous abnormality. No evidence of osteomyelitis. No evidence of joint effusion. Soft tissues about the LEFT elbow are unremarkable. No soft tissue gas appreciated. IMPRESSION: Negative. Electronically Signed   By: Bary Richard M.D.   On: 03/23/2020 08:01   CT Extrem Up Entire Arm L W/CM  Result Date: 03/23/2020 CLINICAL DATA:  Left arm infection. IV drug user. EXAM: CT OF THE UPPER LEFT EXTREMITY WITH CONTRAST TECHNIQUE: Multidetector CT imaging of the left upper extremity was performed according to the standard protocol following intravenous contrast administration. CONTRAST:  OMNIPAQUE IOHEXOL 300 MG/ML  SOLN COMPARISON:  X-ray 03/23/2020 FINDINGS: Bones/Joint/Cartilage No acute fracture. No dislocation. No significant arthropathy. No cortical destruction or periostitis. No elbow joint effusion. Ligaments Suboptimally assessed by  CT. Muscles and Tendons There is  a large complex rim enhancing intramuscular fluid collection extending from the level of the mid biceps through the antecubital fossa and into the proximal flexor muscle compartment of the left forearm. Collection does appear contiguous with some lobulated and possibly loculated components. There is no gas evident within the collection. The component within the mid to distal biceps muscle measures approximately 4.8 x 2.7 x 7.5 cm (series 3, image 84; series 602, image 30). The intramuscular collection which extends into the proximal forearm appears primarily located within the brachial radialis muscle. Forearm component measures approximately 5.3 x 2.9 x 6.9 cm (series 3, image 64; series 602, image 15). Tendinous structures appear grossly intact. Soft tissues There is overlying skin thickening and induration within the subcutaneous fat compatible with cellulitis. No soft tissue gas. No radiopaque soft tissue foreign body. The deep venous structures of the upper extremity appear appropriately opacified. IMPRESSION: 1. Large complex rim enhancing intramuscular fluid collection extending from the level of the mid biceps through the antecubital fossa and into the proximal flexor muscle compartment of the left forearm. Collection does appear contiguous with some lobulated and possibly loculated components. There is no gas evident within the collection. Findings are compatible with abscess. The component within the biceps measures 4.8 x 2.7 x 7.5 cm (volume = 51 cm^3). The forearm component measures 5.3 x 2.9 x 6.9 cm (volume = 56 cm^3). 2. No elbow joint effusion to suggest septic arthritis. No evidence of osteomyelitis. Electronically Signed   By: Duanne Guess D.O.   On: 03/23/2020 10:41    Review of Systems Blood pressure 125/86, pulse 98, temperature 98.9 F (37.2 C), temperature source Oral, resp. rate 18, height 5\' 2"  (1.575 m), weight 56.7 kg, last menstrual period  02/25/2020, SpO2 99 %. Physical Exam  Constitutional: She is oriented to person, place, and time.  Musculoskeletal:     Left elbow: Swelling present. Tenderness present.       Arms:  Neurological: She is alert and oriented to person, place, and time.   The patient is pacing around the room.  She has obvious discomfort and pain from her left arm abscess.  On clinical exam it is obvious that there is a large area of fullness in the soft tissue consistent with an abscess from IV drug abuse.  There is needle tracks in her arm on both sides in her hand.  She is able to move her fingers and thumb and has normal sensation over hand.   Assessment/Plan: Left arm abscess from IV drug abuse  I spoke with the patient in length about the need for an urgent irrigation debridement of her left arm.  I have communicated this with Dr. 02/27/2020 as well.  She will need to be hospitalized following this surgery for IV antibiotics and even potentially a second surgery and 48 to 72 hours later depending on her ability to get this infection under control.  The patient does exhibit understanding of this.  Dierdre Searles 03/23/2020, 12:16 PM

## 2020-03-23 NOTE — Progress Notes (Signed)
TRH Progress note   Notified by RN of signs of withdrawal this evening with increased anxiety and restlessness. Spoke with pharmacy and decision made to add Clonidine detox taper to patient regiment. Also added Clinical opiate withdrawal score q4hrs.   Delfin Gant, NP-C Contact / Pager information can be found on Amion  03/23/2020, 10:34 PM

## 2020-03-23 NOTE — Progress Notes (Signed)
A consult was received from an ED physician for vanc per pharmacy dosing.  The patient's profile has been reviewed for ht/wt/allergies/indication/available labs.   A one time order has been placed for vanc 1g.  Further antibiotics/pharmacy consults should be ordered by admitting physician if indicated.                       Thank you, Berkley Harvey 03/23/2020  8:08 AM

## 2020-03-23 NOTE — ED Notes (Signed)
Patient transported to CT 

## 2020-03-23 NOTE — Brief Op Note (Signed)
03/23/2020  2:40 PM  PATIENT:  Cassandra Roberts  29 y.o. female  PRE-OPERATIVE DIAGNOSIS:  infected left arm  POST-OPERATIVE DIAGNOSIS:  Left upper arm and forearm abscess PROCEDURE:   Irrigation/Debridement Left Upper Arm and Forearm Abscess  SURGEON:  Surgeon(s) and Role:    Kathryne Hitch, MD - Primary  PHYSICIAN ASSISTANT: Rexene Edison, PA-C  ANESTHESIA:   general  COUNTS:  YES  PLAN OF CARE: Admit to inpatient   PATIENT DISPOSITION:  PACU - hemodynamically stable.   Delay start of Pharmacological VTE agent (>24hrs) due to surgical blood loss or risk of bleeding: no

## 2020-03-23 NOTE — Anesthesia Preprocedure Evaluation (Signed)
Anesthesia Evaluation  Patient identified by MRN, date of birth, ID band Patient awake    Reviewed: Allergy & Precautions, NPO status , Patient's Chart, lab work & pertinent test results  History of Anesthesia Complications Negative for: history of anesthetic complications  Airway Mallampati: II  TM Distance: >3 FB Neck ROM: Full    Dental  (+) Dental Advisory Given   Pulmonary neg recent URI, Current SmokerPatient did not abstain from smoking.,    breath sounds clear to auscultation       Cardiovascular negative cardio ROS   Rhythm:Regular     Neuro/Psych negative neurological ROS  negative psych ROS   GI/Hepatic negative GI ROS, (+)     substance abuse  IV drug use, Hepatitis -, C  Endo/Other  negative endocrine ROS  Renal/GU negative Renal ROS  negative genitourinary   Musculoskeletal negative musculoskeletal ROS (+) Left arm infection   Abdominal   Peds  Hematology negative hematology ROS (+)   Anesthesia Other Findings   Reproductive/Obstetrics                             Anesthesia Physical Anesthesia Plan  ASA: II  Anesthesia Plan: General   Post-op Pain Management:    Induction: Intravenous, Rapid sequence and Cricoid pressure planned  PONV Risk Score and Plan: 2 and Ondansetron and Dexamethasone  Airway Management Planned: Oral ETT  Additional Equipment: None  Intra-op Plan:   Post-operative Plan: Extubation in OR  Informed Consent: I have reviewed the patients History and Physical, chart, labs and discussed the procedure including the risks, benefits and alternatives for the proposed anesthesia with the patient or authorized representative who has indicated his/her understanding and acceptance.     Dental advisory given  Plan Discussed with: CRNA and Surgeon  Anesthesia Plan Comments:         Anesthesia Quick Evaluation

## 2020-03-23 NOTE — ED Notes (Signed)
Provider at bedside

## 2020-03-23 NOTE — Anesthesia Procedure Notes (Signed)
Procedure Name: Intubation Date/Time: 03/23/2020 2:01 PM Performed by: Mitzie Na, CRNA Pre-anesthesia Checklist: Patient identified, Emergency Drugs available, Suction available and Patient being monitored Patient Re-evaluated:Patient Re-evaluated prior to induction Oxygen Delivery Method: Circle system utilized Preoxygenation: Pre-oxygenation with 100% oxygen Induction Type: IV induction and Rapid sequence Laryngoscope Size: Mac and 3 Grade View: Grade I Tube type: Oral Tube size: 7.0 mm Number of attempts: 1 Airway Equipment and Method: Stylet and Oral airway Placement Confirmation: ETT inserted through vocal cords under direct vision,  positive ETCO2 and breath sounds checked- equal and bilateral Secured at: 24 cm Tube secured with: Tape Dental Injury: Teeth and Oropharynx as per pre-operative assessment

## 2020-03-24 ENCOUNTER — Encounter: Payer: Self-pay | Admitting: *Deleted

## 2020-03-24 DIAGNOSIS — Z8619 Personal history of other infectious and parasitic diseases: Secondary | ICD-10-CM

## 2020-03-24 DIAGNOSIS — Z720 Tobacco use: Secondary | ICD-10-CM

## 2020-03-24 DIAGNOSIS — F1910 Other psychoactive substance abuse, uncomplicated: Secondary | ICD-10-CM

## 2020-03-24 DIAGNOSIS — F1990 Other psychoactive substance use, unspecified, uncomplicated: Secondary | ICD-10-CM

## 2020-03-24 LAB — CBC
HCT: 35.8 % — ABNORMAL LOW (ref 36.0–46.0)
Hemoglobin: 12.4 g/dL (ref 12.0–15.0)
MCH: 29.2 pg (ref 26.0–34.0)
MCHC: 34.6 g/dL (ref 30.0–36.0)
MCV: 84.2 fL (ref 80.0–100.0)
Platelets: 425 10*3/uL — ABNORMAL HIGH (ref 150–400)
RBC: 4.25 MIL/uL (ref 3.87–5.11)
RDW: 13.3 % (ref 11.5–15.5)
WBC: 20.3 10*3/uL — ABNORMAL HIGH (ref 4.0–10.5)
nRBC: 0 % (ref 0.0–0.2)

## 2020-03-24 LAB — TYPE AND SCREEN
ABO/RH(D): O POS
Antibody Screen: NEGATIVE

## 2020-03-24 LAB — COMPREHENSIVE METABOLIC PANEL
ALT: 34 U/L (ref 0–44)
AST: 19 U/L (ref 15–41)
Albumin: 2.9 g/dL — ABNORMAL LOW (ref 3.5–5.0)
Alkaline Phosphatase: 132 U/L — ABNORMAL HIGH (ref 38–126)
Anion gap: 10 (ref 5–15)
BUN: 13 mg/dL (ref 6–20)
CO2: 23 mmol/L (ref 22–32)
Calcium: 8.8 mg/dL — ABNORMAL LOW (ref 8.9–10.3)
Chloride: 105 mmol/L (ref 98–111)
Creatinine, Ser: 0.54 mg/dL (ref 0.44–1.00)
GFR calc Af Amer: >60 mL/min (ref >60)
GFR calc non Af Amer: >60 mL/min (ref >60)
Glucose, Bld: 119 mg/dL — ABNORMAL HIGH (ref 70–99)
Potassium: 4.7 mmol/L (ref 3.5–5.1)
Sodium: 138 mmol/L (ref 135–145)
Total Bilirubin: 0.6 mg/dL (ref 0.3–1.2)
Total Protein: 6.9 g/dL (ref 6.5–8.1)

## 2020-03-24 LAB — RAPID URINE DRUG SCREEN, HOSP PERFORMED
Amphetamines: POSITIVE — AB
Barbiturates: NOT DETECTED
Benzodiazepines: NOT DETECTED
Cocaine: NOT DETECTED
Opiates: POSITIVE — AB
Tetrahydrocannabinol: POSITIVE — AB

## 2020-03-24 LAB — ABO/RH: ABO/RH(D): O POS

## 2020-03-24 MED ORDER — LINEZOLID 600 MG PO TABS
600.0000 mg | ORAL_TABLET | Freq: Two times a day (BID) | ORAL | Status: DC
  Administered 2020-03-24: 600 mg via ORAL
  Filled 2020-03-24 (×2): qty 1

## 2020-03-24 NOTE — Progress Notes (Signed)
RN saw patient attempting to get on elevator. Pt stated she wanted to leave. RN explained the importance of staying and completing antibiotic therapy. Pt adamant that she wanted to leave. IV removed and AMA formed signed by patient. On call provider notified.

## 2020-03-24 NOTE — Op Note (Signed)
Cassandra Roberts, [facility] MEDICAL RECORD JJ:[id number] ACCOUNT [id number] DATE OF BIRTH:[birth year] FACILITY: [facility] LOCATION: [facility] PHYSICIAN:Nichols Corter Aretha Parrot, MD  OPERATIVE REPORT  DATE OF PROCEDURE:  03/23/2020  PREOPERATIVE DIAGNOSIS:  Right arm abscess due to intravenous drug abuse.  POSTOPERATIVE DIAGNOSES:  Right arm abscess due to intravenous drug abuse.  PROCEDURE:  Irrigation and debridement of left arm abscess with sharp excisional debridement of necrotic muscle only.  FINDINGS:  Large abscess from the mid to upper 1/3 biceps area down into the proximal forearm with fortunately contractile muscle; intraoperative Gram stain and cultures pending.  SURGEON:  Vanita Panda. Magnus Ivan, MD  ASSISTANT:  Richardean Canal, PA-C  ANESTHESIA:  General.  ANTIBIOTICS:  Two grams IV Ancef.  COMPLICATIONS:  None.  INDICATIONS:  The patient is a 29 year old female with a long history of IV drug abuse.  She presented to the emergency room early this morning with an obvious abscess of her right upper extremity.  This was confirmed with CT scan when even on clinical  exam, you could see there was obvious flexion contracture of her elbow due to contracting of tissues of the biceps.  There is induration of the skin with redness and cellulitis all around the proximal biceps and into the proximal forearm, distally.   Distally, her motor and sensory exam is normal in her hand.  Her hand was well perfused.  I told her in length about her IV drug abuse, but also talked to her about the need for an urgent irrigation and debridement of the abscess.  She does consent for  this given the pain she is having and the obvious abscess she has in her left arm.  She is being admitted to the hospitalist service, graciously.  DESCRIPTION OF PROCEDURE:  After informed consent was obtained and appropriate left arm was marked she was brought to the operating room and placed supine on the operating table, left arm on  an arm table.  General anesthesia was then obtained.  Her left  axillary area and upper arm and lower arm to the wrist were prepped and draped with DuraPrep and sterile drapes, including a sterile stockinette.  A timeout was called and she was identified as correct patient, correct left arm.  I then made a  curvilinear incision over the mid biceps area and carried this straight down the arm and then curved this slightly ulnarly.  As I got into the deep tissues, I encountered a very large purulent abscess that accompanied all of the anterior compartment of  the upper arm and then the volar compartment of the lower arm.  I was able to meticulously dissect through all the tissues to release as much abscess as we could and we irrigated it all out thoroughly with 2 liters normal saline solution.  Once we  suctioned all the abscess and then irrigated all the tissues, we explored the tissues further and found no other pockets of abscess or gross purulence.  I then used electrocautery and found the majority of the biceps and biceps brachii muscle to be  intact and contractile.  There was only a small amount of muscle that we sharply debrided with a scalpel.  The remainder of the muscles were contractile.  We then loosely reapproximated her skin incision with interrupted 2-0 nylon suture.  Xeroform  well-padded sterile dressing was applied.  She was awakened, extubated, and taken to recovery room in stable condition.  All final counts were correct.  No complications noted.  Postoperatively, she will be  admitted for IV antibiotics.  If she responds  well to antibiotics, there is a good chance we will not need to take her back to the operating room during this hospitalization.  We will follow her closely.  CN/NUANCE  D:03/23/2020 T:03/24/2020 JOB:[id number]

## 2020-03-24 NOTE — Progress Notes (Signed)
PROGRESS NOTE    Cassandra Roberts  MVE:[id number] DOB: 04-11-1991 DOA: 03/23/2020 PCP: Patient, No Pcp Per   Brief Narrative:   Cassandra Roberts is a 29 y.o. female with medical history significant of IV drug abuse, polysubstance abuse including methamphetamine and heroin, tobacco abuse 1/2 pack daily, hepatitis C, who presented with left arm redness and pain. Patient admits IVDU with either methamphetamine or heroin.  Her last use was IV heroin IVDU yesterday on 03/22/2020.  Patient reports that she has had progressively worsening of left arm redness and pain over last 3-4 days.  The redness started from her mid arm just below antecubital area, and progressively worsened upwards to her mid upper arm area.  Associated symptoms included left arm swelling and warm to touch.  Patient's friend gave her some cephalexin 500 mg tablets, and she has been taking it 4 times a day for last 3 days. Denies fevers, chills, sick contact, chest pain/pressure, shortness of breath, cough, wheezing, nausea, vomiting, diarrhea, abdominal pain, dysuria, urinary frequency or urgency.   Patient came to [facility] long ED for further evaluation today.  In the emergency room, she was afebrile with a pulse 92, RR 16, BP 124/91 and room air O2 sats 99%. The labs showed nonrevealing CMP, WBC 11.7, normal lactic acid, negative UA.  Left arm CT suggested two abscesses to left arm.  5/17: Can switch vanc to zyvox. Continue flagyl, cefepime. Ortho to reassess for possible further procedures. Hopefully she won't need any more. Continue this combo of abx for next 24 hours. Clonidine protocol for withdrawal. No changes otherwise.    Assessment & Plan:   Principal Problem:   Abscess of arm, left Active Problems:   ADD (attention deficit disorder)   Polysubstance abuse (HCC)   IVDU (intravenous drug user)   Tobacco abuse   History of hepatitis C virus infection  LUE abscess secondary to IVDA    - CT: Large complex rim enhancing  intramuscular fluid collection extending from the level of the mid biceps through the antecubital fossa and into the proximal flexor muscle compartment of the left forearm. Collection does appear contiguous with some lobulated and possibly loculated components. There is no gas evident within the collection. Findings are compatible with abscess. The component within the biceps measures 4.8 x 2.7 x 7.5 cm (volume = 51 cm^3).The forearm component measures 5.3 x 2.9 x 6.9 cm (volume = 56 cm^3).    - s/p I&D w/ ortho    - Bld Cx pending    - now on zyvox, cefepime, flagyl  Polysubstance abuse    - including heroin, meth, tobacco    - counseled against further use    - clonidine protocol for opioid withdrawal  Hx of Hepatitis C     - chronic stable, outpt follow up  DVT prophylaxis: SCDs Code Status: FULL   Status is: Inpatient  Remains inpatient appropriate because:IV treatments appropriate due to intensity of illness or inability to take PO   Dispo: The patient is from: Home              Anticipated d/c is to: Home              Anticipated d/c date is: 3 days              Patient currently is not medically stable to d/c.  Consultants:   Orthopedics  Procedures:   I&D  Antimicrobials:  Marland Kitchen Zyvox, cefepime, flagyl   ROS:  Denies CP,  N, V, D . Remainder 10-pt ROS is negative for all not previously mentioned.  Subjective: No acute events ON.   Objective: Vitals:   03/23/20 1616 03/23/20 2051 03/24/20 0626 03/24/20 1348  BP: (!) 137/97 (!) 122/94 (!) 134/92 118/61  Pulse: 93 87 70 90  Resp: 16 16 16 16   Temp: 98.4 F (36.9 C) 97.9 F (36.6 C) 97.9 F (36.6 C) 98.2 F (36.8 C)  TempSrc: Oral   Oral  SpO2: 99% 100% 100% 99%  Weight:      Height:        Intake/Output Summary (Last 24 hours) at 03/24/2020 1355 Last data filed at 03/23/2020 1615 Gross per 24 hour  Intake 1000 ml  Output --  Net 1000 ml   Filed Weights   03/23/20 1204  Weight: 56.7 kg     Examination:  General: 29 y.o. female resting in bed in NAD Cardiovascular: RRR, +S1, S2, no m/g/r, equal pulses throughout Respiratory: CTABL, no w/r/r, normal WOB GI: BS+, NDNT, no masses noted, no organomegaly noted MSK: No e/c/c, LUE bandaging CDI Neuro: A&O x 3, no focal deficits Psyc: Appropriate interaction and affect, calm/cooperative   Data Reviewed: I have personally reviewed following labs and imaging studies.  CBC: Recent Labs  Lab 03/23/20 0352 03/24/20 0437  WBC 11.7* 20.3*  NEUTROABS 8.9*  --   HGB 12.9 12.4  HCT 38.8 35.8*  MCV 87.4 84.2  PLT 281 161*   Basic Metabolic Panel: Recent Labs  Lab 03/23/20 0352 03/24/20 0647  NA 138 138  K 3.5 4.7  CL 101 105  CO2 28 23  GLUCOSE 95 119*  BUN 12 13  CREATININE 0.67 0.54  CALCIUM 8.9 8.8*   GFR: Estimated Creatinine Clearance: 82.8 mL/min (by C-G formula based on SCr of 0.54 mg/dL). Liver Function Tests: Recent Labs  Lab 03/23/20 0352 03/24/20 0647  AST 21 19  ALT 43 34  ALKPHOS 137* 132*  BILITOT 0.5 0.6  PROT 7.6 6.9  ALBUMIN 3.5 2.9*   No results for input(s): LIPASE, AMYLASE in the last 168 hours. No results for input(s): AMMONIA in the last 168 hours. Coagulation Profile: No results for input(s): INR, PROTIME in the last 168 hours. Cardiac Enzymes: No results for input(s): CKTOTAL, CKMB, CKMBINDEX, TROPONINI in the last 168 hours. BNP (last 3 results) No results for input(s): PROBNP in the last 8760 hours. HbA1C: No results for input(s): HGBA1C in the last 72 hours. CBG: No results for input(s): GLUCAP in the last 168 hours. Lipid Profile: No results for input(s): CHOL, HDL, LDLCALC, TRIG, CHOLHDL, LDLDIRECT in the last 72 hours. Thyroid Function Tests: Recent Labs    03/23/20 1557  TSH 1.016   Anemia Panel: No results for input(s): VITAMINB12, FOLATE, FERRITIN, TIBC, IRON, RETICCTPCT in the last 72 hours. Sepsis Labs: Recent Labs  Lab 03/23/20 0349  LATICACIDVEN 1.6     Recent Results (from the past 240 hour(s))  SARS Coronavirus 2 by RT PCR (hospital order, performed in [facility] hospital lab) Nasopharyngeal Nasopharyngeal Swab     Status: None   Collection Time: 03/23/20 10:57 AM   Specimen: Nasopharyngeal Swab  Result Value Ref Range Status   SARS Coronavirus 2 NEGATIVE NEGATIVE Final    Comment: (NOTE) SARS-CoV-2 target nucleic acids are NOT DETECTED. The SARS-CoV-2 RNA is generally detectable in upper and lower respiratory specimens during the acute phase of infection. The lowest concentration of SARS-CoV-2 viral copies this assay can detect is 250 copies / mL. A  negative result does not preclude SARS-CoV-2 infection and should not be used as the sole basis for treatment or other patient management decisions.  A negative result may occur with improper specimen collection / handling, submission of specimen other than nasopharyngeal swab, presence of viral mutation(s) within the areas targeted by this assay, and inadequate number of viral copies (<250 copies / mL). A negative result must be combined with clinical observations, patient history, and epidemiological information. Fact Sheet for Patients:   BoilerBrush.com.cy Fact Sheet for Healthcare Providers: https://pope.com/ This test is not yet approved or cleared  by the [place] FDA and has been authorized for detection and/or diagnosis of SARS-CoV-2 by FDA under an Emergency Use Authorization (EUA).  This EUA will remain in effect (meaning this test can be used) for the duration of the COVID-19 declaration under Section 564(b)(1) of the Act, 21 U.S.C. section 360bbb-3(b)(1), unless the authorization is terminated or revoked sooner. Performed at [facility], [address], [place], [place] [address]   Aerobic/Anaerobic Culture (surgical/deep wound)     Status: None (Preliminary result)   Collection Time: 03/23/20   2:16 PM   Specimen: Wound; Abscess  Result Value Ref Range Status   Specimen Description ABSCESS LEFT ARM  Final   Special Requests   Final    NONE Performed at [facility], [address], [place], [place] [address]    Gram Stain NO WBC SEEN NO ORGANISMS SEEN   Final   Culture   Final    FEW STAPHYLOCOCCUS AUREUS CULTURE REINCUBATED FOR BETTER GROWTH Performed at [facility], [address] [place], [place] [address]    Report Status PENDING  Incomplete  Culture, blood (Routine X 2) w Reflex to ID Panel     Status: None (Preliminary result)   Collection Time: 03/23/20  3:57 PM   Specimen: BLOOD  Result Value Ref Range Status   Specimen Description   Final    BLOOD RIGHT ARM Performed at [facility], [address], [place], [place] [address]    Special Requests   Final    BOTTLES DRAWN AEROBIC ONLY Blood Culture adequate volume Performed at [facility], [address], [place], [place] [address]    Culture   Final    NO GROWTH < 24 HOURS Performed at [facility], [address], [place], [place] [address]    Report Status PENDING  Incomplete  Culture, blood (Routine X 2) w Reflex to ID Panel     Status: None (Preliminary result)   Collection Time: 03/23/20  3:57 PM   Specimen: BLOOD  Result Value Ref Range Status   Specimen Description   Final    BLOOD RIGHT HAND Performed at [facility], [address] [place], [place] [address]    Special Requests   Final    BOTTLES DRAWN AEROBIC ONLY Blood Culture adequate volume Performed at [facility], [address], [place], [place] [address]    Culture   Final    NO GROWTH < 24 HOURS Performed at [facility], [address] [place], [place] [address]    Report Status PENDING  Incomplete      Radiology Studies: DG Elbow Complete Left  Result Date: 03/23/2020 CLINICAL DATA:  LEFT upper arm pain  for 5 days. Redness and swelling. EXAM: LEFT ELBOW - COMPLETE 3+ VIEW COMPARISON:  None. FINDINGS: Osseous alignment is normal. No acute  appearing osseous abnormality. No evidence of osteomyelitis. No evidence of joint effusion. Soft tissues about the LEFT elbow are unremarkable. No soft tissue gas appreciated. IMPRESSION: Negative. Electronically Signed   By: Bary Richard M.D.   On: 03/23/2020 08:01   CT Extrem Up Entire Arm L W/CM  Result Date: 03/23/2020 CLINICAL DATA:  Left arm infection. IV drug user. EXAM: CT OF THE UPPER LEFT EXTREMITY WITH CONTRAST TECHNIQUE: Multidetector CT imaging of the left upper extremity was performed according to the standard protocol following intravenous contrast administration. CONTRAST:  OMNIPAQUE IOHEXOL 300 MG/ML  SOLN COMPARISON:  X-ray 03/23/2020 FINDINGS: Bones/Joint/Cartilage No acute fracture. No dislocation. No significant arthropathy. No cortical destruction or periostitis. No elbow joint effusion. Ligaments Suboptimally assessed by CT. Muscles and Tendons There is a large complex rim enhancing intramuscular fluid collection extending from the level of the mid biceps through the antecubital fossa and into the proximal flexor muscle compartment of the left forearm. Collection does appear contiguous with some lobulated and possibly loculated components. There is no gas evident within the collection. The component within the mid to distal biceps muscle measures approximately 4.8 x 2.7 x 7.5 cm (series 3, image 84; series 602, image 30). The intramuscular collection which extends into the proximal forearm appears primarily located within the brachial radialis muscle. Forearm component measures approximately 5.3 x 2.9 x 6.9 cm (series 3, image 64; series 602, image 15). Tendinous structures appear grossly intact. Soft tissues There is overlying skin thickening and induration within the subcutaneous fat compatible with cellulitis. No soft tissue gas. No radiopaque  soft tissue foreign body. The deep venous structures of the upper extremity appear appropriately opacified. IMPRESSION: 1. Large complex rim enhancing intramuscular fluid collection extending from the level of the mid biceps through the antecubital fossa and into the proximal flexor muscle compartment of the left forearm. Collection does appear contiguous with some lobulated and possibly loculated components. There is no gas evident within the collection. Findings are compatible with abscess. The component within the biceps measures 4.8 x 2.7 x 7.5 cm (volume = 51 cm^3). The forearm component measures 5.3 x 2.9 x 6.9 cm (volume = 56 cm^3). 2. No elbow joint effusion to suggest septic arthritis. No evidence of osteomyelitis. Electronically Signed   By: Duanne Guess D.O.   On: 03/23/2020 10:41     Scheduled Meds: . cloNIDine  0.1 mg Oral QID   Followed by  . [START ON 03/26/2020] cloNIDine  0.1 mg Oral BH-qamhs   Followed by  . [START ON 03/29/2020] cloNIDine  0.1 mg Oral QAC breakfast  . linezolid  600 mg Oral Q12H  . nicotine  14 mg Transdermal Daily  . nicotine  14 mg Transdermal Once   Continuous Infusions: . ceFEPime (MAXIPIME) IV 2 g (03/24/20 1342)  . metronidazole 500 mg (03/24/20 1143)     LOS: 1 day    Time spent: 25 minutes spent in the coordination of care today.    Teddy Spike, DO [facility]  If 7PM-7AM, please contact night-coverage [email or web address] 03/24/2020, 1:55 PM

## 2020-03-24 NOTE — TOC Initial Note (Signed)
Transition of Care [facility]) - Initial/Assessment Note    Patient Details  Name: Cassandra Roberts MRN: [id number] Date of Birth: 14-Mar-1991  Transition of [facility]) CM/SW Contact:    Lynnell Catalan, RN Phone Number: 03/24/2020, 1:37 PM  Clinical Narrative:                 [facility] consult for PCP/Substance abuse resources. This CM met with pt at bedside for dc planning. Pt requesting Suboxone clinic. This CM printed out and gave pt a list of Suboxone clinics in the area. [place] info placed on AVS. Pt is living at a hotel at this time. TOC will continue to follow and see if pt qualifies for [facility] letter for dc medications at time of DC.  Expected Discharge Plan: Home/Self Care Barriers to Discharge: Continued Medical Work up  Expected Discharge Plan and Services Expected Discharge Plan: Home/Self Care       Living arrangements for the past 2 months: Hotel/Motel                    Prior Living Arrangements/Services Living arrangements for the past 2 months: Hotel/Motel Lives with:: Self Patient language and need for interpreter reviewed:: Yes                 Activities of Daily Living Home Assistive Devices/Equipment: None ADL Screening (condition at time of admission) Patient's cognitive ability adequate to safely complete daily activities?: Yes Is the patient deaf or have difficulty hearing?: No Does the patient have difficulty seeing, even when wearing glasses/contacts?: No Does the patient have difficulty concentrating, remembering, or making decisions?: No Patient able to express need for assistance with ADLs?: No Does the patient have difficulty dressing or bathing?: No Independently performs ADLs?: Yes (appropriate for developmental age) Communication: Independent Does the patient have difficulty walking or climbing stairs?: No Weakness of Legs: None Weakness of Arms/Hands: Left  Permission Sought/Granted                  Emotional Assessment Appearance:: Appears  older than stated age Attitude/Demeanor/Rapport: Avoidant Affect (typically observed): Guarded   Alcohol / Substance Use: Illicit Drugs, Tobacco Use    Admission diagnosis:  Abscess of arm, left [L02.414] Patient Active Problem List   Diagnosis Date Noted  . Abscess of arm, left 03/23/2020  . Polysubstance abuse ([place]) 03/23/2020  . IVDU (intravenous drug user) 03/23/2020  . Tobacco abuse 03/23/2020  . History of hepatitis C virus infection 03/23/2020  . Depression 08/21/2013  . Suicidal ideation 08/21/2013  . Mood disorder ([place]) 08/21/2013  . ADD (attention deficit disorder) 12/15/2012   PCP:  Patient, No Pcp Per Pharmacy:   [facility] [place], [place] [address] [place] [address] Phone: [phone] Fax: [phone]  [place] ([place]), [place] - [address] [place] [address] Phone: [phone] Fax: [phone]  [id number] [place] [address]: [phone]: [phone] [id number] [place]: [phone]: [phone]   Social Determinants of Health (SDOH) Interventions    Readmission Risk Interventions No flowsheet data found.

## 2020-03-24 NOTE — Progress Notes (Signed)
Patient ID: Cassandra Roberts, female   DOB: Nov 28, 1990, 29 y.o.   MRN: [id number] The patient tolerated surgery well yesterday on her left arm.  An abundant amount of gross purulence was found throughout her right upper arm and proximal forearm.  Fortunately, the muscle tissue was viable.  She does report feeling significant decrease in pain in her left upper extremity today.  On exam, the dressings are clean and dry.  Her hand is well-perfused with normal motor and sensory function.  We will need to see how she responds to IV antibiotics over the next 24 hours before making a determination of whether or not additional surgery is needed this week on her left arm.  Hopefully she will respond well from the surgery yesterday and IV antibiotics to avoid any further surgical intervention.

## 2020-03-25 NOTE — Discharge Summary (Signed)
AMA NOTICE.   Per nursing ON: RN saw patient attempting to get on elevator. Pt stated she wanted to leave. RN explained the importance of staying and completing antibiotic therapy. Pt adamant that she wanted to leave. IV removed and AMA formed signed by patient. On call provider notified.    Patient should follow up with PCP.

## 2020-03-25 NOTE — Anesthesia Postprocedure Evaluation (Signed)
Anesthesia Post Note  Patient: Academic librarian  Procedure(s) Performed: MINOR IRRIGATION AND DEBRIDEMENT OF WOUND (Left Arm Upper)     Patient location during evaluation: PACU Anesthesia Type: General Level of consciousness: awake and alert Pain management: pain level controlled Vital Signs Assessment: post-procedure vital signs reviewed and stable Respiratory status: spontaneous breathing, nonlabored ventilation, respiratory function stable and patient connected to nasal cannula oxygen Cardiovascular status: blood pressure returned to baseline and stable Postop Assessment: no apparent nausea or vomiting Anesthetic complications: no    Last Vitals:  Vitals:   03/24/20 0626 03/24/20 1348  BP: (!) 134/92 118/61  Pulse: 70 90  Resp: 16 16  Temp: 36.6 C 36.8 C  SpO2: 100% 99%    Last Pain:  Vitals:   03/24/20 1348  TempSrc: Oral  PainSc:                  CHRISTOPHER MOSER

## 2020-03-28 LAB — CULTURE, BLOOD (ROUTINE X 2)
Culture: NO GROWTH
Culture: NO GROWTH
Special Requests: ADEQUATE
Special Requests: ADEQUATE

## 2020-03-28 LAB — AEROBIC/ANAEROBIC CULTURE W GRAM STAIN (SURGICAL/DEEP WOUND): Gram Stain: NONE SEEN

## 2022-03-22 IMAGING — CT CT EXTREM UP ENTIRE ARM*L* W/ CM
3 of 8 series · 12 of 34 positions shown, 14 images · IV contrast (OMNIPAQUE 300)
Comparison: X-ray 03/23/2020

CLINICAL DATA: Left arm infection. IV drug user.

EXAM:
CT OF THE UPPER LEFT EXTREMITY WITH CONTRAST
TECHNIQUE: Multidetector CT imaging of the left upper extremity was performed
according to the standard protocol following intravenous contrast
administration.
CONTRAST:  100mL OMNIPAQUE IOHEXOL 300 MG/ML  SOLN

[Series 3: axial st · axial · 0.62mm/px · z∈[-217,+7]mm · 4 of 188 slices shown, 5 images (1 of 2)]
[im 38/188  soft-tissue]
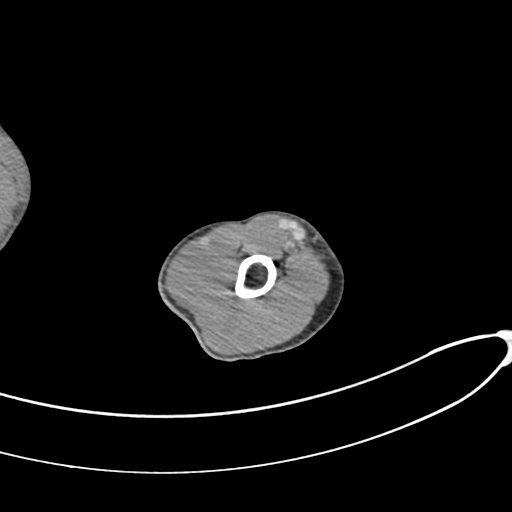
[im 38/188  bone]
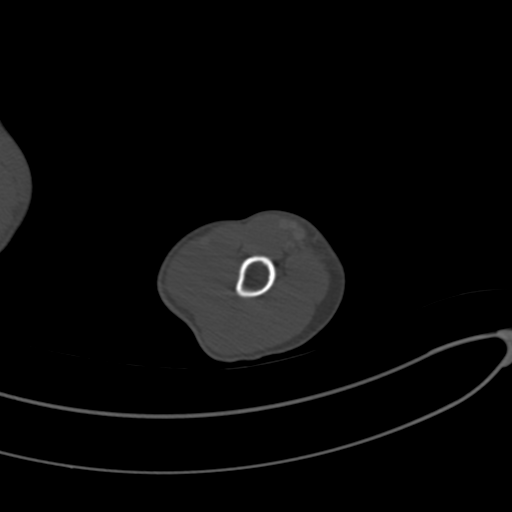
[im 75/188  bone]
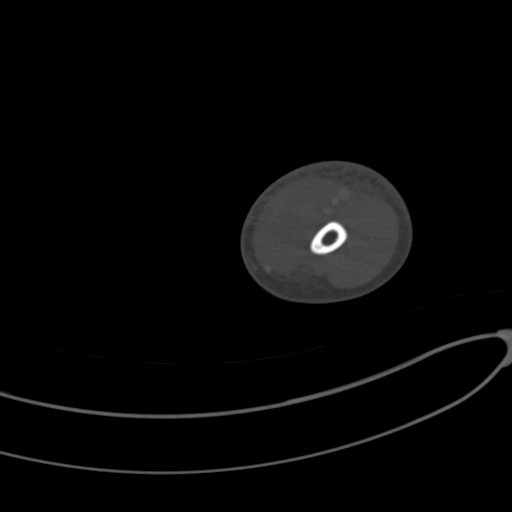
[im 113/188  bone]
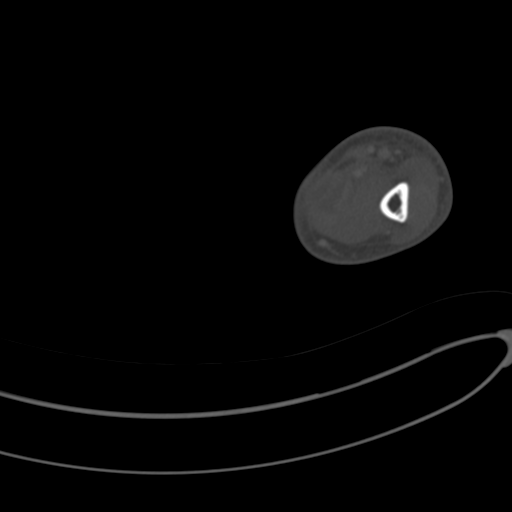
[im 150/188  bone]
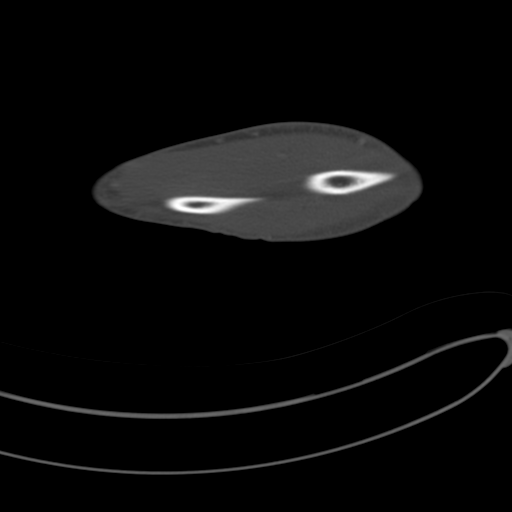

[Series 7: axial st humerus · axial · 0.39mm/px · z∈[-212,-52]mm · 3 of 161 slices shown]
[im 41/161  bone]
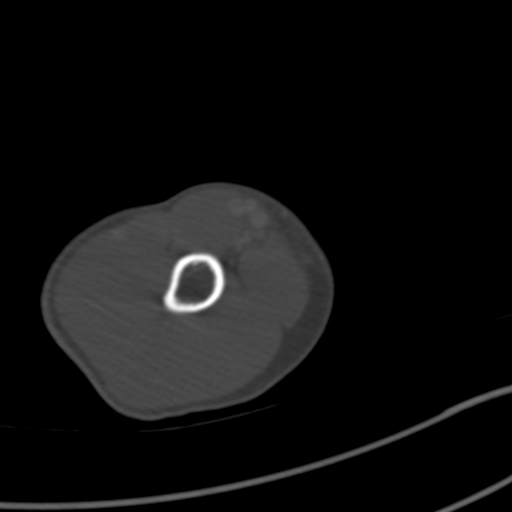
[im 81/161  bone]
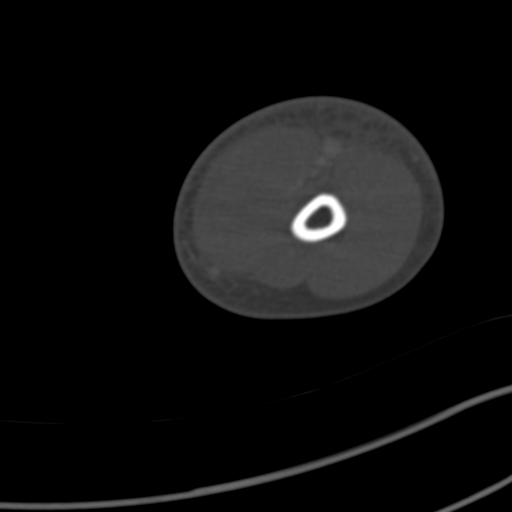
[im 121/161  bone]
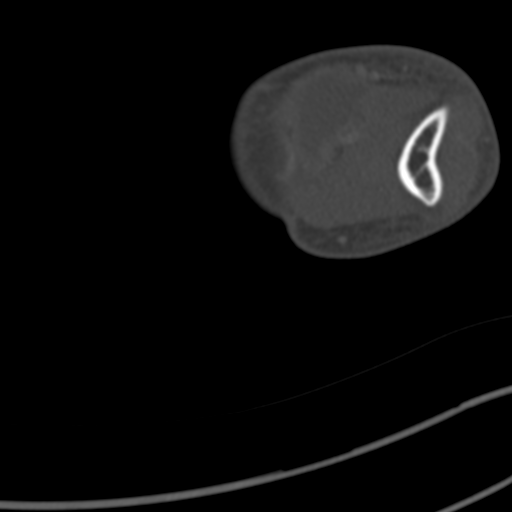

[Series 608: axial st · sagittal · 0.60mm/px · 5 of 148 slices shown, 6 images (2 of 2)]
[im 56/148  bone]
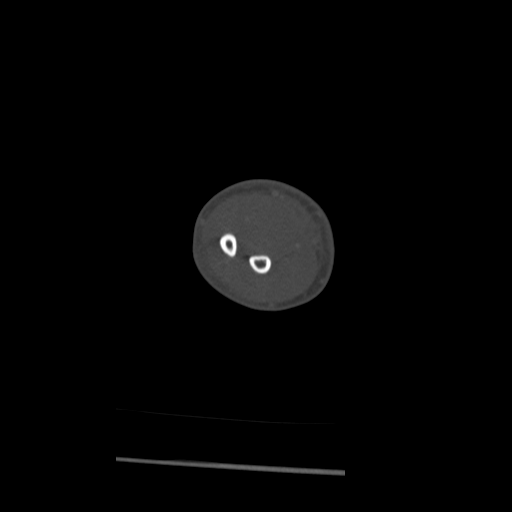
[im 65/148  bone]
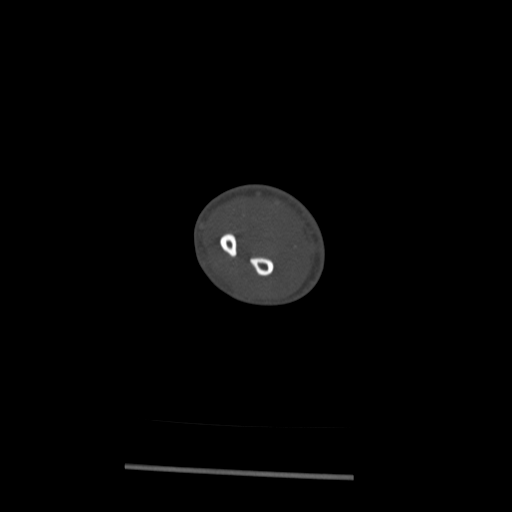
[im 74/148  soft-tissue]
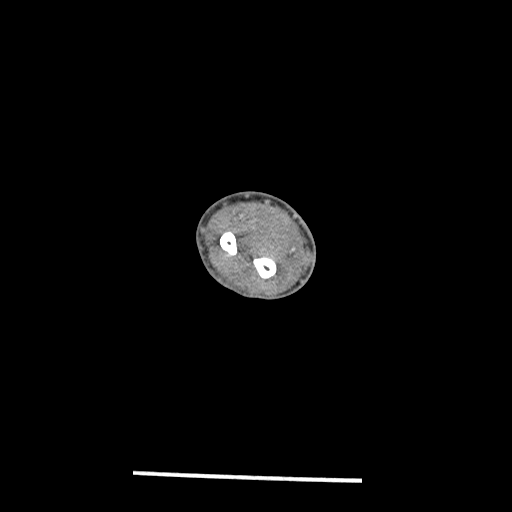
[im 74/148  bone]
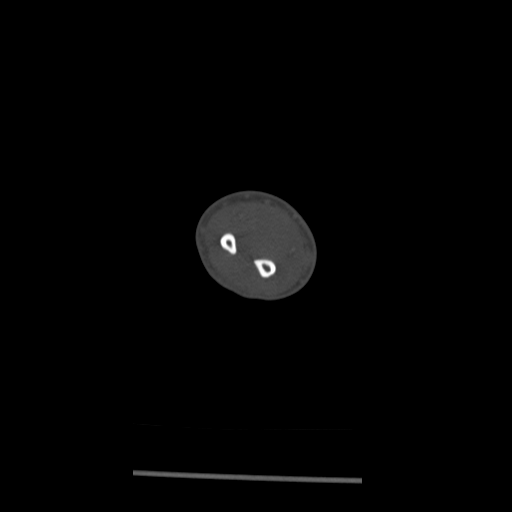
[im 83/148  bone]
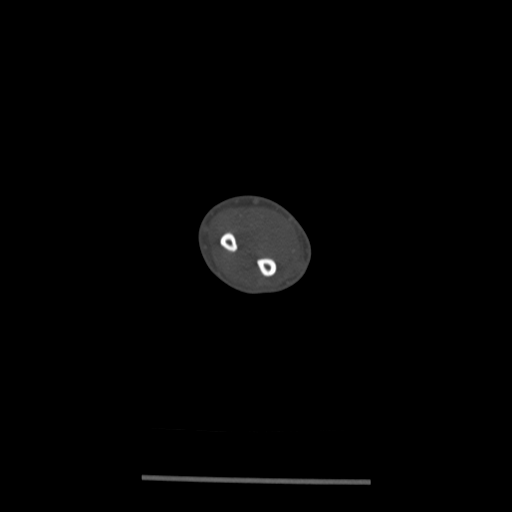
[im 92/148  bone]
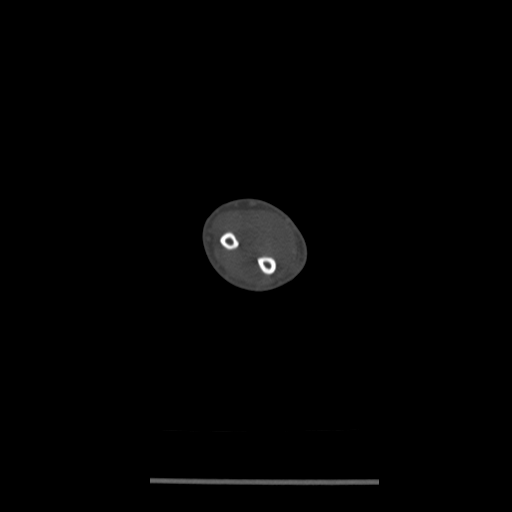

[12 of 34 positions shown; findings below may reference images not displayed]

FINDINGS: Bones/Joint/Cartilage

No acute fracture. No dislocation. No significant arthropathy. No
cortical destruction or periostitis. No elbow joint effusion.

Ligaments

Suboptimally assessed by CT.

Muscles and Tendons

There is a large complex rim enhancing intramuscular fluid
collection extending from the level of the mid biceps through the
antecubital fossa and into the proximal flexor muscle compartment of
the left forearm. Collection does appear contiguous with some
lobulated and possibly loculated components. There is no gas evident
within the collection. The component within the mid to distal biceps
muscle measures approximately 4.8 x 2.7 x 7.5 cm (series 3, image
84; series 602, image 30).

The intramuscular collection which extends into the proximal forearm
appears primarily located within the brachial radialis muscle.
Forearm component measures approximately 5.3 x 2.9 x 6.9 cm (series
3, image 64; series 602, image 15).

Tendinous structures appear grossly intact.

Soft tissues

There is overlying skin thickening and induration within the
subcutaneous fat compatible with cellulitis. No soft tissue gas. No
radiopaque soft tissue foreign body. The deep venous structures of
the upper extremity appear appropriately opacified.
IMPRESSION: 1. Large complex rim enhancing intramuscular fluid collection
extending from the level of the mid biceps through the antecubital
fossa and into the proximal flexor muscle compartment of the left
forearm. Collection does appear contiguous with some lobulated and
possibly loculated components. There is no gas evident within the
collection. Findings are compatible with abscess. The component
within the biceps measures 4.8 x 2.7 x 7.5 cm (volume = 51 cm^3).
The forearm component measures 5.3 x 2.9 x 6.9 cm (volume = 56
cm^3).
2. No elbow joint effusion to suggest septic arthritis. No evidence
of osteomyelitis.

## 2022-11-18 ENCOUNTER — Other Ambulatory Visit: Payer: Self-pay

## 2022-11-18 ENCOUNTER — Encounter (HOSPITAL_COMMUNITY): Payer: Self-pay | Admitting: Emergency Medicine

## 2022-11-18 ENCOUNTER — Inpatient Hospital Stay (HOSPITAL_COMMUNITY)
Admission: EM | Admit: 2022-11-18 | Discharge: 2022-12-02 | DRG: 871 | Discharge status: Against Medical Advice | Payer: Medicaid Other | Attending: Internal Medicine | Admitting: Internal Medicine

## 2022-11-18 ENCOUNTER — Emergency Department (HOSPITAL_COMMUNITY): Payer: Medicaid Other

## 2022-11-18 DIAGNOSIS — F1910 Other psychoactive substance abuse, uncomplicated: Secondary | ICD-10-CM | POA: Diagnosis not present

## 2022-11-18 DIAGNOSIS — Z1152 Encounter for screening for COVID-19: Secondary | ICD-10-CM

## 2022-11-18 DIAGNOSIS — B9562 Methicillin resistant Staphylococcus aureus infection as the cause of diseases classified elsewhere: Secondary | ICD-10-CM | POA: Diagnosis not present

## 2022-11-18 DIAGNOSIS — R000 Tachycardia, unspecified: Secondary | ICD-10-CM | POA: Insufficient documentation

## 2022-11-18 DIAGNOSIS — B182 Chronic viral hepatitis C: Secondary | ICD-10-CM | POA: Diagnosis present

## 2022-11-18 DIAGNOSIS — Z888 Allergy status to other drugs, medicaments and biological substances status: Secondary | ICD-10-CM | POA: Diagnosis not present

## 2022-11-18 DIAGNOSIS — I76 Septic arterial embolism: Secondary | ICD-10-CM | POA: Diagnosis present

## 2022-11-18 DIAGNOSIS — F19239 Other psychoactive substance dependence with withdrawal, unspecified: Secondary | ICD-10-CM | POA: Diagnosis not present

## 2022-11-18 DIAGNOSIS — Z5320 Procedure and treatment not carried out because of patient's decision for unspecified reasons: Secondary | ICD-10-CM | POA: Diagnosis present

## 2022-11-18 DIAGNOSIS — A419 Sepsis, unspecified organism: Secondary | ICD-10-CM | POA: Insufficient documentation

## 2022-11-18 DIAGNOSIS — E861 Hypovolemia: Secondary | ICD-10-CM | POA: Diagnosis present

## 2022-11-18 DIAGNOSIS — F17290 Nicotine dependence, other tobacco product, uncomplicated: Secondary | ICD-10-CM | POA: Diagnosis present

## 2022-11-18 DIAGNOSIS — M00012 Staphylococcal arthritis, left shoulder: Secondary | ICD-10-CM | POA: Diagnosis not present

## 2022-11-18 DIAGNOSIS — J189 Pneumonia, unspecified organism: Principal | ICD-10-CM | POA: Diagnosis present

## 2022-11-18 DIAGNOSIS — R7881 Bacteremia: Secondary | ICD-10-CM | POA: Diagnosis present

## 2022-11-18 DIAGNOSIS — K7291 Hepatic failure, unspecified with coma: Secondary | ICD-10-CM | POA: Diagnosis present

## 2022-11-18 DIAGNOSIS — F1110 Opioid abuse, uncomplicated: Secondary | ICD-10-CM | POA: Diagnosis present

## 2022-11-18 DIAGNOSIS — A4102 Sepsis due to Methicillin resistant Staphylococcus aureus: Principal | ICD-10-CM | POA: Diagnosis present

## 2022-11-18 DIAGNOSIS — R011 Cardiac murmur, unspecified: Secondary | ICD-10-CM | POA: Diagnosis not present

## 2022-11-18 DIAGNOSIS — E871 Hypo-osmolality and hyponatremia: Secondary | ICD-10-CM | POA: Diagnosis present

## 2022-11-18 DIAGNOSIS — F17210 Nicotine dependence, cigarettes, uncomplicated: Secondary | ICD-10-CM | POA: Diagnosis present

## 2022-11-18 DIAGNOSIS — M25511 Pain in right shoulder: Secondary | ICD-10-CM | POA: Diagnosis not present

## 2022-11-18 DIAGNOSIS — F1990 Other psychoactive substance use, unspecified, uncomplicated: Secondary | ICD-10-CM | POA: Diagnosis not present

## 2022-11-18 DIAGNOSIS — M60009 Infective myositis, unspecified site: Secondary | ICD-10-CM

## 2022-11-18 DIAGNOSIS — M1990 Unspecified osteoarthritis, unspecified site: Secondary | ICD-10-CM | POA: Diagnosis not present

## 2022-11-18 DIAGNOSIS — R739 Hyperglycemia, unspecified: Secondary | ICD-10-CM | POA: Diagnosis present

## 2022-11-18 DIAGNOSIS — F988 Other specified behavioral and emotional disorders with onset usually occurring in childhood and adolescence: Secondary | ICD-10-CM | POA: Diagnosis present

## 2022-11-18 DIAGNOSIS — Q2112 Patent foramen ovale: Secondary | ICD-10-CM | POA: Diagnosis not present

## 2022-11-18 DIAGNOSIS — Z5329 Procedure and treatment not carried out because of patient's decision for other reasons: Secondary | ICD-10-CM | POA: Diagnosis present

## 2022-11-18 DIAGNOSIS — Z8249 Family history of ischemic heart disease and other diseases of the circulatory system: Secondary | ICD-10-CM

## 2022-11-18 DIAGNOSIS — L03116 Cellulitis of left lower limb: Secondary | ICD-10-CM | POA: Diagnosis not present

## 2022-11-18 DIAGNOSIS — M25552 Pain in left hip: Secondary | ICD-10-CM | POA: Diagnosis not present

## 2022-11-18 DIAGNOSIS — R1032 Left lower quadrant pain: Secondary | ICD-10-CM | POA: Diagnosis not present

## 2022-11-18 DIAGNOSIS — M25512 Pain in left shoulder: Secondary | ICD-10-CM | POA: Diagnosis not present

## 2022-11-18 HISTORY — DX: Other psychoactive substance abuse, uncomplicated: F19.10

## 2022-11-18 LAB — URINALYSIS, ROUTINE W REFLEX MICROSCOPIC
Bilirubin Urine: NEGATIVE
Glucose, UA: NEGATIVE mg/dL
Ketones, ur: NEGATIVE mg/dL
Nitrite: POSITIVE — AB
Protein, ur: NEGATIVE mg/dL
Specific Gravity, Urine: 1.009 (ref 1.005–1.030)
WBC, UA: >50 WBC/hpf — ABNORMAL HIGH (ref 0–5)
pH: 6 (ref 5.0–8.0)

## 2022-11-18 LAB — BASIC METABOLIC PANEL
Anion gap: 10 (ref 5–15)
BUN: 5 mg/dL — ABNORMAL LOW (ref 6–20)
CO2: 23 mmol/L (ref 22–32)
Calcium: 8.6 mg/dL — ABNORMAL LOW (ref 8.9–10.3)
Chloride: 97 mmol/L — ABNORMAL LOW (ref 98–111)
Creatinine, Ser: 0.59 mg/dL (ref 0.44–1.00)
GFR, Estimated: >60 mL/min (ref >60)
Glucose, Bld: 125 mg/dL — ABNORMAL HIGH (ref 70–99)
Potassium: 3.7 mmol/L (ref 3.5–5.1)
Sodium: 130 mmol/L — ABNORMAL LOW (ref 135–145)

## 2022-11-18 LAB — CBC
HCT: 36.4 % (ref 36.0–46.0)
Hemoglobin: 12.6 g/dL (ref 12.0–15.0)
MCH: 30.2 pg (ref 26.0–34.0)
MCHC: 34.6 g/dL (ref 30.0–36.0)
MCV: 87.3 fL (ref 80.0–100.0)
Platelets: 216 10*3/uL (ref 150–400)
RBC: 4.17 MIL/uL (ref 3.87–5.11)
RDW: 13.5 % (ref 11.5–15.5)
WBC: 15.6 10*3/uL — ABNORMAL HIGH (ref 4.0–10.5)
nRBC: 0 % (ref 0.0–0.2)

## 2022-11-18 LAB — RAPID URINE DRUG SCREEN, HOSP PERFORMED
Amphetamines: POSITIVE — AB
Barbiturates: NOT DETECTED
Benzodiazepines: NOT DETECTED
Cocaine: POSITIVE — AB
Opiates: NOT DETECTED
Tetrahydrocannabinol: NOT DETECTED

## 2022-11-18 LAB — LACTIC ACID, PLASMA: Lactic Acid, Venous: 1.4 mmol/L (ref 0.5–1.9)

## 2022-11-18 LAB — RESP PANEL BY RT-PCR (RSV, FLU A&B, COVID)  RVPGX2
Influenza A by PCR: NEGATIVE
Influenza B by PCR: NEGATIVE
Resp Syncytial Virus by PCR: NEGATIVE
SARS Coronavirus 2 by RT PCR: NEGATIVE

## 2022-11-18 LAB — I-STAT BETA HCG BLOOD, ED (MC, WL, AP ONLY): I-stat hCG, quantitative: <5 m[IU]/mL (ref <5)

## 2022-11-18 MED ORDER — VANCOMYCIN HCL 1.25 G IV SOLR
1250.0000 mg | Freq: Once | INTRAVENOUS | Status: AC
  Administered 2022-11-18: 1250 mg via INTRAVENOUS
  Filled 2022-11-18: qty 25

## 2022-11-18 MED ORDER — ACETAMINOPHEN 325 MG PO TABS
650.0000 mg | ORAL_TABLET | Freq: Four times a day (QID) | ORAL | Status: DC | PRN
  Administered 2022-11-19 – 2022-11-26 (×7): 650 mg via ORAL
  Filled 2022-11-18 (×7): qty 2

## 2022-11-18 MED ORDER — HYDROMORPHONE HCL 1 MG/ML IJ SOLN
1.0000 mg | INTRAMUSCULAR | Status: DC | PRN
  Administered 2022-11-18: 1 mg via INTRAVENOUS
  Filled 2022-11-18: qty 1

## 2022-11-18 MED ORDER — METOPROLOL TARTRATE 5 MG/5ML IV SOLN: 2.5000 mg | Freq: Four times a day (QID) | INTRAVENOUS | Status: DC | PRN

## 2022-11-18 MED ORDER — ONDANSETRON HCL 4 MG/2ML IJ SOLN
4.0000 mg | Freq: Four times a day (QID) | INTRAMUSCULAR | Status: DC | PRN
  Filled 2022-11-18 (×2): qty 2

## 2022-11-18 MED ORDER — SODIUM CHLORIDE 0.9 % IV SOLN: INTRAVENOUS | Status: DC

## 2022-11-18 MED ORDER — LACTATED RINGERS IV BOLUS
1000.0000 mL | Freq: Once | INTRAVENOUS | Status: AC
  Administered 2022-11-18: 1000 mL via INTRAVENOUS

## 2022-11-18 MED ORDER — SODIUM CHLORIDE 0.9 % IV SOLN
2.0000 g | Freq: Once | INTRAVENOUS | Status: AC
  Administered 2022-11-18: 2 g via INTRAVENOUS
  Filled 2022-11-18: qty 12.5

## 2022-11-18 MED ORDER — ACETAMINOPHEN 650 MG RE SUPP: 650.0000 mg | Freq: Four times a day (QID) | RECTAL | Status: DC | PRN

## 2022-11-18 MED ORDER — METOPROLOL TARTRATE 5 MG/5ML IV SOLN
5.0000 mg | Freq: Four times a day (QID) | INTRAVENOUS | Status: DC | PRN
  Administered 2022-11-19: 5 mg via INTRAVENOUS
  Filled 2022-11-18 (×2): qty 5

## 2022-11-18 MED ORDER — MORPHINE SULFATE ER 15 MG PO TBCR
30.0000 mg | EXTENDED_RELEASE_TABLET | Freq: Two times a day (BID) | ORAL | Status: DC
  Administered 2022-11-18 – 2022-11-30 (×24): 30 mg via ORAL
  Filled 2022-11-18 (×24): qty 2

## 2022-11-18 MED ORDER — POLYETHYLENE GLYCOL 3350 17 G PO PACK
17.0000 g | PACK | Freq: Two times a day (BID) | ORAL | Status: AC
  Filled 2022-11-18 (×2): qty 1

## 2022-11-18 MED ORDER — HYDROMORPHONE HCL 1 MG/ML IJ SOLN
2.0000 mg | INTRAMUSCULAR | Status: DC | PRN
  Administered 2022-11-18: 2 mg via INTRAVENOUS
  Filled 2022-11-18: qty 2

## 2022-11-18 MED ORDER — ENOXAPARIN SODIUM 40 MG/0.4ML IJ SOSY
40.0000 mg | PREFILLED_SYRINGE | INTRAMUSCULAR | Status: DC
  Administered 2022-11-19 – 2022-11-21 (×3): 40 mg via SUBCUTANEOUS
  Filled 2022-11-18 (×11): qty 0.4

## 2022-11-18 MED ORDER — POLYETHYLENE GLYCOL 3350 17 G PO PACK: 17.0000 g | PACK | Freq: Every day | ORAL | Status: DC | PRN

## 2022-11-18 MED ORDER — ONDANSETRON HCL 4 MG PO TABS: 4.0000 mg | ORAL_TABLET | Freq: Four times a day (QID) | ORAL | Status: DC | PRN

## 2022-11-18 MED ORDER — HYDROMORPHONE HCL 1 MG/ML IJ SOLN
2.0000 mg | INTRAMUSCULAR | Status: AC | PRN
  Administered 2022-11-18 – 2022-11-19 (×4): 2 mg via INTRAVENOUS
  Filled 2022-11-18 (×4): qty 2

## 2022-11-18 MED ORDER — VANCOMYCIN HCL IN DEXTROSE 750-5 MG/150ML-% IV SOLN
750.0000 mg | Freq: Two times a day (BID) | INTRAVENOUS | Status: DC
  Filled 2022-11-18 (×2): qty 150

## 2022-11-18 NOTE — Progress Notes (Signed)
With order to transfer to progressive cardiovascular/thoracic.  Rapid response RN Wilburn Cornelia made aware.  Patient not in distress at this time.

## 2022-11-18 NOTE — ED Provider Notes (Signed)
[facility] EMERGENCY DEPARTMENT Provider Note   CSN: [id number] Arrival date & time: 11/18/22  0759     History  Chief Complaint  Patient presents with   Shortness of Breath   Weakness   drug withdrawal    Cassandra Roberts is a 32 y.o. female.  HPI Patient developed chest pain and cough with shortness of breath and coughing up blood around Christmas time.  She went to [facility] and got admitted 12\23 for multifocal pneumonia.  She was started on broad-spectrum antibiotics but signed out [facility] on 12\24.  Patient reports since that time symptoms have gotten bad again.  She has a lot of chest discomfort with a heavy feeling in her chest.  She feels short of breath.  She reports she feels achy and weak in all of her extremities.  She reports she has continued to use drugs.  She reports she has injected fentanyl most recently.    Home Medications Prior to Admission medications   Medication Sig Start Date End Date Taking? Authorizing Provider  acetaminophen (TYLENOL) 325 MG tablet Take 325 mg by mouth every 6 (six) hours as needed for fever.   Yes [provider]  ibuprofen (ADVIL) 200 MG tablet Take 200 mg by mouth every 6 (six) hours as needed for moderate pain.   Yes [provider]  atomoxetine (STRATTERA) 40 MG capsule Take 1 capsule (40 mg total) by mouth daily. After 3 days use 2 tabs a day at HS. After 2 weeks change to 100mg  tabs Patient not taking: Reported on 06/15/2015 09/04/14   Leandrew Koyanagi, MD  benzonatate (TESSALON) 100 MG capsule Take 1 capsule (100 mg total) by mouth every 8 (eight) hours. Patient not taking: Reported on 03/23/2020 07/02/15   Debby Freiberg, MD  buPROPion (WELLBUTRIN XL) 150 MG 24 hr tablet TAKE 1 TABLET BY MOUTH DAILY FOR 1 WEEK, THEN INCREASE TO 2 TABLETS EACH MORNING Patient not taking: No sig reported 03/18/15   Leandrew Koyanagi, MD  doxycycline (VIBRAMYCIN) 100 MG capsule Take  1 capsule (100 mg total) by mouth 2 (two) times daily. One po bid x 7 days Patient not taking: Reported on 03/23/2020 12/28/19   Sherwood Gambler, MD  ibuprofen (ADVIL,MOTRIN) 800 MG tablet Take 1 tablet (800 mg total) by mouth 3 (three) times daily. Patient not taking: Reported on 06/15/2015 10/22/13   Montine Circle, PA-C  metroNIDAZOLE (FLAGYL) 500 MG tablet Take 1 tablet (500 mg total) by mouth 2 (two) times daily. One po bid x 7 days Patient not taking: Reported on 03/23/2020 07/02/15   Debby Freiberg, MD  naloxone [facility]) 1 MG/ML injection Inject 0.4 mLs (0.4 mg total) into the vein as needed. Patient not taking: Reported on 03/23/2020 06/15/15   Palumbo, April, MD      Allergies    Dextromethorphan    Review of Systems   Review of Systems  Physical Exam Updated Vital Signs BP 130/85   Pulse (!) 114   Temp 98.5 F (36.9 C) (Oral)   Resp (!) 27   Ht 5\' 2"  (1.575 m)   Wt 56.7 kg   LMP 11/04/2022   SpO2 99%   BMI 22.86 kg/m  Physical Exam Constitutional:      Comments: Patient is alert with clear mental status.  She does not have respiratory distress at rest.  She does appear very uncomfortable and is tearful.  HENT:     Mouth/Throat:     Pharynx:  Oropharynx is clear.  Eyes:     Extraocular Movements: Extraocular movements intact.  Cardiovascular:     Comments: Tachycardia.  I cannot appreciate rub murmur gallop. Pulmonary:     Comments: Sounds decreased at the bases.  No overt wheeze or significant crackle. Abdominal:     Comments: Abdomen is soft and nondistended.  Mildly tender diffusely.  Musculoskeletal:     Comments: No peripheral edema.  Calves soft and nontender.  Patient has track marks but no appearance at this time of abscess or cellulitis on the extremities.  Skin:    General: Skin is warm and dry.  Neurological:     General: No focal deficit present.     Mental Status: She is oriented to person, place, and time.     Motor: No weakness.     Coordination:  Coordination normal.  Psychiatric:     Comments: Patient is anxious and tearful.     ED Results / Procedures / Treatments   Labs (all labs ordered are listed, but only abnormal results are displayed) Labs Reviewed  BASIC METABOLIC PANEL - Abnormal; Notable for the following components:      Result Value   Sodium 130 (*)    Chloride 97 (*)    Glucose, Bld 125 (*)    BUN 5 (*)    Calcium 8.6 (*)    All other components within normal limits  CBC - Abnormal; Notable for the following components:   WBC 15.6 (*)    All other components within normal limits  URINALYSIS, ROUTINE W REFLEX MICROSCOPIC - Abnormal; Notable for the following components:   APPearance CLOUDY (*)    Hgb urine dipstick SMALL (*)    Nitrite POSITIVE (*)    Leukocytes,Ua MODERATE (*)    WBC, UA >50 (*)    Bacteria, UA MANY (*)    All other components within normal limits  RAPID URINE DRUG SCREEN, HOSP PERFORMED - Abnormal; Notable for the following components:   Cocaine POSITIVE (*)    Amphetamines POSITIVE (*)    All other components within normal limits  RESP PANEL BY RT-PCR (RSV, FLU A&B, COVID)  RVPGX2  CULTURE, BLOOD (ROUTINE X 2)  CULTURE, BLOOD (ROUTINE X 2)  LACTIC ACID, PLASMA  CBG MONITORING, ED  I-STAT BETA HCG BLOOD, ED ([facility], [facility], [facility] ONLY)    EKG EKG Interpretation  Date/Time:  Thursday November 18 2022 07:59:46 EST Ventricular Rate:  129 PR Interval:  126 QRS Duration: 94 QT Interval:  292 QTC Calculation: 427 R Axis:   43 Text Interpretation: Sinus tachycardia Possible Left atrial enlargement Incomplete right bundle branch block Borderline ECG When compared with ECG of 23-Mar-2020 12:02, PREVIOUS ECG IS PRESENT tachycardia but no sig change from previosu Confirmed by Charlesetta Shanks [phone]) on 11/18/2022 1:22:17 PM  Radiology CT Chest Wo Contrast  Result Date: 11/18/2022 CLINICAL DATA:  Pneumonia.  History of IV drug use. EXAM: CT CHEST WITHOUT CONTRAST TECHNIQUE: Multidetector CT  imaging of the chest was performed following the standard protocol without IV contrast. RADIATION DOSE REDUCTION: This exam was performed according to the departmental dose-optimization program which includes automated exposure control, adjustment of the mA and/or kV according to patient size and/or use of iterative reconstruction technique. COMPARISON:  Chest CTA 10/30/2022 FINDINGS: Cardiovascular: Normal caliber of the thoracic aorta. Normal heart size. No pericardial effusion. Mediastinum/Nodes: No enlarged axillary lymph nodes. Limited assessment for mediastinal and hilar lymphadenopathy due to the absence of IV contrast and paucity of mediastinal fat.  Unremarkable included thyroid. Nondistended esophagus. Lungs/Pleura: New small left and trace right pleural effusions. No pneumothorax. Much of the patchy ground-glass and consolidative opacity on the prior CT has resolved, however there is new more extensive consolidation in the left lower lobe, inferior lingula, and anteromedial left upper lobe. There also multiple new small peripheral nodular opacities in the upper lobes, right middle lobe, and right lower lobe, some of which demonstrate surrounding ground-glass density and with at least one demonstrating a small focus of central cavitation (series 6, image 48 in the right upper lobe). Upper Abdomen: No acute abnormality. Musculoskeletal: No acute osseous abnormality or suspicious osseous lesion. IMPRESSION: 1. Multifocal lung consolidation consistent with pneumonia, greatest in the left lower lobe and left upper lobe/lingula. Bilateral peripheral nodules (with at least 1 being cavitary) raise the possibility of septic emboli. 2. New small left and trace right pleural effusions. Electronically Signed   By: Sebastian Ache M.D.   On: 11/18/2022 14:33   DG Chest 2 View  Result Date: 11/18/2022 CLINICAL DATA:  Shortness of breath. EXAM: CHEST - 2 VIEW COMPARISON:  October 30, 2022. FINDINGS: Stable  cardiomediastinal silhouette. Left basilar atelectasis or pneumonia is noted with associated pleural effusion. Minimal right basilar subsegmental atelectasis or infiltrate is noted. Bony thorax is unremarkable. IMPRESSION: Left basilar atelectasis or pneumonia is noted with associated pleural effusion. Minimal right basilar subsegmental atelectasis or infiltrate. Electronically Signed   By: Lupita Raider M.D.   On: 11/18/2022 10:15    Procedures Procedures   CRITICAL CARE Performed by: Arby Barrette   Total critical care time: 30 minutes  Critical care time was exclusive of separately billable procedures and treating other patients.  Critical care was necessary to treat or prevent imminent or life-threatening deterioration.  Critical care was time spent personally by me on the following activities: development of treatment plan with patient and/or surrogate as well as nursing, discussions with consultants, evaluation of patient's response to treatment, examination of patient, obtaining history from patient or surrogate, ordering and performing treatments and interventions, ordering and review of laboratory studies, ordering and review of radiographic studies, pulse oximetry and re-evaluation of patient's condition.  Medications Ordered in ED Medications  Vancomycin (VANCOCIN) 1,250 mg in sodium chloride 0.9 % 250 mL IVPB (1,250 mg Intravenous New Bag/Given 11/18/22 1441)  vancomycin (VANCOCIN) IVPB 750 mg/150 ml premix (has no administration in time range)  HYDROmorphone (DILAUDID) injection 1 mg (1 mg Intravenous Given 11/18/22 1556)  0.9 %  sodium chloride infusion (has no administration in time range)  ceFEPIme (MAXIPIME) 2 g in sodium chloride 0.9 % 100 mL IVPB (0 g Intravenous Stopped 11/18/22 1408)  lactated ringers bolus 1,000 mL (1,000 mLs Intravenous New Bag/Given 11/18/22 1335)    ED Course/ Medical Decision Making/ A&P                           Medical Decision Making Amount  and/or Complexity of Data Reviewed Labs: ordered. Radiology: ordered.  Risk Prescription drug management. Decision regarding hospitalization.   Patient presents after a diagnosis of bilateral pneumonia on 12\23\2023.  This is complicated by injection drug use.  Patient is high risk for endocarditis and complicated pneumonia.  Will obtain repeat CT scan.  At this time we will not do contrast for pulmonary embolus as it was done at [facility] and there is no PE present.  Will start broad-spectrum antibiotic for bilateral pneumonia.  Patient does have leukocytosis of  15,000.  She is tachycardic in the low 100s.  Blood pressures are stable without hypotension.  She does not have hypoxia.  She does not have elevated lactic acid.  She does have SIRS criteria but at this time not apparent sepsis.  Broad-spectrum antibiotics have been administered.  I will do fluid resuscitation but at this time I do not feel that she needs 30 cc/kg volume.  Continue observe for any changes.  Patient has been up and ambulatory.  She does appear very uncomfortable.  She is likely also experiencing narcotic withdrawal.  Will administer narcotic pain medication for acute pain with serious underlying condition.  Consult: [facility] hospitalist for admission.        Final Clinical Impression(s) / ED Diagnoses Final diagnoses:  Multifocal pneumonia  Polysubstance abuse (HCC)  Hyponatremia    Rx / DC Orders ED Discharge Orders     None         Arby Barrette, MD 11/18/22 1611

## 2022-11-18 NOTE — ED Notes (Signed)
Attempted IV start patient has track marks all over arms due to IV drug use.  Last use last night.

## 2022-11-18 NOTE — ED Notes (Signed)
Patient difficulty stick unable to obtain 2nd set of cultures.

## 2022-11-18 NOTE — Progress Notes (Signed)
Pharmacy Antibiotic Note  Cassandra Roberts is a 32 y.o. female admitted on 11/18/2022 with pneumonia. Patient is on cefepime 2g IV q8h. Pharmacy has been consulted for vancomycin dosing.  Scr 0.59. Wbc 15.6 and afebrile.   Plan: Start cefepime 2g IV q8h Give vanc loading 1250 mg x1 Start vanc maintenance 750 mg IV q12h  Obtain vancomycin peak and trough levels for goal AUC 400-550   Height: 5\' 2"  (157.5 cm) Weight: 56.7 kg (125 lb) IBW/kg (Calculated) : 50.1  Temp (24hrs), Avg:99.1 F (37.3 C), Min:98.1 F (36.7 C), Max:99.8 F (37.7 C)  Recent Labs  Lab 11/18/22 0828  WBC 15.6*  CREATININE 0.59    Estimated Creatinine Clearance: 80.6 mL/min (by C-G formula based on SCr of 0.59 mg/dL).    Allergies  Allergen Reactions   Dextromethorphan     Shaking    Antimicrobials this admission: cefepime 1/11 >>  vancomycin 1/11 >>   Dose adjustments this admission: N/a  Microbiology results: 1/11 BCx: sent  1/11 respiratory panel: negative    Thank you for allowing pharmacy to be a part of this patient's care.  Gena Fray, PharmD PGY1 Pharmacy Resident   11/18/2022 1:49 PM

## 2022-11-18 NOTE — Progress Notes (Signed)
Called at [facility] for VS obtained at 1759. VS: T 99.63F, BP 145/97, HR 125, RR 28, SpO2 98% on room air. Per RN, rapid response not needed. Rapid response RN recommended rectal temperature in the setting of tachycardia and tachypnea. Per RN, MD had been notified and transfer orders for PCU placed.    Check rectal temperature Continue MEWS protocol VS: q1h x2, then q4h  Kennis Carina

## 2022-11-18 NOTE — ED Notes (Signed)
ED TO INPATIENT HANDOFF REPORT  ED Nurse Name and Phone #: [phone]  S Name/Age/Gender Cassandra Roberts 32 y.o. female Room/Bed: 043C/043C  Code Status   Code Status: Prior  Home/SNF/Other Home Patient oriented to: self, place, time, and situation Is this baseline? Yes   Triage Complete: Triage complete  Chief Complaint CAP (community acquired pneumonia) [J18.9]  Triage Note Pt. Stated, I had pneumonia and Im SOB , weak all over. Im also having Fentanyl withdrawals. Last take of Fentanyl last was 20.    Allergies Allergies  Allergen Reactions   Dextromethorphan     Shaking    Level of Care/Admitting Diagnosis ED Disposition     ED Disposition  Admit   Condition  --   Comment  Hospital Area: [facility] [[id number]]  Level of Care: Telemetry Medical [[id number]]  May admit patient to [facility] or [facility] if equivalent level of care is available:: No  Covid Evaluation: Asymptomatic - no recent exposure (last 10 days) testing not required  Diagnosis: CAP (community acquired pneumonia) [564332]  Admitting Physician: Charlynne Cousins [[id number]]  Attending Physician: Charlynne Cousins [[id number]]  Certification:: I certify this patient will need inpatient services for at least 2 midnights  Estimated Length of Stay: 2          B Medical/Surgery History Past Medical History:  Diagnosis Date   Attention deficit disorder (ADD)    Hepatitis C    Substance abuse ([place])    Past Surgical History:  Procedure Laterality Date   APPENDECTOMY     MINOR IRRIGATION AND DEBRIDEMENT OF WOUND Left 03/23/2020   Procedure: MINOR IRRIGATION AND DEBRIDEMENT OF WOUND;  Surgeon: Mcarthur Rossetti, MD;  Location: [facility];  Service: Orthopedics;  Laterality: Left;     A IV Location/Drains/Wounds Patient Lines/Drains/Airways Status     Active Line/Drains/Airways     Name Placement date Placement time Site Days   Peripheral IV 11/18/22 22 G 1.75" Anterior;Right  Forearm 11/18/22  1257  Forearm  less than 1   Incision (Closed) 03/23/20 Arm 03/23/20  1353  -- 970            Intake/Output Last 24 hours  Intake/Output Summary (Last 24 hours) at 11/18/2022 1615 Last data filed at 11/18/2022 1408 Gross per 24 hour  Intake 100 ml  Output --  Net 100 ml    Labs/Imaging Results for orders placed or performed during the hospital encounter of 11/18/22 (from the past 48 hour(s))  Basic metabolic panel     Status: Abnormal   Collection Time: 11/18/22  8:28 AM  Result Value Ref Range   Sodium 130 (L) 135 - 145 mmol/L   Potassium 3.7 3.5 - 5.1 mmol/L   Chloride 97 (L) 98 - 111 mmol/L   CO2 23 22 - 32 mmol/L   Glucose, Bld 125 (H) 70 - 99 mg/dL    Comment: Glucose reference range applies only to samples taken after fasting for at least 8 hours.   BUN 5 (L) 6 - 20 mg/dL   Creatinine, Ser 0.59 0.44 - 1.00 mg/dL   Calcium 8.6 (L) 8.9 - 10.3 mg/dL   GFR, Estimated >60 >60 mL/min    Comment: (NOTE) Calculated using the CKD-EPI Creatinine Equation (2021)    Anion gap 10 5 - 15    Comment: Performed at [facility] [address] [place], [place] [address]  CBC     Status: Abnormal   Collection Time: 11/18/22  8:28  AM  Result Value Ref Range   WBC 15.6 (H) 4.0 - 10.5 K/uL   RBC 4.17 3.87 - 5.11 MIL/uL   Hemoglobin 12.6 12.0 - 15.0 g/dL   HCT 67.1 24.5 - 80.9 %   MCV 87.3 80.0 - 100.0 fL   MCH 30.2 26.0 - 34.0 pg   MCHC 34.6 30.0 - 36.0 g/dL   RDW 98.3 38.2 - 50.5 %   Platelets 216 150 - 400 K/uL   nRBC 0.0 0.0 - 0.2 %    Comment: Performed at [facility], [address] [place], [place] [address]  I-Stat beta hCG blood, ED     Status: None   Collection Time: 11/18/22  9:18 AM  Result Value Ref Range   I-stat hCG, quantitative <5.0 <5 mIU/mL   Comment 3            Comment:   GEST. AGE      CONC.  (mIU/mL)   <=1 WEEK        5 - 50     2 WEEKS       50 - 500     3 WEEKS       100 - 10,000     4 WEEKS     1,000 - 30,000         FEMALE AND NON-PREGNANT FEMALE:     LESS THAN 5 mIU/mL   Resp panel by RT-PCR (RSV, Flu A&B, Covid) Anterior Nasal Swab     Status: None   Collection Time: 11/18/22 10:08 AM   Specimen: Anterior Nasal Swab  Result Value Ref Range   SARS Coronavirus 2 by RT PCR NEGATIVE NEGATIVE    Comment: (NOTE) SARS-CoV-2 target nucleic acids are NOT DETECTED.  The SARS-CoV-2 RNA is generally detectable in upper respiratory specimens during the acute phase of infection. The lowest concentration of SARS-CoV-2 viral copies this assay can detect is 138 copies/mL. A negative result does not preclude SARS-Cov-2 infection and should not be used as the sole basis for treatment or other patient management decisions. A negative result may occur with  improper specimen collection/handling, submission of specimen other than nasopharyngeal swab, presence of viral mutation(s) within the areas targeted by this assay, and inadequate number of viral copies(<138 copies/mL). A negative result must be combined with clinical observations, patient history, and epidemiological information. The expected result is Negative.  Fact Sheet for Patients:  BloggerCourse.com  Fact Sheet for Healthcare Providers:  SeriousBroker.it  This test is no t yet approved or cleared by the [place] FDA and  has been authorized for detection and/or diagnosis of SARS-CoV-2 by FDA under an Emergency Use Authorization (EUA). This EUA will remain  in effect (meaning this test can be used) for the duration of the COVID-19 declaration under Section 564(b)(1) of the Act, 21 U.S.C.section 360bbb-3(b)(1), unless the authorization is terminated  or revoked sooner.       Influenza A by PCR NEGATIVE NEGATIVE   Influenza B by PCR NEGATIVE NEGATIVE    Comment: (NOTE) The Xpert Xpress SARS-CoV-2/FLU/RSV plus assay is intended as an aid in the diagnosis of influenza from Nasopharyngeal swab  specimens and should not be used as a sole basis for treatment. Nasal washings and aspirates are unacceptable for Xpert Xpress SARS-CoV-2/FLU/RSV testing.  Fact Sheet for Patients: BloggerCourse.com  Fact Sheet for Healthcare Providers: SeriousBroker.it  This test is not yet approved or cleared by the [place] FDA and has been authorized for detection and/or diagnosis  of SARS-CoV-2 by FDA under an Emergency Use Authorization (EUA). This EUA will remain in effect (meaning this test can be used) for the duration of the COVID-19 declaration under Section 564(b)(1) of the Act, 21 U.S.C. section 360bbb-3(b)(1), unless the authorization is terminated or revoked.     Resp Syncytial Virus by PCR NEGATIVE NEGATIVE    Comment: (NOTE) Fact Sheet for Patients: EntrepreneurPulse.com.au  Fact Sheet for Healthcare Providers: IncredibleEmployment.be  This test is not yet approved or cleared by the [place] FDA and has been authorized for detection and/or diagnosis of SARS-CoV-2 by FDA under an Emergency Use Authorization (EUA). This EUA will remain in effect (meaning this test can be used) for the duration of the COVID-19 declaration under Section 564(b)(1) of the Act, 21 U.S.C. section 360bbb-3(b)(1), unless the authorization is terminated or revoked.  Performed at [facility], [place] [address] [place], [place] [address]   Urinalysis, Routine w reflex microscopic     Status: Abnormal   Collection Time: 11/18/22 10:20 AM  Result Value Ref Range   Color, Urine YELLOW YELLOW   APPearance CLOUDY (A) CLEAR   Specific Gravity, Urine 1.009 1.005 - 1.030   pH 6.0 5.0 - 8.0   Glucose, UA NEGATIVE NEGATIVE mg/dL   Hgb urine dipstick SMALL (A) NEGATIVE   Bilirubin Urine NEGATIVE NEGATIVE   Ketones, ur NEGATIVE NEGATIVE mg/dL   Protein, ur NEGATIVE NEGATIVE mg/dL   Nitrite POSITIVE (A) NEGATIVE    Leukocytes,Ua MODERATE (A) NEGATIVE   RBC / HPF 0-5 0 - 5 RBC/hpf   WBC, UA >50 (H) 0 - 5 WBC/hpf   Bacteria, UA MANY (A) NONE SEEN   Squamous Epithelial / HPF 21-50 0 - 5 /HPF    Comment: Performed at [facility], [place] [address] [place], [place] [address]  Rapid urine drug screen (hospital performed)     Status: Abnormal   Collection Time: 11/18/22 10:20 AM  Result Value Ref Range   Opiates NONE DETECTED NONE DETECTED   Cocaine POSITIVE (A) NONE DETECTED   Benzodiazepines NONE DETECTED NONE DETECTED   Amphetamines POSITIVE (A) NONE DETECTED   Tetrahydrocannabinol NONE DETECTED NONE DETECTED   Barbiturates NONE DETECTED NONE DETECTED    Comment: (NOTE) DRUG SCREEN FOR MEDICAL PURPOSES ONLY.  IF CONFIRMATION IS NEEDED FOR ANY PURPOSE, NOTIFY LAB WITHIN 5 DAYS.  LOWEST DETECTABLE LIMITS FOR URINE DRUG SCREEN Drug Class                     Cutoff (ng/mL) Amphetamine and metabolites    1000 Barbiturate and metabolites    200 Benzodiazepine                 200 Opiates and metabolites        300 Cocaine and metabolites        300 THC                            50 Performed at [facility], [place] [address] [place], [place] [address]   Lactic acid, plasma     Status: None   Collection Time: 11/18/22  1:03 PM  Result Value Ref Range   Lactic Acid, Venous 1.4 0.5 - 1.9 mmol/L    Comment: Performed at [facility] [address], [place], [place] [address]   CT Chest Wo Contrast  Result Date: 11/18/2022 CLINICAL DATA:  Pneumonia.  History of  IV drug use. EXAM: CT CHEST WITHOUT CONTRAST TECHNIQUE: Multidetector CT imaging of the chest was performed following the standard protocol without IV contrast. RADIATION DOSE REDUCTION: This exam was performed according to the departmental dose-optimization program which includes automated exposure control, adjustment of the mA and/or kV according to patient size and/or use of iterative reconstruction technique. COMPARISON:   Chest CTA 10/30/2022 FINDINGS: Cardiovascular: Normal caliber of the thoracic aorta. Normal heart size. No pericardial effusion. Mediastinum/Nodes: No enlarged axillary lymph nodes. Limited assessment for mediastinal and hilar lymphadenopathy due to the absence of IV contrast and paucity of mediastinal fat. Unremarkable included thyroid. Nondistended esophagus. Lungs/Pleura: New small left and trace right pleural effusions. No pneumothorax. Much of the patchy ground-glass and consolidative opacity on the prior CT has resolved, however there is new more extensive consolidation in the left lower lobe, inferior lingula, and anteromedial left upper lobe. There also multiple new small peripheral nodular opacities in the upper lobes, right middle lobe, and right lower lobe, some of which demonstrate surrounding ground-glass density and with at least one demonstrating a small focus of central cavitation (series 6, image 48 in the right upper lobe). Upper Abdomen: No acute abnormality. Musculoskeletal: No acute osseous abnormality or suspicious osseous lesion. IMPRESSION: 1. Multifocal lung consolidation consistent with pneumonia, greatest in the left lower lobe and left upper lobe/lingula. Bilateral peripheral nodules (with at least 1 being cavitary) raise the possibility of septic emboli. 2. New small left and trace right pleural effusions. Electronically Signed   By: Sebastian Ache M.D.   On: 11/18/2022 14:33   DG Chest 2 View  Result Date: 11/18/2022 CLINICAL DATA:  Shortness of breath. EXAM: CHEST - 2 VIEW COMPARISON:  October 30, 2022. FINDINGS: Stable cardiomediastinal silhouette. Left basilar atelectasis or pneumonia is noted with associated pleural effusion. Minimal right basilar subsegmental atelectasis or infiltrate is noted. Bony thorax is unremarkable. IMPRESSION: Left basilar atelectasis or pneumonia is noted with associated pleural effusion. Minimal right basilar subsegmental atelectasis or infiltrate.  Electronically Signed   By: Lupita Raider M.D.   On: 11/18/2022 10:15    Pending Labs Unresulted Labs (From admission, onward)     Start     Ordered   11/18/22 1207  Blood culture (routine x 2)  BLOOD CULTURE X 2,   R (with STAT occurrences)      11/18/22 1207   Pending  Drug Screen 10 W/Conf, Serum  Once,   R        Pending            Vitals/Pain Today's Vitals   11/18/22 1500 11/18/22 1554 11/18/22 1600 11/18/22 1610  BP: 130/85  136/83   Pulse: (!) 114  (!) 123   Resp: (!) 27  (!) 25   Temp:    99.1 F (37.3 C)  TempSrc:    Oral  SpO2: 99%  98%   Weight:      Height:      PainSc:  10-Worst pain ever      Isolation Precautions Airborne and Contact precautions  Medications Medications  vancomycin (VANCOCIN) IVPB 750 mg/150 ml premix (has no administration in time range)  HYDROmorphone (DILAUDID) injection 1 mg (1 mg Intravenous Given 11/18/22 1556)  0.9 %  sodium chloride infusion (has no administration in time range)  ceFEPIme (MAXIPIME) 2 g in sodium chloride 0.9 % 100 mL IVPB (0 g Intravenous Stopped 11/18/22 1408)  lactated ringers bolus 1,000 mL (1,000 mLs Intravenous New Bag/Given 11/18/22 1335)  Vancomycin (  VANCOCIN) 1,250 mg in sodium chloride 0.9 % 250 mL IVPB (1,250 mg Intravenous New Bag/Given 11/18/22 1441)    Mobility walks Low fall risk   Focused Assessments Cardiac Assessment Handoff:  Cardiac Rhythm: Sinus tachycardia No results found for: "CKTOTAL", "CKMB", "CKMBINDEX", "TROPONINI" No results found for: "DDIMER" Does the Patient currently have chest pain? Yes    R Recommendations: See Admitting Provider Note  Report given to:   Additional Notes: Can ambulate but limited to pain

## 2022-11-18 NOTE — Progress Notes (Signed)
Patient belongings inspected at bedside with [facility] NT.  Other than clothing, toiletry, make up kit, black folder, and pens, found a bag of capsules labeled red hulu kapuas kratom.  This was taken out of the room and kept in the pyxis in the pt's bin.  Patient is aware.

## 2022-11-18 NOTE — Progress Notes (Signed)
   11/18/22 1759  Assess: MEWS Score  Temp 99.9 F (37.7 C)  BP (!) 145/97  MAP (mmHg) 109  Pulse Rate (!) 127  ECG Heart Rate (!) 125  Resp (!) 28  Level of Consciousness Alert  SpO2 98 %  O2 Device Room Air  Assess: MEWS Score  MEWS Temp 0  MEWS Systolic 0  MEWS Pulse 2  MEWS RR 2  MEWS LOC 0  MEWS Score 4  MEWS Score Color Red  Assess: if the MEWS score is Yellow or Red  Were vital signs taken at a resting state? Yes  Focused Assessment No change from prior assessment  Does the patient meet 2 or more of the SIRS criteria? Yes  Does the patient have a confirmed or suspected source of infection? Yes  Provider and Rapid Response Notified? Yes (Dr. Olevia Bowens notified.  Rapid response not notified at this point, pt not in distress.  Will monitor accordingly.)  MEWS guidelines implemented *See Row Information* Yes  Treat  MEWS Interventions Administered scheduled meds/treatments  Pain Scale 0-10  Pain Score 9  Pain Type Acute pain  Pain Location Generalized  Complains of Other (Comment) (pain)  Interventions Medication (see MAR)  Take Vital Signs  Increase Vital Sign Frequency  Red: Q 1hr X 4 then Q 4hr X 4, if remains red, continue Q 4hrs  Escalate  MEWS: Escalate Red: discuss with charge nurse/RN and provider, consider discussing with RRT  Notify: Charge Nurse/RN  Name of Charge Nurse/RN Notified Tammi Klippel RN  Date Charge Nurse/RN Notified 11/18/22  Time Charge Nurse/RN Notified 1800  Provider Notification  Provider Name/Title Dr Aileen Fass  Date Provider Notified 11/18/22  Time Provider Notified 1825  Method of Notification Face-to-face  Notification Reason Other (Comment) (red MEWS)  Provider response See new orders  Date of Provider Response 11/18/22  Time of Provider Response 1825  Notify: Rapid Response  Name of Rapid Response RN Notified Not applicable at this time.  Document  Patient Outcome Stabilized after interventions  Progress note created (see  row info) Yes  Assess: SIRS CRITERIA  SIRS Temperature  0  SIRS Pulse 1  SIRS Respirations  1  SIRS WBC 1  SIRS Score Sum  3

## 2022-11-18 NOTE — Brief Op Note (Signed)
Patient received from ED. Oriented and drowsy. Room air. Patients vitals signs elevated including pulse rate and respirations. Patient complained of pain upon arrival to unit, PRN pain meds given, no changes in elevated pulse rate and respirations despite pain meds given and patient currently sleeping.  Patients MEWS score slighty downtrended from 5 but still at 4. Charge nurse and provider notified of patient status not progressing, after discussion with provider, transfer orders put in for progressive unit. Plan of care continues.

## 2022-11-18 NOTE — ED Triage Notes (Signed)
Pt. Stated, I had pneumonia and Im SOB , weak all over. Im also having Fentanyl withdrawals. Last take of Fentanyl last was 20.

## 2022-11-18 NOTE — H&P (Addendum)
History and Physical  Cassandra Roberts FTD:[id number] DOB: [birth year] DOA: 11/18/2022  PCP: Patient, No Pcp Per Patient coming from: home  I have personally briefly reviewed patient's old medical records in [place]   Chief Complaint: Chest tightness shortness of breath  HPI: Cassandra Roberts is a 32 y.o. 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  She relates her last time she injected IV fentanyl was 24 hours prior to admission.  In the ED: She was found to be afebrile tachycardic with a leukocytosis, blood cultures were sent, urine culture showed possibility of infection, she is hyponatremic SARS-CoV-2 and influenza PCR negative CT of the chest showed multifocal pneumonia with bilateral nodules 1 being cavitary and a small left-sided pleural effusion   Review of Systems: All systems reviewed and apart from history of presenting illness, are negative.  Past Medical History:  Diagnosis Date   Attention deficit disorder (ADD)    Hepatitis C    Substance abuse ([place])    Past Surgical History:  Procedure Laterality Date   APPENDECTOMY     MINOR IRRIGATION AND DEBRIDEMENT OF WOUND Left 03/23/2020   Procedure: MINOR IRRIGATION AND DEBRIDEMENT OF WOUND;  Surgeon: Mcarthur Rossetti, MD;  Location: [facility];  Service: Orthopedics;  Laterality: Left;   Social History:  reports  that she has been smoking cigarettes. She has a 0.50 pack-year smoking history. She has never used smokeless tobacco. She reports that she does not currently use alcohol. She reports current drug use. Frequency: 1.00 time per week. Drugs: Marijuana, Cocaine, Amphetamines, and Fentanyl.   Allergies  Allergen Reactions   Dextromethorphan     Shaking    Family History  Problem Relation Age of Onset   Heart attack Mother       Prior to Admission medications   Medication Sig Start Date End Date Taking? Authorizing Provider  acetaminophen (TYLENOL) 325 MG tablet Take 325 mg by mouth every 6 (six) hours as needed for fever.   Yes [provider]  ibuprofen (ADVIL) 200 MG tablet Take 200 mg by mouth every 6 (six) hours as needed for moderate pain.   Yes [provider]  atomoxetine (STRATTERA) 40 MG capsule Take 1 capsule (40 mg total) by mouth daily. After 3 days use 2 tabs a day at HS. After 2 weeks change to 100mg  tabs Patient not taking: Reported on 06/15/2015 09/04/14   Leandrew Koyanagi, MD  benzonatate (TESSALON) 100 MG capsule Take 1 capsule (100 mg total) by mouth every 8 (eight) hours. Patient not taking: Reported on 03/23/2020 07/02/15   Debby Freiberg, MD  buPROPion (WELLBUTRIN XL) 150 MG 24 hr tablet TAKE 1 TABLET BY MOUTH DAILY FOR 1 WEEK, THEN INCREASE TO 2 TABLETS EACH MORNING Patient not taking: No sig reported 03/18/15   Leandrew Koyanagi, MD  doxycycline (VIBRAMYCIN) 100 MG capsule Take 1 capsule (100 mg total) by mouth 2 (two)  times daily. One po bid x 7 days Patient not taking: Reported on 03/23/2020 12/28/19   Sherwood Gambler, MD  ibuprofen (ADVIL,MOTRIN) 800 MG tablet Take 1 tablet (800 mg total) by mouth 3 (three) times daily. Patient not taking: Reported on 06/15/2015 10/22/13   Montine Circle, PA-C  metroNIDAZOLE (FLAGYL) 500 MG tablet Take 1 tablet (500 mg total) by mouth 2 (two) times daily. One po bid x 7 days Patient not taking: Reported on 03/23/2020  07/02/15   Debby Freiberg, MD  naloxone [facility]) 1 MG/ML injection Inject 0.4 mLs (0.4 mg total) into the vein as needed. Patient not taking: Reported on 03/23/2020 06/15/15   Veatrice Kells, MD   Physical Exam: Vitals:   11/18/22 1354 11/18/22 1440 11/18/22 1500 11/18/22 1610  BP:  139/86 130/85   Pulse:  (!) 123 (!) 114   Resp:  (!) 36 (!) 27   Temp: 98.5 F (36.9 C)   99.1 F (37.3 C)  TempSrc: Oral   Oral  SpO2:  99% 99%   Weight:      Height:        General exam: Moderately built and nourished patient, lying comfortably supine on the gurney in no obvious distress. Head, eyes and ENT: Nontraumatic and normocephalic.  Neck: Supple. No JVD, carotid bruit or thyromegaly. Lymphatics: No lymphadenopathy. Respiratory system: Clear to auscultation. No increased work of breathing. Cardiovascular system: Positive S1-2 she is tachycardic I think she has a murmurCentral nervous system: Alert and oriented. No focal neurological deficits. Extremities: Symmetric 5 x 5 power. Peripheral pulses symmetrically felt.  Skin: Multiple skin scabs which are healing.  Her left foot and the dorsal side is erythematous warm and tender to touch no appreciated fluctuations but there is induration. Musculoskeletal system: Negative exam. Psychiatry: Pleasant and cooperative.   Labs on Admission:  Basic Metabolic Panel: Recent Labs  Lab 11/18/22 0828  NA 130*  K 3.7  CL 97*  CO2 23  GLUCOSE 125*  BUN 5*  CREATININE 0.59  CALCIUM 8.6*   Liver Function Tests: No results for input(s): "AST", "ALT", "ALKPHOS", "BILITOT", "PROT", "ALBUMIN" in the last 168 hours. No results for input(s): "LIPASE", "AMYLASE" in the last 168 hours. No results for input(s): "AMMONIA" in the last 168 hours. CBC: Recent Labs  Lab 11/18/22 0828  WBC 15.6*  HGB 12.6  HCT 36.4  MCV 87.3  PLT 216   Cardiac Enzymes: No results for input(s): "CKTOTAL", "CKMB", "CKMBINDEX", "TROPONINI" in the last 168 hours.  BNP (last  3 results) No results for input(s): "PROBNP" in the last 8760 hours. CBG: No results for input(s): "GLUCAP" in the last 168 hours.  Radiological Exams on Admission: CT Chest Wo Contrast  Result Date: 11/18/2022 CLINICAL DATA:  Pneumonia.  History of IV drug use. EXAM: CT CHEST WITHOUT CONTRAST TECHNIQUE: Multidetector CT imaging of the chest was performed following the standard protocol without IV contrast. RADIATION DOSE REDUCTION: This exam was performed according to the departmental dose-optimization program which includes automated exposure control, adjustment of the mA and/or kV according to patient size and/or use of iterative reconstruction technique. COMPARISON:  Chest CTA 10/30/2022 FINDINGS: Cardiovascular: Normal caliber of the thoracic aorta. Normal heart size. No pericardial effusion. Mediastinum/Nodes: No enlarged axillary lymph nodes. Limited assessment for mediastinal and hilar lymphadenopathy due to the absence of IV contrast and paucity of mediastinal fat. Unremarkable included thyroid. Nondistended esophagus. Lungs/Pleura: New small left and trace right pleural effusions. No pneumothorax. Much of the patchy ground-glass and consolidative opacity  on the prior CT has resolved, however there is new more extensive consolidation in the left lower lobe, inferior lingula, and anteromedial left upper lobe. There also multiple new small peripheral nodular opacities in the upper lobes, right middle lobe, and right lower lobe, some of which demonstrate surrounding ground-glass density and with at least one demonstrating a small focus of central cavitation (series 6, image 48 in the right upper lobe). Upper Abdomen: No acute abnormality. Musculoskeletal: No acute osseous abnormality or suspicious osseous lesion. IMPRESSION: 1. Multifocal lung consolidation consistent with pneumonia, greatest in the left lower lobe and left upper lobe/lingula. Bilateral peripheral nodules (with at least 1 being  cavitary) raise the possibility of septic emboli. 2. New small left and trace right pleural effusions. Electronically Signed   By: Sebastian Ache M.D.   On: 11/18/2022 14:33   DG Chest 2 View  Result Date: 11/18/2022 CLINICAL DATA:  Shortness of breath. EXAM: CHEST - 2 VIEW COMPARISON:  October 30, 2022. FINDINGS: Stable cardiomediastinal silhouette. Left basilar atelectasis or pneumonia is noted with associated pleural effusion. Minimal right basilar subsegmental atelectasis or infiltrate is noted. Bony thorax is unremarkable. IMPRESSION: Left basilar atelectasis or pneumonia is noted with associated pleural effusion. Minimal right basilar subsegmental atelectasis or infiltrate. Electronically Signed   By: Lupita Raider M.D.   On: 11/18/2022 10:15    EKG: Independently reviewed.  Sinus tachycardia possible left atrial enlargement.  Assessment/Plan Multi-focal pneumonia/septic emboli/sepsis/sinus tachycardia/left foot cellulitis: I am concerned about right-sided endocarditis, she was started on IV vancomycin and cefepime, which I agree with. Will check a 2D echo she has a diastolic murmur Blood cultures have already been sent, send for urine culture as her urine appears to be infected.  Was previously on doxycycline and Flagyl there is a good chance cultures will remain negative. But the primary source was probably that she was injecting into her skin. Fortunately her pregnancy test is negative.  Polysubstance abuse (HCC)/ IVDU (intravenous drug user)/possible withdrawal: She is significantly tachycardic her last IV drug use was more than 24 hours ago question if she is withdrawing. Will start her on low-dose MS Contin. UDS was positive for cocaine and amphetamines.  Hypovolemic hyponatremia: Started on IV fluids recheck tomorrow morning.  Leukocytosis: Likely due to infectious etiology.  Hyperglycemia: Likely stress margination   DVT Prophylaxis: Lovenox Code Status: Full  Family  Communication: none  Disposition Plan: inpatient   Time spent: 95 min    It is my clinical opinion that admission to INPATIENT is reasonable and necessary in this 32 y.o. female who comes in for possible multifocal pneumonia, septic emboli with a new murmur concern for right-sided endocarditis and a left lower foot cellulitis where she was injecting IV drugs  Given the aforementioned, the predictability of an adverse outcome is felt to be significant. I expect that the patient will require at least 2 midnights in the hospital to treat this condition.  Marinda Elk MD [facility]   11/18/2022, 4:13 PM

## 2022-11-19 ENCOUNTER — Other Ambulatory Visit (HOSPITAL_COMMUNITY): Payer: Self-pay

## 2022-11-19 ENCOUNTER — Inpatient Hospital Stay (HOSPITAL_COMMUNITY): Payer: Medicaid Other

## 2022-11-19 DIAGNOSIS — R011 Cardiac murmur, unspecified: Secondary | ICD-10-CM

## 2022-11-19 DIAGNOSIS — R7881 Bacteremia: Secondary | ICD-10-CM | POA: Diagnosis present

## 2022-11-19 DIAGNOSIS — M00012 Staphylococcal arthritis, left shoulder: Secondary | ICD-10-CM

## 2022-11-19 DIAGNOSIS — F17210 Nicotine dependence, cigarettes, uncomplicated: Secondary | ICD-10-CM

## 2022-11-19 DIAGNOSIS — M60009 Infective myositis, unspecified site: Secondary | ICD-10-CM

## 2022-11-19 DIAGNOSIS — R000 Tachycardia, unspecified: Secondary | ICD-10-CM

## 2022-11-19 DIAGNOSIS — B182 Chronic viral hepatitis C: Secondary | ICD-10-CM

## 2022-11-19 LAB — BLOOD CULTURE ID PANEL (REFLEXED) - BCID2

## 2022-11-19 LAB — CBC
HCT: 31.8 % — ABNORMAL LOW (ref 36.0–46.0)
Hemoglobin: 11.2 g/dL — ABNORMAL LOW (ref 12.0–15.0)
MCH: 30.3 pg (ref 26.0–34.0)
MCHC: 35.2 g/dL (ref 30.0–36.0)
MCV: 85.9 fL (ref 80.0–100.0)
Platelets: 194 10*3/uL (ref 150–400)
RBC: 3.7 MIL/uL — ABNORMAL LOW (ref 3.87–5.11)
RDW: 13.5 % (ref 11.5–15.5)
WBC: 15.6 10*3/uL — ABNORMAL HIGH (ref 4.0–10.5)
nRBC: 0 % (ref 0.0–0.2)

## 2022-11-19 LAB — ECHOCARDIOGRAM COMPLETE
AR max vel: 2.2 cm2
AV Area VTI: 2.09 cm2
AV Area mean vel: 2.14 cm2
AV Mean grad: 6 mmHg
AV Peak grad: 11.2 mmHg
Ao pk vel: 1.67 m/s
Area-P 1/2: 5.31 cm2
Height: 62 in
S' Lateral: 2.6 cm
Weight: 2000 oz

## 2022-11-19 LAB — COMPREHENSIVE METABOLIC PANEL
ALT: 42 U/L (ref 0–44)
AST: 19 U/L (ref 15–41)
Albumin: 2.3 g/dL — ABNORMAL LOW (ref 3.5–5.0)
Alkaline Phosphatase: 162 U/L — ABNORMAL HIGH (ref 38–126)
Anion gap: 9 (ref 5–15)
BUN: 7 mg/dL (ref 6–20)
CO2: 24 mmol/L (ref 22–32)
Calcium: 8 mg/dL — ABNORMAL LOW (ref 8.9–10.3)
Chloride: 101 mmol/L (ref 98–111)
Creatinine, Ser: 0.67 mg/dL (ref 0.44–1.00)
GFR, Estimated: >60 mL/min (ref >60)
Glucose, Bld: 149 mg/dL — ABNORMAL HIGH (ref 70–99)
Potassium: 3.1 mmol/L — ABNORMAL LOW (ref 3.5–5.1)
Sodium: 134 mmol/L — ABNORMAL LOW (ref 135–145)
Total Bilirubin: 0.9 mg/dL (ref 0.3–1.2)
Total Protein: 5.6 g/dL — ABNORMAL LOW (ref 6.5–8.1)

## 2022-11-19 LAB — HIV ANTIBODY (ROUTINE TESTING W REFLEX): HIV Screen 4th Generation wRfx: NONREACTIVE

## 2022-11-19 MED ORDER — HYDROMORPHONE HCL 1 MG/ML IJ SOLN
2.0000 mg | INTRAMUSCULAR | Status: AC | PRN
  Administered 2022-11-19 (×5): 2 mg via INTRAVENOUS
  Filled 2022-11-19 (×5): qty 2

## 2022-11-19 MED ORDER — POTASSIUM CHLORIDE CRYS ER 20 MEQ PO TBCR
40.0000 meq | EXTENDED_RELEASE_TABLET | Freq: Once | ORAL | Status: AC
  Administered 2022-11-19: 40 meq via ORAL
  Filled 2022-11-19: qty 2

## 2022-11-19 MED ORDER — POTASSIUM CHLORIDE CRYS ER 20 MEQ PO TBCR
40.0000 meq | EXTENDED_RELEASE_TABLET | Freq: Two times a day (BID) | ORAL | Status: AC
  Administered 2022-11-19 (×2): 40 meq via ORAL
  Filled 2022-11-19 (×2): qty 2

## 2022-11-19 MED ORDER — VANCOMYCIN HCL 750 MG/150ML IV SOLN
750.0000 mg | Freq: Two times a day (BID) | INTRAVENOUS | Status: DC
  Administered 2022-11-19 – 2022-11-22 (×8): 750 mg via INTRAVENOUS
  Filled 2022-11-19 (×8): qty 150

## 2022-11-19 MED ORDER — SODIUM CHLORIDE 0.9 % IV SOLN: INTRAVENOUS | Status: AC

## 2022-11-19 MED ORDER — METOPROLOL TARTRATE 5 MG/5ML IV SOLN
5.0000 mg | Freq: Four times a day (QID) | INTRAVENOUS | Status: DC | PRN
  Administered 2022-11-19: 5 mg via INTRAVENOUS
  Filled 2022-11-19 (×2): qty 5

## 2022-11-19 MED ORDER — METOPROLOL TARTRATE 25 MG PO TABS
25.0000 mg | ORAL_TABLET | Freq: Two times a day (BID) | ORAL | Status: DC
  Administered 2022-11-19 – 2022-12-02 (×25): 25 mg via ORAL
  Filled 2022-11-19 (×26): qty 1

## 2022-11-19 MED ORDER — HYDROMORPHONE HCL 1 MG/ML IJ SOLN
2.0000 mg | INTRAMUSCULAR | Status: AC | PRN
  Administered 2022-11-20 (×3): 2 mg via INTRAVENOUS
  Filled 2022-11-19 (×3): qty 2

## 2022-11-19 MED ORDER — LINEZOLID 600 MG PO TABS
600.0000 mg | ORAL_TABLET | Freq: Two times a day (BID) | ORAL | 0 refills | Status: DC
  Filled 2022-11-19: qty 82, 30d supply, fill #0

## 2022-11-19 NOTE — TOC Initial Note (Addendum)
Transition of Care [facility]) - Initial/Assessment Note    Patient Details  Name: Cassandra Roberts MRN: [id number] Date of Birth: 11-Jun-1991  [facility]) CM/SW Contact:    Verdell Carmine, RN Phone Number: 11/19/2022, 12:02 PM  Clinical Narrative:                 32 YO patient with MRSA bacteremia, IVDU. ID is consulted. Plan for TEE to assess for valve vegetation. Patient is uninsured and does not have PCP. If she needs IV abx for a length of time, she will have to stay in the hospital to receive these due to active IV drug use. May need MATCH due to uninsured CM will follow for needs, recommendations,and transitions of care.  [place] letter done. Medications sent to [facility] in case patient decides to leave prior to DC. Pharmacy knows [facility] is complete ID [id number] Bin: [id number] RX group: [id number]   Barriers to Discharge: Inadequate or no insurance   Patient Goals and CMS Choice            Expected Discharge Plan and Services       Living arrangements for the past 2 months: Single Family Home                                      Prior Living Arrangements/Services Living arrangements for the past 2 months: Single Family Home   Patient language and need for interpreter reviewed:: Yes        Need for Family Participation in Patient Care: Yes (Comment) Care giver support system in place?: Yes (comment)   Criminal Activity/Legal Involvement Pertinent to Current Situation/Hospitalization: No - Comment as needed  Activities of Daily Living Home Assistive Devices/Equipment: None ADL Screening (condition at time of admission) Patient's cognitive ability adequate to safely complete daily activities?: Yes Is the patient deaf or have difficulty hearing?: No Does the patient have difficulty seeing, even when wearing glasses/contacts?: No Does the patient have difficulty concentrating, remembering, or making decisions?: No Patient able to express need  for assistance with ADLs?: Yes Does the patient have difficulty dressing or bathing?: Yes Independently performs ADLs?: No Communication: Independent Dressing (OT): Needs assistance Is this a change from baseline?: Change from baseline, expected to last <3days Grooming: Needs assistance Is this a change from baseline?: Change from baseline, expected to last <3 days Feeding: Needs assistance Is this a change from baseline?: Change from baseline, expected to last <3 days Bathing: Needs assistance Is this a change from baseline?: Change from baseline, expected to last <3 days Toileting: Needs assistance Is this a change from baseline?: Change from baseline, expected to last <3 days In/Out Bed: Needs assistance Is this a change from baseline?: Change from baseline, expected to last <3 days Walks in Home: Independent Does the patient have difficulty walking or climbing stairs?: No Weakness of Legs: Both Weakness of Arms/Hands: Both  Permission Sought/Granted                  Emotional Assessment       Orientation: : Oriented to Self, Oriented to Place, Oriented to  Time Alcohol / Substance Use: Illicit Drugs Psych Involvement: No (comment)  Admission diagnosis:  Hyponatremia [E87.1] CAP (community acquired pneumonia) [J18.9] Polysubstance abuse ([place]) [F19.10] Multifocal pneumonia [J18.9] Patient Active Problem List   Diagnosis Date Noted   MRSA bacteremia 11/19/2022   CAP (community acquired  pneumonia) 11/18/2022   Septic embolism ([place]) 11/18/2022   Sepsis ([place]) 11/18/2022   Sinus tachycardia 11/18/2022   Cellulitis of left foot 11/18/2022   Multifocal pneumonia 11/18/2022   Abscess of arm, left 03/23/2020   Polysubstance abuse ([place]) 03/23/2020   IVDU (intravenous drug user) 03/23/2020   Tobacco abuse 03/23/2020   History of hepatitis C virus infection 03/23/2020   Depression 08/21/2013   Suicidal ideation 08/21/2013   Mood disorder ([place]) 08/21/2013   ADD  (attention deficit disorder) 12/15/2012   PCP:  Patient, No Pcp Per Pharmacy:   [facility] [place], [place] - [address] AT [place] [address] Phone: [phone] Fax: [phone]  [place] [facility] - [address] ([place]), [place] - [address] [place]) [place] [address] Phone: [phone] Fax: [phone]  [id number] - [place], [place] [address] [place] [address] Phone: [phone] Fax: [phone]  [id number] - [address] [place] [address] Phone: [phone] Fax: [phone]  [facility] [address] [place] [address] Phone: [phone] Fax: [phone]     Social Determinants of Health (SDOH) Social History: SDOH Screenings   Food Insecurity: No Food Insecurity (11/18/2022)  Housing: Low Risk  (11/18/2022)  Transportation Needs: No Transportation Needs (11/18/2022)  Utilities: Not At Risk (11/18/2022)  Tobacco Use: High Risk (11/18/2022)   SDOH Interventions:     Readmission Risk Interventions     No data to display

## 2022-11-19 NOTE — Progress Notes (Signed)
Pt refused MRI x2. States she maybe up for procedure later.

## 2022-11-19 NOTE — Progress Notes (Signed)
PHARMACY - PHYSICIAN COMMUNICATION CRITICAL VALUE ALERT - BLOOD CULTURE IDENTIFICATION (BCID)  Cassandra Roberts is an 32 y.o. female with h/o IVDU who presented to [facility] on 11/18/2022 with a chief complaint of chest pain and shortness of breath and PNA  Assessment:   Blood cultures growing MRSA  Name of physician (or Provider) Contacted:  Dr. Myna Hidalgo  Current antibiotics:  Vancomycin   Changes to prescribed antibiotics recommended:  No changes at this time  Results for orders placed or performed during the hospital encounter of 11/18/22  Blood Culture ID Panel (Reflexed) (Collected: 11/18/2022 12:07 PM)  Result Value Ref Range   Enterococcus faecalis NOT DETECTED NOT DETECTED   Enterococcus Faecium NOT DETECTED NOT DETECTED   Listeria monocytogenes NOT DETECTED NOT DETECTED   Staphylococcus species DETECTED (A) NOT DETECTED   Staphylococcus aureus (BCID) DETECTED (A) NOT DETECTED   Staphylococcus epidermidis NOT DETECTED NOT DETECTED   Staphylococcus lugdunensis NOT DETECTED NOT DETECTED   Streptococcus species NOT DETECTED NOT DETECTED   Streptococcus agalactiae NOT DETECTED NOT DETECTED   Streptococcus pneumoniae NOT DETECTED NOT DETECTED   Streptococcus pyogenes NOT DETECTED NOT DETECTED   A.calcoaceticus-baumannii NOT DETECTED NOT DETECTED   Bacteroides fragilis NOT DETECTED NOT DETECTED   Enterobacterales NOT DETECTED NOT DETECTED   Enterobacter cloacae complex NOT DETECTED NOT DETECTED   Escherichia coli NOT DETECTED NOT DETECTED   Klebsiella aerogenes NOT DETECTED NOT DETECTED   Klebsiella oxytoca NOT DETECTED NOT DETECTED   Klebsiella pneumoniae NOT DETECTED NOT DETECTED   Proteus species NOT DETECTED NOT DETECTED   Salmonella species NOT DETECTED NOT DETECTED   Serratia marcescens NOT DETECTED NOT DETECTED   Haemophilus influenzae NOT DETECTED NOT DETECTED   Neisseria meningitidis NOT DETECTED NOT DETECTED   Pseudomonas aeruginosa NOT DETECTED NOT DETECTED    Stenotrophomonas maltophilia NOT DETECTED NOT DETECTED   Candida albicans NOT DETECTED NOT DETECTED   Candida auris NOT DETECTED NOT DETECTED   Candida glabrata NOT DETECTED NOT DETECTED   Candida krusei NOT DETECTED NOT DETECTED   Candida parapsilosis NOT DETECTED NOT DETECTED   Candida tropicalis NOT DETECTED NOT DETECTED   Cryptococcus neoformans/gattii NOT DETECTED NOT DETECTED   Meth resistant mecA/C and MREJ DETECTED (A) NOT DETECTED    Caryl Pina 11/19/2022  3:42 AM

## 2022-11-19 NOTE — TOC Transition Note (Signed)
Discharge medications (1) are being stored in the main pharmacy on the ground floor until patient is ready for discharge.   

## 2022-11-19 NOTE — Progress Notes (Signed)
Echocardiogram 2D Echocardiogram has been performed.  Ronny Flurry 11/19/2022, 8:59 AM

## 2022-11-19 NOTE — Consult Note (Addendum)
[facility]    Date of Admission:  11/18/2022     Total days of antibiotics 2               Reason for Consult: MRSA Bacteremia    Referring Provider: Tivis Ringer / [facility]  Primary Care Provider: Patient, No Pcp Per   ASSESSMENT:  Cassandra Roberts is a 32 y/o female with continued polysubstance use admitted with shortness of breath, weakness and concern for fentanyl with drawl found to have MRSA bacteremia and septic emboli leading increased to increased concern for right sided endocarditis. Echocardiogram ordered and will likely need TEE. Recheck blood cultures for clearance of bacteremia. MRI left shoulder and left thigh with concern for septic joint/abscess secondary to increased pain.  Continue current dose of vancomycin and therapeutic drug monitoring of renal function and vancomycin levels. Discontinue cefepime. History Remaining medical and supportive care per Primary Team.   PLAN:  Continue vancomycin. Therapeutic monitoring of renal function and vancomycin levels. Discontinue cefepime.  Contact precautions for MRSA. MRI left shoulder and left thigh when able.   Repeat blood cultures in the morning.  Await TTE results likely to need TEE.  Opioid use disorder and remaining medical and supportive care per Primary Team.    Principal Problem:   CAP (community acquired pneumonia) Active Problems:   Polysubstance abuse (HCC)   IVDU (intravenous drug user)   Septic embolism ([place])   Sepsis ([place])   Sinus tachycardia   Cellulitis of left foot   Multifocal pneumonia    enoxaparin (LOVENOX) injection  40 mg Subcutaneous Q24H   morphine  30 mg Oral Q12H   polyethylene glycol  17 g Oral BID     HPI: Cassandra Roberts is a 32 y.o. female with previous medical history of ADD, polysubstance use (meth, heroin, opiates) and Hepatitis C presented with shortness of breath, weakness and concern for fentanyl withdrawal.   Cassandra Roberts was recently seen in the ED  at [facility] with complaints of shortness of breath, cough and hemoptysis on 10/30/22. Chest x-ray showed bilateral upper lobe opacities with subsequent CTa negative for pulmonary embolism and with heterogenous and consolidative airspace opacity. Blood cultures were without growth and inflammatory markers were normal. She left AMA.  Presented to ED on 11/18/22 with continued shortness of breath, weakness and concern for fentanyl withdrawal. Has continued to use drugs with toxicology positive for cocaine, and amphetamines. Chest x-ray with left basilar atelectasis or pneumonia. CT chest with multifocal lung consolidation with pneumonia greatest in the left lower lobe; and bilateral peripheral nodules (at least 1 cavitary) raising concern for septic emboli. Febrile with temperature of 103.4 F.  Initially started on vancomycin and cefepime. Blood cultures are positive for MRSA bacteremia.   After leaving AMA from [facility] her symptoms have progressively worsened. Has not taken any additional antibiotics beyond what she received at the hospital. Now with increased chest pain, left shoulder pain, left thigh pain. Having increased pain levels and wanting her oxygen placed back on her. No previous treatment for hepatitis C and does not have any current abdominal pain or concerns. Previous HIV negative.    Review of Systems: Review of Systems  Constitutional:  Negative for chills, fever and weight loss.  Respiratory:  Positive for shortness of breath. Negative for cough and wheezing.   Cardiovascular:  Positive for chest pain. Negative for leg swelling.  Gastrointestinal:  Negative for abdominal pain, constipation, diarrhea, nausea and vomiting.  Musculoskeletal:        Positive for left shoulder pain and left thigh pain.   Skin:  Negative for rash.     Past Medical History:  Diagnosis Date   Attention deficit disorder (ADD)    Hepatitis C    Substance abuse (HCC)     Social  History   Tobacco Use   Smoking status: Every Day    Packs/day: 0.25    Years: 2.00    Total pack years: 0.50    Types: Cigarettes   Smokeless tobacco: Never  Vaping Use   Vaping Use: Every day  Substance Use Topics   Alcohol use: Not Currently    Comment: daily   Drug use: Yes    Frequency: 1.0 times per week    Types: Marijuana, Cocaine, Amphetamines, Fentanyl    Comment: Fentanyl    Family History  Problem Relation Age of Onset   Heart attack Mother     Allergies  Allergen Reactions   Dextromethorphan     Shaking    OBJECTIVE: Blood pressure 106/72, pulse (!) 101, temperature 97.8 F (36.6 C), temperature source Oral, resp. rate 19, height 5\' 2"  (1.575 m), weight 56.7 kg, last menstrual period 11/04/2022, SpO2 99 %.  Physical Exam Constitutional:      General: She is not in acute distress.    Appearance: She is well-developed. She is ill-appearing.  Cardiovascular:     Rate and Rhythm: Regular rhythm. Tachycardia present.     Heart sounds: Murmur heard.  Pulmonary:     Effort: Pulmonary effort is normal.     Breath sounds: Normal breath sounds.  Musculoskeletal:     Comments: Left shoulder with no obvious deformity, discoloration or edema. There is generalized tenderness and decreased range of motion in all directions. Left thigh no obvious deformity, discoloration or edema. Generalized tenderness and able to move her left thigh. Areas on skin consistent with skin picking.   Skin:    General: Skin is warm and dry.  Neurological:     Mental Status: She is alert and oriented to person, place, and time.  Psychiatric:        Thought Content: Thought content normal.        Judgment: Judgment normal.     Lab Results Lab Results  Component Value Date   WBC 15.6 (H) 11/19/2022   HGB 11.2 (L) 11/19/2022   HCT 31.8 (L) 11/19/2022   MCV 85.9 11/19/2022   PLT 194 11/19/2022    Lab Results  Component Value Date   CREATININE 0.67 11/19/2022   BUN 7 11/19/2022    NA 134 (L) 11/19/2022   K 3.1 (L) 11/19/2022   CL 101 11/19/2022   CO2 24 11/19/2022    Lab Results  Component Value Date   ALT 42 11/19/2022   AST 19 11/19/2022   ALKPHOS 162 (H) 11/19/2022   BILITOT 0.9 11/19/2022     Microbiology: Recent Results (from the past 240 hour(s))  Resp panel by RT-PCR (RSV, Flu A&B, Covid) Anterior Nasal Swab     Status: None   Collection Time: 11/18/22 10:08 AM   Specimen: Anterior Nasal Swab  Result Value Ref Range Status   SARS Coronavirus 2 by RT PCR NEGATIVE NEGATIVE Final    Comment: (NOTE) SARS-CoV-2 target nucleic acids are NOT DETECTED.  The SARS-CoV-2 RNA is generally detectable in upper respiratory specimens during the acute phase of infection. The lowest concentration of SARS-CoV-2 viral copies this assay can detect is 138  copies/mL. A negative result does not preclude SARS-Cov-2 infection and should not be used as the sole basis for treatment or other patient management decisions. A negative result may occur with  improper specimen collection/handling, submission of specimen other than nasopharyngeal swab, presence of viral mutation(s) within the areas targeted by this assay, and inadequate number of viral copies(<138 copies/mL). A negative result must be combined with clinical observations, patient history, and epidemiological information. The expected result is Negative.  Fact Sheet for Patients:  EntrepreneurPulse.com.au  Fact Sheet for Healthcare Providers:  IncredibleEmployment.be  This test is no t yet approved or cleared by the [place] FDA and  has been authorized for detection and/or diagnosis of SARS-CoV-2 by FDA under an Emergency Use Authorization (EUA). This EUA will remain  in effect (meaning this test can be used) for the duration of the COVID-19 declaration under Section 564(b)(1) of the Act, 21 U.S.C.section 360bbb-3(b)(1), unless the authorization is terminated  or  revoked sooner.       Influenza A by PCR NEGATIVE NEGATIVE Final   Influenza B by PCR NEGATIVE NEGATIVE Final    Comment: (NOTE) The Xpert Xpress SARS-CoV-2/FLU/RSV plus assay is intended as an aid in the diagnosis of influenza from Nasopharyngeal swab specimens and should not be used as a sole basis for treatment. Nasal washings and aspirates are unacceptable for Xpert Xpress SARS-CoV-2/FLU/RSV testing.  Fact Sheet for Patients: EntrepreneurPulse.com.au  Fact Sheet for Healthcare Providers: IncredibleEmployment.be  This test is not yet approved or cleared by the [place] FDA and has been authorized for detection and/or diagnosis of SARS-CoV-2 by FDA under an Emergency Use Authorization (EUA). This EUA will remain in effect (meaning this test can be used) for the duration of the COVID-19 declaration under Section 564(b)(1) of the Act, 21 U.S.C. section 360bbb-3(b)(1), unless the authorization is terminated or revoked.     Resp Syncytial Virus by PCR NEGATIVE NEGATIVE Final    Comment: (NOTE) Fact Sheet for Patients: EntrepreneurPulse.com.au  Fact Sheet for Healthcare Providers: IncredibleEmployment.be  This test is not yet approved or cleared by the [place] FDA and has been authorized for detection and/or diagnosis of SARS-CoV-2 by FDA under an Emergency Use Authorization (EUA). This EUA will remain in effect (meaning this test can be used) for the duration of the COVID-19 declaration under Section 564(b)(1) of the Act, 21 U.S.C. section 360bbb-3(b)(1), unless the authorization is terminated or revoked.  Performed at [facility], [place] [address] [place], [place] [address]   Blood culture (routine x 2)     Status: None (Preliminary result)   Collection Time: 11/18/22 12:07 PM   Specimen: BLOOD  Result Value Ref Range Status   Specimen Description BLOOD SITE NOT SPECIFIED  Final    Special Requests   Final    BOTTLES DRAWN AEROBIC AND ANAEROBIC Blood Culture adequate volume   Culture  Setup Time   Final    GRAM POSITIVE COCCI IN BOTH AEROBIC AND ANAEROBIC BOTTLES CRITICAL RESULT CALLED TO, READ BACK BY AND VERIFIED WITH: PHARMD G. ABBOTT 11/19/22 @ 338 BY AB Performed at [facility], [place] [address] [place], [place] [address]    Culture [facility] POSITIVE COCCI  Final   Report Status PENDING  Incomplete  Blood Culture ID Panel (Reflexed)     Status: Abnormal   Collection Time: 11/18/22 12:07 PM  Result Value Ref Range Status   Enterococcus faecalis NOT DETECTED NOT DETECTED Final   Enterococcus Faecium NOT DETECTED NOT DETECTED  Final   Listeria monocytogenes NOT DETECTED NOT DETECTED Final   Staphylococcus species DETECTED (A) NOT DETECTED Final    Comment: CRITICAL RESULT CALLED TO, READ BACK BY AND VERIFIED WITH: PHARMD G. ABBOTT 11/19/22 @ 338 BY AB    Staphylococcus aureus (BCID) DETECTED (A) NOT DETECTED Final    Comment: Methicillin (oxacillin)-resistant Staphylococcus aureus (MRSA). MRSA is predictably resistant to beta-lactam antibiotics (except ceftaroline). Preferred therapy is vancomycin unless clinically contraindicated. Patient requires contact precautions if  hospitalized. CRITICAL RESULT CALLED TO, READ BACK BY AND VERIFIED WITH: PHARMD G. ABBOTT 11/19/22 @ 338 BY AB    Staphylococcus epidermidis NOT DETECTED NOT DETECTED Final   Staphylococcus lugdunensis NOT DETECTED NOT DETECTED Final   Streptococcus species NOT DETECTED NOT DETECTED Final   Streptococcus agalactiae NOT DETECTED NOT DETECTED Final   Streptococcus pneumoniae NOT DETECTED NOT DETECTED Final   Streptococcus pyogenes NOT DETECTED NOT DETECTED Final   A.calcoaceticus-baumannii NOT DETECTED NOT DETECTED Final   Bacteroides fragilis NOT DETECTED NOT DETECTED Final   Enterobacterales NOT DETECTED NOT DETECTED Final   Enterobacter cloacae complex NOT DETECTED NOT DETECTED Final    Escherichia coli NOT DETECTED NOT DETECTED Final   Klebsiella aerogenes NOT DETECTED NOT DETECTED Final   Klebsiella oxytoca NOT DETECTED NOT DETECTED Final   Klebsiella pneumoniae NOT DETECTED NOT DETECTED Final   Proteus species NOT DETECTED NOT DETECTED Final   Salmonella species NOT DETECTED NOT DETECTED Final   Serratia marcescens NOT DETECTED NOT DETECTED Final   Haemophilus influenzae NOT DETECTED NOT DETECTED Final   Neisseria meningitidis NOT DETECTED NOT DETECTED Final   Pseudomonas aeruginosa NOT DETECTED NOT DETECTED Final   Stenotrophomonas maltophilia NOT DETECTED NOT DETECTED Final   Candida albicans NOT DETECTED NOT DETECTED Final   Candida auris NOT DETECTED NOT DETECTED Final   Candida glabrata NOT DETECTED NOT DETECTED Final   Candida krusei NOT DETECTED NOT DETECTED Final   Candida parapsilosis NOT DETECTED NOT DETECTED Final   Candida tropicalis NOT DETECTED NOT DETECTED Final   Cryptococcus neoformans/gattii NOT DETECTED NOT DETECTED Final   Meth resistant mecA/C and MREJ DETECTED (A) NOT DETECTED Final    Comment: CRITICAL RESULT CALLED TO, READ BACK BY AND VERIFIED WITH: PHARMD G. ABBOTT 11/19/22 @ 338 BY AB Performed at [facility], [address], [place], [place] [address]      Terri Piedra, [place]  11/19/2022  8:37 AM

## 2022-11-19 NOTE — Progress Notes (Signed)
[facility] PROGRESS NOTE    Progress Note  Cassandra Roberts  [id number] DOB: 1991-09-25 DOA: 11/18/2022 PCP: Patient, No Pcp Per     Brief Narrative:   Cassandra Roberts is an 32 y.o. 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  She is complaining of left thigh and hip pain. MRI of the hip and left shoulder were ordered. ID was consulted recommended TEE. Culture and sensitivities are pending. Has defervesce. Complaining of lumbar spine pain.  Check an MRI.  Polysubstance abuse (HCC)/ IVDU (intravenous drug user) Started on MS Contin.    DVT prophylaxis: lovenox Family Communication:none Status is: Inpatient Remains inpatient appropriate because: MRSA bacteremia    Code Status:     Code Status Orders  (From admission, onward)           Start     Ordered   11/18/22 1623  Full code  Continuous       Question:  By:  Answer:  Consent: discussion documented in EHR   11/18/22 1625           Code Status History     Date Active Date Inactive Code Status Order ID Comments User Context   03/23/2020 1622 03/25/2020 0102 Full Code [id number]  Charlann Lange, MD Inpatient   08/21/2013 1049 08/21/2013 2156 Full Code [id number]  Dewaine Oats, PA-C ED         IV Access:   Peripheral IV   Procedures and diagnostic studies:   ECHOCARDIOGRAM COMPLETE  Result Date: 11/19/2022     ECHOCARDIOGRAM REPORT   Patient Name:   Cassandra Roberts [facility] Date of Exam: 11/19/2022 Medical Rec #:  [id number]      Height:       62.0 in Accession #:    [id number]     Weight:       125.0 lb Date of Birth:  [birth year]      BSA:          1.566 m Patient Age:    31 years       BP:           106/72 mmHg Patient Gender: F              HR:           110 bpm. Exam Location:  Inpatient Procedure: 2D Echo, Cardiac Doppler and Color Doppler Indications:    [place] R01.1  History:        Patient has no prior history of Echocardiogram examinations.                 Arrythmias:Tachycardia; Risk Factors:Current Smoker.  Sonographer:    Ronny Flurry Referring Phys: [place]  1. Left ventricular ejection fraction, by estimation, is 70 to 75%. The left ventricle has hyperdynamic function. The left ventricle has no regional wall motion abnormalities. Left ventricular diastolic parameters were normal.  2. Right ventricular systolic function is normal. The right ventricular size  is normal. Tricuspid regurgitation signal is inadequate for assessing PA pressure.  3. The mitral valve is grossly normal. Trivial mitral valve regurgitation. No evidence of mitral stenosis.  4. The aortic valve is tricuspid. Aortic valve regurgitation is not visualized. No aortic stenosis is present.  5. The inferior vena cava is normal in size with <50% respiratory variability, suggesting right atrial pressure of 8 mmHg. Conclusion(s)/Recommendation(s): Normal biventricular function without evidence of hemodynamically significant valvular heart disease. FINDINGS  Left Ventricle: Left ventricular ejection fraction, by estimation, is 70 to 75%. The left ventricle has hyperdynamic function. The left ventricle has no regional wall motion abnormalities. The left ventricular internal cavity size was normal in size. There is no left ventricular hypertrophy. Left ventricular diastolic parameters were normal. Right Ventricle: The right  ventricular size is normal. No increase in right ventricular wall thickness. Right ventricular systolic function is normal. Tricuspid regurgitation signal is inadequate for assessing PA pressure. Left Atrium: Left atrial size was normal in size. Right Atrium: Right atrial size was normal in size. Pericardium: There is no evidence of pericardial effusion. Mitral Valve: The mitral valve is grossly normal. Trivial mitral valve regurgitation. No evidence of mitral valve stenosis. Tricuspid Valve: The tricuspid valve is grossly normal. Tricuspid valve regurgitation is trivial. No evidence of tricuspid stenosis. Aortic Valve: The aortic valve is tricuspid. Aortic valve regurgitation is not visualized. No aortic stenosis is present. Aortic valve mean gradient measures 6.0 mmHg. Aortic valve peak gradient measures 11.2 mmHg. Aortic valve area, by VTI measures 2.09  cm. Pulmonic Valve: The pulmonic valve was grossly normal. Pulmonic valve regurgitation is not visualized. No evidence of pulmonic stenosis. Aorta: The aortic root and ascending aorta are structurally normal, with no evidence of dilitation. Venous: The right lower pulmonary vein is normal. The inferior vena cava is normal in size with less than 50% respiratory variability, suggesting right atrial pressure of 8 mmHg. IAS/Shunts: The atrial septum is grossly normal.  LEFT VENTRICLE PLAX 2D LVIDd:         3.90 cm   Diastology LVIDs:         2.60 cm   LV e' medial:    8.38 cm/s LV PW:         0.90 cm   LV E/e' medial:  12.9 LV IVS:        0.90 cm   LV e' lateral:   9.25 cm/s LVOT diam:     1.80 cm   LV E/e' lateral: 11.7 LV SV:         53 LV SV Index:   34 LVOT Area:     2.54 cm  RIGHT VENTRICLE             IVC RV Basal diam:  3.30 cm     IVC diam: 1.80 cm RV S prime:     13.40 cm/s TAPSE (M-mode): 2.4 cm LEFT ATRIUM             Index        RIGHT ATRIUM           Index LA diam:        3.20 cm 2.04 cm/m   RA Area:     15.90 cm LA Vol (A2C):   12.0 ml 7.67 ml/m    RA Volume:   39.50 ml  25.23 ml/m LA Vol (A4C):   27.4 ml 17.50 ml/m LA Biplane Vol: 18.9 ml 12.07 ml/m  AORTIC VALVE AV Area (Vmax):    2.20  cm AV Area (Vmean):   2.14 cm AV Area (VTI):     2.09 cm AV Vmax:           167.00 cm/s AV Vmean:          116.000 cm/s AV VTI:            0.255 m AV Peak Grad:      11.2 mmHg AV Mean Grad:      6.0 mmHg LVOT Vmax:         144.50 cm/s LVOT Vmean:        97.750 cm/s LVOT VTI:          0.209 m LVOT/AV VTI ratio: 0.82  AORTA Ao Root diam: 2.80 cm Ao Asc diam:  2.80 cm MITRAL VALVE MV Area (PHT): 5.31 cm     SHUNTS MV Decel Time: 143 msec     Systemic VTI:  0.21 m MV E velocity: 108.00 cm/s  Systemic Diam: 1.80 cm MV A velocity: 99.40 cm/s MV E/A ratio:  1.09 Eleonore Chiquito MD Electronically signed by Eleonore Chiquito MD Signature Date/Time: 11/19/2022/10:39:33 AM    Final    CT Chest Wo Contrast  Result Date: 11/18/2022 CLINICAL DATA:  Pneumonia.  History of IV drug use. EXAM: CT CHEST WITHOUT CONTRAST TECHNIQUE: Multidetector CT imaging of the chest was performed following the standard protocol without IV contrast. RADIATION DOSE REDUCTION: This exam was performed according to the departmental dose-optimization program which includes automated exposure control, adjustment of the mA and/or kV according to patient size and/or use of iterative reconstruction technique. COMPARISON:  Chest CTA 10/30/2022 FINDINGS: Cardiovascular: Normal caliber of the thoracic aorta. Normal heart size. No pericardial effusion. Mediastinum/Nodes: No enlarged axillary lymph nodes. Limited assessment for mediastinal and hilar lymphadenopathy due to the absence of IV contrast and paucity of mediastinal fat. Unremarkable included thyroid. Nondistended esophagus. Lungs/Pleura: New small left and trace right pleural effusions. No pneumothorax. Much of the patchy ground-glass and consolidative opacity on the prior CT has resolved, however there is new more extensive consolidation in the left lower  lobe, inferior lingula, and anteromedial left upper lobe. There also multiple new small peripheral nodular opacities in the upper lobes, right middle lobe, and right lower lobe, some of which demonstrate surrounding ground-glass density and with at least one demonstrating a small focus of central cavitation (series 6, image 48 in the right upper lobe). Upper Abdomen: No acute abnormality. Musculoskeletal: No acute osseous abnormality or suspicious osseous lesion. IMPRESSION: 1. Multifocal lung consolidation consistent with pneumonia, greatest in the left lower lobe and left upper lobe/lingula. Bilateral peripheral nodules (with at least 1 being cavitary) raise the possibility of septic emboli. 2. New small left and trace right pleural effusions. Electronically Signed   By: Logan Bores M.D.   On: 11/18/2022 14:33   DG Chest 2 View  Result Date: 11/18/2022 CLINICAL DATA:  Shortness of breath. EXAM: CHEST - 2 VIEW COMPARISON:  October 30, 2022. FINDINGS: Stable cardiomediastinal silhouette. Left basilar atelectasis or pneumonia is noted with associated pleural effusion. Minimal right basilar subsegmental atelectasis or infiltrate is noted. Bony thorax is unremarkable. IMPRESSION: Left basilar atelectasis or pneumonia is noted with associated pleural effusion. Minimal right basilar subsegmental atelectasis or infiltrate. Electronically Signed   By: Marijo Conception M.D.   On: 11/18/2022 10:15     Medical Consultants:   None.   Subjective:    Cassandra Roberts relates she feels slightly better than yesterday was too uncomfortable complaining of lumbar  back pain.  Objective:    Vitals:   11/18/22 2223 11/19/22 0058 11/19/22 0300 11/19/22 0400  BP: (!) 144/96 136/69  106/72  Pulse: (!) 126 (!) 124 (!) 104 (!) 101  Resp: (!) 30 (!) 27 (!) 27 19  Temp: (!) 103.4 F (39.7 C) 100.1 F (37.8 C)  97.8 F (36.6 C)  TempSrc: Rectal Oral  Oral  SpO2: 97%  96% 99%  Weight:      Height:       SpO2: 99  % O2 Flow Rate (L/min): 2 L/min   Intake/Output Summary (Last 24 hours) at 11/19/2022 1106 Last data filed at 11/19/2022 0400 Gross per 24 hour  Intake 1825.83 ml  Output 650 ml  Net 1175.83 ml   Filed Weights   11/18/22 0805  Weight: 56.7 kg    Exam: General exam: In no acute distress. Respiratory system: Good air movement and clear to auscultation. Cardiovascular system: S1 & S2 heard, RRR. No JVD. Gastrointestinal system: Abdomen is nondistended, soft and nontender.  Extremities: Erythematous left shoulder Skin: No rashes, lesions or ulcers Psychiatry: Judgement and insight appear normal. Mood & affect appropriate.    Data Reviewed:    Labs: Basic Metabolic Panel: Recent Labs  Lab 11/18/22 0828 11/19/22 0316  NA 130* 134*  K 3.7 3.1*  CL 97* 101  CO2 23 24  GLUCOSE 125* 149*  BUN 5* 7  CREATININE 0.59 0.67  CALCIUM 8.6* 8.0*   GFR Estimated Creatinine Clearance: 80.6 mL/min (by C-G formula based on SCr of 0.67 mg/dL). Liver Function Tests: Recent Labs  Lab 11/19/22 0316  AST 19  ALT 42  ALKPHOS 162*  BILITOT 0.9  PROT 5.6*  ALBUMIN 2.3*   No results for input(s): "LIPASE", "AMYLASE" in the last 168 hours. No results for input(s): "AMMONIA" in the last 168 hours. Coagulation profile No results for input(s): "INR", "PROTIME" in the last 168 hours. COVID-19 Labs  No results for input(s): "DDIMER", "FERRITIN", "LDH", "CRP" in the last 72 hours.  Lab Results  Component Value Date   SARSCOV2NAA NEGATIVE 11/18/2022   [place] NEGATIVE 03/23/2020    CBC: Recent Labs  Lab 11/18/22 0828 11/19/22 0316  WBC 15.6* 15.6*  HGB 12.6 11.2*  HCT 36.4 31.8*  MCV 87.3 85.9  PLT 216 194   Cardiac Enzymes: No results for input(s): "CKTOTAL", "CKMB", "CKMBINDEX", "TROPONINI" in the last 168 hours. BNP (last 3 results) No results for input(s): "PROBNP" in the last 8760 hours. CBG: No results for input(s): "GLUCAP" in the last 168 hours. D-Dimer: No  results for input(s): "DDIMER" in the last 72 hours. Hgb A1c: No results for input(s): "HGBA1C" in the last 72 hours. Lipid Profile: No results for input(s): "CHOL", "HDL", "LDLCALC", "TRIG", "CHOLHDL", "LDLDIRECT" in the last 72 hours. Thyroid function studies: No results for input(s): "TSH", "T4TOTAL", "T3FREE", "THYROIDAB" in the last 72 hours.  Invalid input(s): "FREET3" Anemia work up: No results for input(s): "VITAMINB12", "FOLATE", "FERRITIN", "TIBC", "IRON", "RETICCTPCT" in the last 72 hours. Sepsis Labs: Recent Labs  Lab 11/18/22 0828 11/18/22 1303 11/19/22 0316  WBC 15.6*  --  15.6*  LATICACIDVEN  --  1.4  --    Microbiology Recent Results (from the past 240 hour(s))  Resp panel by RT-PCR (RSV, Flu A&B, Covid) Anterior Nasal Swab     Status: None   Collection Time: 11/18/22 10:08 AM   Specimen: Anterior Nasal Swab  Result Value Ref Range Status   SARS Coronavirus 2 by RT PCR NEGATIVE NEGATIVE  Final    Comment: (NOTE) SARS-CoV-2 target nucleic acids are NOT DETECTED.  The SARS-CoV-2 RNA is generally detectable in upper respiratory specimens during the acute phase of infection. The lowest concentration of SARS-CoV-2 viral copies this assay can detect is 138 copies/mL. A negative result does not preclude SARS-Cov-2 infection and should not be used as the sole basis for treatment or other patient management decisions. A negative result may occur with  improper specimen collection/handling, submission of specimen other than nasopharyngeal swab, presence of viral mutation(s) within the areas targeted by this assay, and inadequate number of viral copies(<138 copies/mL). A negative result must be combined with clinical observations, patient history, and epidemiological information. The expected result is Negative.  Fact Sheet for Patients:  EntrepreneurPulse.com.au  Fact Sheet for Healthcare Providers:   IncredibleEmployment.be  This test is no t yet approved or cleared by the [place] FDA and  has been authorized for detection and/or diagnosis of SARS-CoV-2 by FDA under an Emergency Use Authorization (EUA). This EUA will remain  in effect (meaning this test can be used) for the duration of the COVID-19 declaration under Section 564(b)(1) of the Act, 21 U.S.C.section 360bbb-3(b)(1), unless the authorization is terminated  or revoked sooner.       Influenza A by PCR NEGATIVE NEGATIVE Final   Influenza B by PCR NEGATIVE NEGATIVE Final    Comment: (NOTE) The Xpert Xpress SARS-CoV-2/FLU/RSV plus assay is intended as an aid in the diagnosis of influenza from Nasopharyngeal swab specimens and should not be used as a sole basis for treatment. Nasal washings and aspirates are unacceptable for Xpert Xpress SARS-CoV-2/FLU/RSV testing.  Fact Sheet for Patients: EntrepreneurPulse.com.au  Fact Sheet for Healthcare Providers: IncredibleEmployment.be  This test is not yet approved or cleared by the [place] FDA and has been authorized for detection and/or diagnosis of SARS-CoV-2 by FDA under an Emergency Use Authorization (EUA). This EUA will remain in effect (meaning this test can be used) for the duration of the COVID-19 declaration under Section 564(b)(1) of the Act, 21 U.S.C. section 360bbb-3(b)(1), unless the authorization is terminated or revoked.     Resp Syncytial Virus by PCR NEGATIVE NEGATIVE Final    Comment: (NOTE) Fact Sheet for Patients: EntrepreneurPulse.com.au  Fact Sheet for Healthcare Providers: IncredibleEmployment.be  This test is not yet approved or cleared by the [place] FDA and has been authorized for detection and/or diagnosis of SARS-CoV-2 by FDA under an Emergency Use Authorization (EUA). This EUA will remain in effect (meaning this test can be used) for  the duration of the COVID-19 declaration under Section 564(b)(1) of the Act, 21 U.S.C. section 360bbb-3(b)(1), unless the authorization is terminated or revoked.  Performed at [facility], [place] [address] [place], [place] [address]   Blood culture (routine x 2)     Status: None (Preliminary result)   Collection Time: 11/18/22 12:07 PM   Specimen: BLOOD  Result Value Ref Range Status   Specimen Description BLOOD SITE NOT SPECIFIED  Final   Special Requests   Final    BOTTLES DRAWN AEROBIC AND ANAEROBIC Blood Culture adequate volume   Culture  Setup Time   Final    GRAM POSITIVE COCCI IN BOTH AEROBIC AND ANAEROBIC BOTTLES CRITICAL RESULT CALLED TO, READ BACK BY AND VERIFIED WITH: PHARMD G. ABBOTT 11/19/22 @ 338 BY AB Performed at [facility], [place] [address], [place], [place] [address]    Culture GRAM POSITIVE COCCI  Final   Report Status PENDING  Incomplete  Blood Culture ID Panel (Reflexed)     Status: Abnormal   Collection Time: 11/18/22 12:07 PM  Result Value Ref Range Status   Enterococcus faecalis NOT DETECTED NOT DETECTED Final   Enterococcus Faecium NOT DETECTED NOT DETECTED Final   Listeria monocytogenes NOT DETECTED NOT DETECTED Final   Staphylococcus species DETECTED (A) NOT DETECTED Final    Comment: CRITICAL RESULT CALLED TO, READ BACK BY AND VERIFIED WITH: PHARMD G. ABBOTT 11/19/22 @ 338 BY AB    Staphylococcus aureus (BCID) DETECTED (A) NOT DETECTED Final    Comment: Methicillin (oxacillin)-resistant Staphylococcus aureus (MRSA). MRSA is predictably resistant to beta-lactam antibiotics (except ceftaroline). Preferred therapy is vancomycin unless clinically contraindicated. Patient requires contact precautions if  hospitalized. CRITICAL RESULT CALLED TO, READ BACK BY AND VERIFIED WITH: PHARMD G. ABBOTT 11/19/22 @ 338 BY AB    Staphylococcus epidermidis NOT DETECTED NOT DETECTED Final   Staphylococcus lugdunensis NOT DETECTED NOT DETECTED Final    Streptococcus species NOT DETECTED NOT DETECTED Final   Streptococcus agalactiae NOT DETECTED NOT DETECTED Final   Streptococcus pneumoniae NOT DETECTED NOT DETECTED Final   Streptococcus pyogenes NOT DETECTED NOT DETECTED Final   A.calcoaceticus-baumannii NOT DETECTED NOT DETECTED Final   Bacteroides fragilis NOT DETECTED NOT DETECTED Final   Enterobacterales NOT DETECTED NOT DETECTED Final   Enterobacter cloacae complex NOT DETECTED NOT DETECTED Final   Escherichia coli NOT DETECTED NOT DETECTED Final   Klebsiella aerogenes NOT DETECTED NOT DETECTED Final   Klebsiella oxytoca NOT DETECTED NOT DETECTED Final   Klebsiella pneumoniae NOT DETECTED NOT DETECTED Final   Proteus species NOT DETECTED NOT DETECTED Final   Salmonella species NOT DETECTED NOT DETECTED Final   Serratia marcescens NOT DETECTED NOT DETECTED Final   Haemophilus influenzae NOT DETECTED NOT DETECTED Final   Neisseria meningitidis NOT DETECTED NOT DETECTED Final   Pseudomonas aeruginosa NOT DETECTED NOT DETECTED Final   Stenotrophomonas maltophilia NOT DETECTED NOT DETECTED Final   Candida albicans NOT DETECTED NOT DETECTED Final   Candida auris NOT DETECTED NOT DETECTED Final   Candida glabrata NOT DETECTED NOT DETECTED Final   Candida krusei NOT DETECTED NOT DETECTED Final   Candida parapsilosis NOT DETECTED NOT DETECTED Final   Candida tropicalis NOT DETECTED NOT DETECTED Final   Cryptococcus neoformans/gattii NOT DETECTED NOT DETECTED Final   Meth resistant mecA/C and MREJ DETECTED (A) NOT DETECTED Final    Comment: CRITICAL RESULT CALLED TO, READ BACK BY AND VERIFIED WITH: PHARMD G. ABBOTT 11/19/22 @ 338 BY AB Performed at [facility], [address], [place], [place] [address]      Medications:    enoxaparin (LOVENOX) injection  40 mg Subcutaneous Q24H   morphine  30 mg Oral Q12H   polyethylene glycol  17 g Oral BID   Continuous Infusions:  sodium chloride 75 mL/hr at 11/18/22 2253   vancomycin  750 mg (11/19/22 0932)      LOS: 1 day   Charlynne Cousins  [facility]  11/19/2022, 11:06 AM

## 2022-11-20 LAB — BASIC METABOLIC PANEL
Anion gap: 9 (ref 5–15)
BUN: 9 mg/dL (ref 6–20)
CO2: 23 mmol/L (ref 22–32)
Calcium: 8.2 mg/dL — ABNORMAL LOW (ref 8.9–10.3)
Chloride: 102 mmol/L (ref 98–111)
Creatinine, Ser: 0.6 mg/dL (ref 0.44–1.00)
GFR, Estimated: >60 mL/min (ref >60)
Glucose, Bld: 99 mg/dL (ref 70–99)
Potassium: 4.3 mmol/L (ref 3.5–5.1)
Sodium: 134 mmol/L — ABNORMAL LOW (ref 135–145)

## 2022-11-20 LAB — HEPATITIS B SURFACE ANTIGEN: Hepatitis B Surface Ag: NONREACTIVE

## 2022-11-20 MED ORDER — FENTANYL CITRATE PF 50 MCG/ML IJ SOSY: 50.0000 ug | PREFILLED_SYRINGE | Freq: Once | INTRAMUSCULAR | Status: DC

## 2022-11-20 MED ORDER — FENTANYL CITRATE PF 50 MCG/ML IJ SOSY
50.0000 ug | PREFILLED_SYRINGE | Freq: Once | INTRAMUSCULAR | Status: DC | PRN
  Filled 2022-11-20: qty 1

## 2022-11-20 NOTE — Progress Notes (Signed)
PHARMACY - PHYSICIAN COMMUNICATION CRITICAL VALUE ALERT - BLOOD CULTURE IDENTIFICATION (BCID)  Cassandra Roberts is an 32 y.o. female with h/o IVDU who presented to [facility] on 11/18/2022 with a chief complaint of chest pain and shortness of breath and PNA  Assessment:   4/6 Bcx positive for MRSA   Name of physician (or Provider) Contacted:  T. Opyd  Current antibiotics:  Vancomycin   Changes to prescribed antibiotics recommended:  No changes at this time  Results for orders placed or performed during the hospital encounter of 11/18/22  Blood Culture ID Panel (Reflexed) (Collected: 11/18/2022 12:07 PM)  Result Value Ref Range   Enterococcus faecalis NOT DETECTED NOT DETECTED   Enterococcus Faecium NOT DETECTED NOT DETECTED   Listeria monocytogenes NOT DETECTED NOT DETECTED   Staphylococcus species DETECTED (A) NOT DETECTED   Staphylococcus aureus (BCID) DETECTED (A) NOT DETECTED   Staphylococcus epidermidis NOT DETECTED NOT DETECTED   Staphylococcus lugdunensis NOT DETECTED NOT DETECTED   Streptococcus species NOT DETECTED NOT DETECTED   Streptococcus agalactiae NOT DETECTED NOT DETECTED   Streptococcus pneumoniae NOT DETECTED NOT DETECTED   Streptococcus pyogenes NOT DETECTED NOT DETECTED   A.calcoaceticus-baumannii NOT DETECTED NOT DETECTED   Bacteroides fragilis NOT DETECTED NOT DETECTED   Enterobacterales NOT DETECTED NOT DETECTED   Enterobacter cloacae complex NOT DETECTED NOT DETECTED   Escherichia coli NOT DETECTED NOT DETECTED   Klebsiella aerogenes NOT DETECTED NOT DETECTED   Klebsiella oxytoca NOT DETECTED NOT DETECTED   Klebsiella pneumoniae NOT DETECTED NOT DETECTED   Proteus species NOT DETECTED NOT DETECTED   Salmonella species NOT DETECTED NOT DETECTED   Serratia marcescens NOT DETECTED NOT DETECTED   Haemophilus influenzae NOT DETECTED NOT DETECTED   Neisseria meningitidis NOT DETECTED NOT DETECTED   Pseudomonas aeruginosa NOT DETECTED NOT DETECTED    Stenotrophomonas maltophilia NOT DETECTED NOT DETECTED   Candida albicans NOT DETECTED NOT DETECTED   Candida auris NOT DETECTED NOT DETECTED   Candida glabrata NOT DETECTED NOT DETECTED   Candida krusei NOT DETECTED NOT DETECTED   Candida parapsilosis NOT DETECTED NOT DETECTED   Candida tropicalis NOT DETECTED NOT DETECTED   Cryptococcus neoformans/gattii NOT DETECTED NOT DETECTED   Meth resistant mecA/C and MREJ DETECTED (A) NOT DETECTED    [name], PharmD Clinical Pharmacist 11/20/2022 9:03 PM

## 2022-11-20 NOTE — Progress Notes (Signed)
Patient given her due pain medication and was sent to MRI. She's still in pain while in MRI and cannot be able to tolerate lying flat. MRI wasn't done and patient was sent back to floor. T. Opyd, MD made aware.

## 2022-11-20 NOTE — Progress Notes (Signed)
[facility] PROGRESS NOTE    Progress Note  Nicha Hemann  CHE:[id number] DOB: 07/17/1991 DOA: 11/18/2022 PCP: Patient, No Pcp Per     Brief Narrative:   Preslyn Warr is an 32 y.o. 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   MRI of the left shoulder and left hip and lumbar spine is pending ID was consulted recommended TEE on Tuesday. Culture and sensitivities are pending. Has defervesce. She did not complete MRI due to pain we will give her single dose of IV fentanyl right before MRI.  Polysubstance abuse (HCC)/ IVDU (intravenous drug user) Continue on MS Contin.    DVT prophylaxis: lovenox Family Communication:none Status is: Inpatient Remains inpatient appropriate because: MRSA bacteremia    Code Status:     Code Status Orders  (From admission, onward)           Start     Ordered   11/18/22 1623  Full code  Continuous       Question:  By:  Answer:  Consent: discussion documented in EHR   11/18/22 1625           Code Status History     Date Active Date Inactive Code Status Order ID Comments User Context   03/23/2020 1622 03/25/2020 0102 Full Code [id number]  Charlann Lange, MD Inpatient   08/21/2013 1049 08/21/2013 2156 Full Code [id number]  Dewaine Oats, PA-C ED         IV Access:   Peripheral IV   Procedures and diagnostic studies:   ECHOCARDIOGRAM  COMPLETE  Result Date: 11/19/2022    ECHOCARDIOGRAM REPORT   Patient Name:   [facility] Date of Exam: 11/19/2022 Medical Rec #:  [id number]      Height:       62.0 in Accession #:    [id number]     Weight:       125.0 lb Date of Birth:  20-May-1991      BSA:          1.566 m Patient Age:    31 years       BP:           106/72 mmHg Patient Gender: F              HR:           110 bpm. Exam Location:  Inpatient Procedure: 2D Echo, Cardiac Doppler and Color Doppler Indications:    [place] R01.1  History:        Patient has no prior history of Echocardiogram examinations.                 Arrythmias:Tachycardia; Risk Factors:Current Smoker.  Sonographer:    Ronny Flurry Referring Phys: [place]  1. Left ventricular ejection fraction, by estimation, is 70 to 75%. The left ventricle has hyperdynamic function. The left ventricle has no regional wall motion abnormalities. Left ventricular diastolic parameters were normal.  2. Right  ventricular systolic function is normal. The right ventricular size is normal. Tricuspid regurgitation signal is inadequate for assessing PA pressure.  3. The mitral valve is grossly normal. Trivial mitral valve regurgitation. No evidence of mitral stenosis.  4. The aortic valve is tricuspid. Aortic valve regurgitation is not visualized. No aortic stenosis is present.  5. The inferior vena cava is normal in size with <50% respiratory variability, suggesting right atrial pressure of 8 mmHg. Conclusion(s)/Recommendation(s): Normal biventricular function without evidence of hemodynamically significant valvular heart disease. FINDINGS  Left Ventricle: Left ventricular ejection fraction, by estimation, is 70 to 75%. The left ventricle has hyperdynamic function. The left ventricle has no regional wall motion abnormalities. The left ventricular internal cavity size was normal in size. There is no left ventricular hypertrophy. Left ventricular diastolic parameters were  normal. Right Ventricle: The right ventricular size is normal. No increase in right ventricular wall thickness. Right ventricular systolic function is normal. Tricuspid regurgitation signal is inadequate for assessing PA pressure. Left Atrium: Left atrial size was normal in size. Right Atrium: Right atrial size was normal in size. Pericardium: There is no evidence of pericardial effusion. Mitral Valve: The mitral valve is grossly normal. Trivial mitral valve regurgitation. No evidence of mitral valve stenosis. Tricuspid Valve: The tricuspid valve is grossly normal. Tricuspid valve regurgitation is trivial. No evidence of tricuspid stenosis. Aortic Valve: The aortic valve is tricuspid. Aortic valve regurgitation is not visualized. No aortic stenosis is present. Aortic valve mean gradient measures 6.0 mmHg. Aortic valve peak gradient measures 11.2 mmHg. Aortic valve area, by VTI measures 2.09  cm. Pulmonic Valve: The pulmonic valve was grossly normal. Pulmonic valve regurgitation is not visualized. No evidence of pulmonic stenosis. Aorta: The aortic root and ascending aorta are structurally normal, with no evidence of dilitation. Venous: The right lower pulmonary vein is normal. The inferior vena cava is normal in size with less than 50% respiratory variability, suggesting right atrial pressure of 8 mmHg. IAS/Shunts: The atrial septum is grossly normal.  LEFT VENTRICLE PLAX 2D LVIDd:         3.90 cm   Diastology LVIDs:         2.60 cm   LV e' medial:    8.38 cm/s LV PW:         0.90 cm   LV E/e' medial:  12.9 LV IVS:        0.90 cm   LV e' lateral:   9.25 cm/s LVOT diam:     1.80 cm   LV E/e' lateral: 11.7 LV SV:         53 LV SV Index:   34 LVOT Area:     2.54 cm  RIGHT VENTRICLE             IVC RV Basal diam:  3.30 cm     IVC diam: 1.80 cm RV S prime:     13.40 cm/s TAPSE (M-mode): 2.4 cm LEFT ATRIUM             Index        RIGHT ATRIUM           Index LA diam:        3.20 cm 2.04 cm/m   RA Area:     15.90 cm  LA Vol (A2C):   12.0 ml 7.67 ml/m   RA Volume:   39.50 ml  25.23 ml/m LA Vol (A4C):   27.4 ml 17.50 ml/m LA Biplane Vol: 18.9 ml 12.07 ml/m  AORTIC VALVE AV Area (Vmax):    2.20 cm AV Area (Vmean):   2.14 cm AV Area (VTI):     2.09 cm AV Vmax:           167.00 cm/s AV Vmean:          116.000 cm/s AV VTI:            0.255 m AV Peak Grad:      11.2 mmHg AV Mean Grad:      6.0 mmHg LVOT Vmax:         144.50 cm/s LVOT Vmean:        97.750 cm/s LVOT VTI:          0.209 m LVOT/AV VTI ratio: 0.82  AORTA Ao Root diam: 2.80 cm Ao Asc diam:  2.80 cm MITRAL VALVE MV Area (PHT): 5.31 cm     SHUNTS MV Decel Time: 143 msec     Systemic VTI:  0.21 m MV E velocity: 108.00 cm/s  Systemic Diam: 1.80 cm MV A velocity: 99.40 cm/s MV E/A ratio:  1.09 Eleonore Chiquito MD Electronically signed by Eleonore Chiquito MD Signature Date/Time: 11/19/2022/10:39:33 AM    Final    CT Chest Wo Contrast  Result Date: 11/18/2022 CLINICAL DATA:  Pneumonia.  History of IV drug use. EXAM: CT CHEST WITHOUT CONTRAST TECHNIQUE: Multidetector CT imaging of the chest was performed following the standard protocol without IV contrast. RADIATION DOSE REDUCTION: This exam was performed according to the departmental dose-optimization program which includes automated exposure control, adjustment of the mA and/or kV according to patient size and/or use of iterative reconstruction technique. COMPARISON:  Chest CTA 10/30/2022 FINDINGS: Cardiovascular: Normal caliber of the thoracic aorta. Normal heart size. No pericardial effusion. Mediastinum/Nodes: No enlarged axillary lymph nodes. Limited assessment for mediastinal and hilar lymphadenopathy due to the absence of IV contrast and paucity of mediastinal fat. Unremarkable included thyroid. Nondistended esophagus. Lungs/Pleura: New small left and trace right pleural effusions. No pneumothorax. Much of the patchy ground-glass and consolidative opacity on the prior CT has resolved, however there is new more extensive  consolidation in the left lower lobe, inferior lingula, and anteromedial left upper lobe. There also multiple new small peripheral nodular opacities in the upper lobes, right middle lobe, and right lower lobe, some of which demonstrate surrounding ground-glass density and with at least one demonstrating a small focus of central cavitation (series 6, image 48 in the right upper lobe). Upper Abdomen: No acute abnormality. Musculoskeletal: No acute osseous abnormality or suspicious osseous lesion. IMPRESSION: 1. Multifocal lung consolidation consistent with pneumonia, greatest in the left lower lobe and left upper lobe/lingula. Bilateral peripheral nodules (with at least 1 being cavitary) raise the possibility of septic emboli. 2. New small left and trace right pleural effusions. Electronically Signed   By: Logan Bores M.D.   On: 11/18/2022 14:33     Medical Consultants:   None.   Subjective:    Pernell Alles really she feels much better this morning.  Did not tolerate the table at the MRI machine because of her back.  Objective:    Vitals:   11/20/22 0100 11/20/22 0409 11/20/22 0800 11/20/22 0801  BP:  116/82 119/80   Pulse:  (!) 110 (!) 126 (!) 117  Resp: 13 19 12    Temp:      TempSrc:      SpO2:   (!) 86%   Weight:      Height:       SpO2: Marland Kitchen)  86 % O2 Flow Rate (L/min): 2 L/min   Intake/Output Summary (Last 24 hours) at 11/20/2022 1117 Last data filed at 11/20/2022 0420 Gross per 24 hour  Intake 360 ml  Output --  Net 360 ml    Filed Weights   11/18/22 0805  Weight: 56.7 kg    Exam: General exam: In no acute distress. Respiratory system: Good air movement and clear to auscultation. Cardiovascular system: S1 & S2 heard, RRR. No JVD. Gastrointestinal system: Abdomen is nondistended, soft and nontender.  Extremities: No pedal edema. Skin: No rashes, lesions or ulcers Psychiatry: Judgement and insight appear normal. Mood & affect appropriate. Data Reviewed:     Labs: Basic Metabolic Panel: Recent Labs  Lab 11/18/22 0828 11/19/22 0316 11/20/22 0220  NA 130* 134* 134*  K 3.7 3.1* 4.3  CL 97* 101 102  CO2 23 24 23   GLUCOSE 125* 149* 99  BUN 5* 7 9  CREATININE 0.59 0.67 0.60  CALCIUM 8.6* 8.0* 8.2*    GFR Estimated Creatinine Clearance: 80.6 mL/min (by C-G formula based on SCr of 0.6 mg/dL). Liver Function Tests: Recent Labs  Lab 11/19/22 0316  AST 19  ALT 42  ALKPHOS 162*  BILITOT 0.9  PROT 5.6*  ALBUMIN 2.3*    No results for input(s): "LIPASE", "AMYLASE" in the last 168 hours. No results for input(s): "AMMONIA" in the last 168 hours. Coagulation profile No results for input(s): "INR", "PROTIME" in the last 168 hours. COVID-19 Labs  No results for input(s): "DDIMER", "FERRITIN", "LDH", "CRP" in the last 72 hours.  Lab Results  Component Value Date   SARSCOV2NAA NEGATIVE 11/18/2022   SARSCOV2NAA NEGATIVE 03/23/2020    CBC: Recent Labs  Lab 11/18/22 0828 11/19/22 0316  WBC 15.6* 15.6*  HGB 12.6 11.2*  HCT 36.4 31.8*  MCV 87.3 85.9  PLT 216 194    Cardiac Enzymes: No results for input(s): "CKTOTAL", "CKMB", "CKMBINDEX", "TROPONINI" in the last 168 hours. BNP (last 3 results) No results for input(s): "PROBNP" in the last 8760 hours. CBG: No results for input(s): "GLUCAP" in the last 168 hours. D-Dimer: No results for input(s): "DDIMER" in the last 72 hours. Hgb A1c: No results for input(s): "HGBA1C" in the last 72 hours. Lipid Profile: No results for input(s): "CHOL", "HDL", "LDLCALC", "TRIG", "CHOLHDL", "LDLDIRECT" in the last 72 hours. Thyroid function studies: No results for input(s): "TSH", "T4TOTAL", "T3FREE", "THYROIDAB" in the last 72 hours.  Invalid input(s): "FREET3" Anemia work up: No results for input(s): "VITAMINB12", "FOLATE", "FERRITIN", "TIBC", "IRON", "RETICCTPCT" in the last 72 hours. Sepsis Labs: Recent Labs  Lab 11/18/22 0828 11/18/22 1303 11/19/22 0316  WBC 15.6*  --  15.6*   LATICACIDVEN  --  1.4  --     Microbiology Recent Results (from the past 240 hour(s))  Resp panel by RT-PCR (RSV, Flu A&B, Covid) Anterior Nasal Swab     Status: None   Collection Time: 11/18/22 10:08 AM   Specimen: Anterior Nasal Swab  Result Value Ref Range Status   SARS Coronavirus 2 by RT PCR NEGATIVE NEGATIVE Final    Comment: (NOTE) SARS-CoV-2 target nucleic acids are NOT DETECTED.  The SARS-CoV-2 RNA is generally detectable in upper respiratory specimens during the acute phase of infection. The lowest concentration of SARS-CoV-2 viral copies this assay can detect is 138 copies/mL. A negative result does not preclude SARS-Cov-2 infection and should not be used as the sole basis for treatment or other patient management decisions. A negative result may occur with  improper  specimen collection/handling, submission of specimen other than nasopharyngeal swab, presence of viral mutation(s) within the areas targeted by this assay, and inadequate number of viral copies(<138 copies/mL). A negative result must be combined with clinical observations, patient history, and epidemiological information. The expected result is Negative.  Fact Sheet for Patients:  BloggerCourse.com  Fact Sheet for Healthcare Providers:  SeriousBroker.it  This test is no t yet approved or cleared by the [place] FDA and  has been authorized for detection and/or diagnosis of SARS-CoV-2 by FDA under an Emergency Use Authorization (EUA). This EUA will remain  in effect (meaning this test can be used) for the duration of the COVID-19 declaration under Section 564(b)(1) of the Act, 21 U.S.C.section 360bbb-3(b)(1), unless the authorization is terminated  or revoked sooner.       Influenza A by PCR NEGATIVE NEGATIVE Final   Influenza B by PCR NEGATIVE NEGATIVE Final    Comment: (NOTE) The Xpert Xpress SARS-CoV-2/FLU/RSV plus assay is intended as an  aid in the diagnosis of influenza from Nasopharyngeal swab specimens and should not be used as a sole basis for treatment. Nasal washings and aspirates are unacceptable for Xpert Xpress SARS-CoV-2/FLU/RSV testing.  Fact Sheet for Patients: BloggerCourse.com  Fact Sheet for Healthcare Providers: SeriousBroker.it  This test is not yet approved or cleared by the [place] FDA and has been authorized for detection and/or diagnosis of SARS-CoV-2 by FDA under an Emergency Use Authorization (EUA). This EUA will remain in effect (meaning this test can be used) for the duration of the COVID-19 declaration under Section 564(b)(1) of the Act, 21 U.S.C. section 360bbb-3(b)(1), unless the authorization is terminated or revoked.     Resp Syncytial Virus by PCR NEGATIVE NEGATIVE Final    Comment: (NOTE) Fact Sheet for Patients: BloggerCourse.com  Fact Sheet for Healthcare Providers: SeriousBroker.it  This test is not yet approved or cleared by the [place] FDA and has been authorized for detection and/or diagnosis of SARS-CoV-2 by FDA under an Emergency Use Authorization (EUA). This EUA will remain in effect (meaning this test can be used) for the duration of the COVID-19 declaration under Section 564(b)(1) of the Act, 21 U.S.C. section 360bbb-3(b)(1), unless the authorization is terminated or revoked.  Performed at [facility], [address], [place], [place] [address]   Blood culture (routine x 2)     Status: Abnormal (Preliminary result)   Collection Time: 11/18/22 12:07 PM   Specimen: BLOOD  Result Value Ref Range Status   Specimen Description BLOOD SITE NOT SPECIFIED  Final   Special Requests   Final    BOTTLES DRAWN AEROBIC AND ANAEROBIC Blood Culture adequate volume   Culture  Setup Time   Final    GRAM POSITIVE COCCI IN BOTH AEROBIC AND ANAEROBIC  BOTTLES CRITICAL RESULT CALLED TO, READ BACK BY AND VERIFIED WITH: PHARMD G. ABBOTT 11/19/22 @ 338 BY AB    Culture (A)  Final    STAPHYLOCOCCUS AUREUS SUSCEPTIBILITIES TO FOLLOW Performed at [facility], [address] [place], [place] [address]    Report Status PENDING  Incomplete  Blood Culture ID Panel (Reflexed)     Status: Abnormal   Collection Time: 11/18/22 12:07 PM  Result Value Ref Range Status   Enterococcus faecalis NOT DETECTED NOT DETECTED Final   Enterococcus Faecium NOT DETECTED NOT DETECTED Final   Listeria monocytogenes NOT DETECTED NOT DETECTED Final   Staphylococcus species DETECTED (A) NOT DETECTED Final    Comment: CRITICAL RESULT CALLED  TO, READ BACK BY AND VERIFIED WITH: PHARMD G. ABBOTT 11/19/22 @ 338 BY AB    Staphylococcus aureus (BCID) DETECTED (A) NOT DETECTED Final    Comment: Methicillin (oxacillin)-resistant Staphylococcus aureus (MRSA). MRSA is predictably resistant to beta-lactam antibiotics (except ceftaroline). Preferred therapy is vancomycin unless clinically contraindicated. Patient requires contact precautions if  hospitalized. CRITICAL RESULT CALLED TO, READ BACK BY AND VERIFIED WITH: PHARMD G. ABBOTT 11/19/22 @ 338 BY AB    Staphylococcus epidermidis NOT DETECTED NOT DETECTED Final   Staphylococcus lugdunensis NOT DETECTED NOT DETECTED Final   Streptococcus species NOT DETECTED NOT DETECTED Final   Streptococcus agalactiae NOT DETECTED NOT DETECTED Final   Streptococcus pneumoniae NOT DETECTED NOT DETECTED Final   Streptococcus pyogenes NOT DETECTED NOT DETECTED Final   A.calcoaceticus-baumannii NOT DETECTED NOT DETECTED Final   Bacteroides fragilis NOT DETECTED NOT DETECTED Final   Enterobacterales NOT DETECTED NOT DETECTED Final   Enterobacter cloacae complex NOT DETECTED NOT DETECTED Final   Escherichia coli NOT DETECTED NOT DETECTED Final   Klebsiella aerogenes NOT DETECTED NOT DETECTED Final   Klebsiella oxytoca NOT DETECTED NOT  DETECTED Final   Klebsiella pneumoniae NOT DETECTED NOT DETECTED Final   Proteus species NOT DETECTED NOT DETECTED Final   Salmonella species NOT DETECTED NOT DETECTED Final   Serratia marcescens NOT DETECTED NOT DETECTED Final   Haemophilus influenzae NOT DETECTED NOT DETECTED Final   Neisseria meningitidis NOT DETECTED NOT DETECTED Final   Pseudomonas aeruginosa NOT DETECTED NOT DETECTED Final   Stenotrophomonas maltophilia NOT DETECTED NOT DETECTED Final   Candida albicans NOT DETECTED NOT DETECTED Final   Candida auris NOT DETECTED NOT DETECTED Final   Candida glabrata NOT DETECTED NOT DETECTED Final   Candida krusei NOT DETECTED NOT DETECTED Final   Candida parapsilosis NOT DETECTED NOT DETECTED Final   Candida tropicalis NOT DETECTED NOT DETECTED Final   Cryptococcus neoformans/gattii NOT DETECTED NOT DETECTED Final   Meth resistant mecA/C and MREJ DETECTED (A) NOT DETECTED Final    Comment: CRITICAL RESULT CALLED TO, READ BACK BY AND VERIFIED WITH: PHARMD G. ABBOTT 11/19/22 @ 338 BY AB Performed at [facility], [address] [place], [place] [address]   Blood culture (routine x 2)     Status: None (Preliminary result)   Collection Time: 11/19/22  3:16 AM   Specimen: BLOOD  Result Value Ref Range Status   Specimen Description BLOOD BLOOD LEFT HAND  Final   Special Requests   Final    BOTTLES DRAWN AEROBIC AND ANAEROBIC Blood Culture adequate volume   Culture   Final    NO GROWTH 1 DAY Performed at [facility], [address] [place], [place] [address]    Report Status PENDING  Incomplete     Medications:    enoxaparin (LOVENOX) injection  40 mg Subcutaneous Q24H   metoprolol tartrate  25 mg Oral BID   morphine  30 mg Oral Q12H   polyethylene glycol  17 g Oral BID   Continuous Infusions:  sodium chloride 75 mL/hr at 11/20/22 0408   vancomycin 750 mg (11/20/22 0933)      LOS: 2 days   Marinda Elk  [facility]  11/20/2022, 11:17  AM

## 2022-11-20 NOTE — Progress Notes (Signed)
Pt has pending MRI and in need of once dose Fentanyl 50 mcg IV prior to receiving procedure ordered by Aileen Fass, MD as a scheduled medication for 1215 11/20/2022. Edison Nasuti of MRI stated that RN will be called prior to transport for pt. Aileen Fass, MD and Desiray, RN aware of situation.

## 2022-11-21 ENCOUNTER — Inpatient Hospital Stay (HOSPITAL_COMMUNITY): Payer: Medicaid Other

## 2022-11-21 DIAGNOSIS — B9562 Methicillin resistant Staphylococcus aureus infection as the cause of diseases classified elsewhere: Secondary | ICD-10-CM

## 2022-11-21 DIAGNOSIS — R7881 Bacteremia: Secondary | ICD-10-CM

## 2022-11-21 LAB — CULTURE, BLOOD (ROUTINE X 2)
Special Requests: ADEQUATE
Special Requests: ADEQUATE

## 2022-11-21 MED ORDER — HYDROMORPHONE HCL 1 MG/ML IJ SOLN
2.0000 mg | INTRAMUSCULAR | Status: AC | PRN
  Administered 2022-11-21 (×2): 2 mg via INTRAVENOUS
  Filled 2022-11-21 (×2): qty 2

## 2022-11-21 NOTE — Progress Notes (Signed)
Id brief update  32 yo female with community acquired mrsa bsi in setting ivdu, and left LE ssi Course with multifocal opacity probably septic emboli; no PE  Awaiting source w/u (tee, mri left shoulder and left thigh)  Still fever Repeat bcx 1/13 positive   -primary team had sent repeat blood cx -await tee and mri -continue vanc per pharmacy protocol -if repeat bcx continued to be positive, which is likely due to lack of source control at this time, it might be worthwhile also to start daptomycin/ceftaroline combination -id will continue to follow

## 2022-11-21 NOTE — Progress Notes (Signed)
Several attempts have been made to obtain pt's currently ordered MRI. Pt has ended up refusing all times. Orders cancelled per ordering doc.

## 2022-11-21 NOTE — Progress Notes (Signed)
Pharmacy Antibiotic Note  Cassandra Roberts is a 32 y.o. female admitted on 11/18/2022 who has a history of IVDU with MRSA bacteremia identified by rapid fire BCID panel. Pharmacy was consulted for vancomycin dosing on 1/11. She is febrile today with an Scr at baseline of 0.6.   ID is following - concern for septic emboli present but no PE. Continued workup includes an MRI of the left shoulder and left thigh and a TEE.   Plan: Continue Vancomycin 750 mg IV q12h; est AUC of 515; IBW; 0.72 Vd, round to 0.8 for Scr  Continue to monitor clinical progress Follow up ID recommendations   Height: 5\' 2"  (157.5 cm) Weight: 56.7 kg (125 lb) IBW/kg (Calculated) : 50.1  Temp (24hrs), Avg:99.9 F (37.7 C), Min:99 F (37.2 C), Max:101.1 F (38.4 C)  Recent Labs  Lab 11/18/22 0828 11/18/22 1303 11/19/22 0316 11/20/22 0220  WBC 15.6*  --  15.6*  --   CREATININE 0.59  --  0.67 0.60  LATICACIDVEN  --  1.4  --   --      Estimated Creatinine Clearance: 80.6 mL/min (by C-G formula based on SCr of 0.6 mg/dL).    Allergies  Allergen Reactions   Dextromethorphan     Shaking    Antimicrobials this admission: cefepime 1/11  vancomycin 1/11 >>   Microbiology results: 1/11 BCID: MRSA 4/6 bottles  1/11 respiratory panel: negative   Thank you for allowing pharmacy to be a part of this patient's care.  Vicenta Dunning, PharmD  PGY1 Pharmacy Resident

## 2022-11-21 NOTE — Progress Notes (Signed)
[facility] PROGRESS NOTE    Progress Note  Cassandra Roberts  [id number] DOB: Jul 02, 1991 DOA: 11/18/2022 PCP: Patient, No Pcp Per     Brief Narrative:   Cassandra Roberts is an 32 y.o. 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   MRI of the left shoulder and left hip and lumbar spine is pending ID was consulted recommended TEE on Tuesday. Surveillance blood cultures continue to be positive. Repeat blood cultures today 11/21/2022. Has defervesce. She did not complete MRI due to pain we will give her single dose of IV fentanyl right before MRI.  Polysubstance abuse (HCC)/ IVDU (intravenous drug user) Continue on MS Contin.    DVT prophylaxis: lovenox Family Communication:none Status is: Inpatient Remains inpatient appropriate because: MRSA bacteremia    Code Status:     Code Status Orders  (From admission, onward)           Start     Ordered   11/18/22 1623  Full code  Continuous       Question:  By:  Answer:  Consent: discussion documented in EHR   11/18/22 1625           Code Status History     Date Active Date Inactive Code Status Order ID Comments User Context   03/23/2020 1622 03/25/2020 0102 Full Code [id number]  Charlann Lange, MD Inpatient   08/21/2013 1049 08/21/2013 2156 Full Code [id number]  Dewaine Oats, PA-C ED         IV Access:   Peripheral  IV   Procedures and diagnostic studies:   No results found.   Medical Consultants:   None.   Subjective:    Cassandra Roberts continues to relate she continues to improve.  Objective:    Vitals:   11/21/22 0000 11/21/22 0308 11/21/22 0400 11/21/22 0822  BP: 120/84 (!) 131/95 127/84 125/78  Pulse: 88 100 (!) 108 (!) 118  Resp: (!) 24 18 (!) 24   Temp:  99.5 F (37.5 C)    TempSrc:  Oral    SpO2: 95% 93% 93%   Weight:      Height:       SpO2: 93 % O2 Flow Rate (L/min): 2 L/min   Intake/Output Summary (Last 24 hours) at 11/21/2022 0929 Last data filed at 11/20/2022 2047 Gross per 24 hour  Intake 150 ml  Output 500 ml  Net -350 ml    Filed Weights   11/18/22 0805  Weight: 56.7 kg    Exam: General exam: In no acute distress. Respiratory system: Good air movement and clear to auscultation. Cardiovascular system: S1 & S2 heard, RRR. No JVD. Gastrointestinal system: Abdomen is nondistended, soft and nontender.  Extremities: No pedal edema. Skin: No rashes, lesions or ulcers Psychiatry: Judgement and insight appear normal. Mood & affect appropriate. Data Reviewed:    Labs: Basic Metabolic Panel: Recent Labs  Lab 11/18/22 0828 11/19/22 0316 11/20/22 0220  NA 130* 134* 134*  K 3.7  3.1* 4.3  CL 97* 101 102  CO2 23 24 23   GLUCOSE 125* 149* 99  BUN 5* 7 9  CREATININE 0.59 0.67 0.60  CALCIUM 8.6* 8.0* 8.2*    GFR Estimated Creatinine Clearance: 80.6 mL/min (by C-G formula based on SCr of 0.6 mg/dL). Liver Function Tests: Recent Labs  Lab 11/19/22 0316  AST 19  ALT 42  ALKPHOS 162*  BILITOT 0.9  PROT 5.6*  ALBUMIN 2.3*    No results for input(s): "LIPASE", "AMYLASE" in the last 168 hours. No results for input(s): "AMMONIA" in the last 168 hours. Coagulation profile No results for input(s): "INR", "PROTIME" in the last 168 hours. COVID-19 Labs  No results for input(s): "DDIMER", "FERRITIN", "LDH", "CRP" in the last 72 hours.  Lab Results   Component Value Date   SARSCOV2NAA NEGATIVE 11/18/2022   SARSCOV2NAA NEGATIVE 03/23/2020    CBC: Recent Labs  Lab 11/18/22 0828 11/19/22 0316  WBC 15.6* 15.6*  HGB 12.6 11.2*  HCT 36.4 31.8*  MCV 87.3 85.9  PLT 216 194    Cardiac Enzymes: No results for input(s): "CKTOTAL", "CKMB", "CKMBINDEX", "TROPONINI" in the last 168 hours. BNP (last 3 results) No results for input(s): "PROBNP" in the last 8760 hours. CBG: No results for input(s): "GLUCAP" in the last 168 hours. D-Dimer: No results for input(s): "DDIMER" in the last 72 hours. Hgb A1c: No results for input(s): "HGBA1C" in the last 72 hours. Lipid Profile: No results for input(s): "CHOL", "HDL", "LDLCALC", "TRIG", "CHOLHDL", "LDLDIRECT" in the last 72 hours. Thyroid function studies: No results for input(s): "TSH", "T4TOTAL", "T3FREE", "THYROIDAB" in the last 72 hours.  Invalid input(s): "FREET3" Anemia work up: No results for input(s): "VITAMINB12", "FOLATE", "FERRITIN", "TIBC", "IRON", "RETICCTPCT" in the last 72 hours. Sepsis Labs: Recent Labs  Lab 11/18/22 0828 11/18/22 1303 11/19/22 0316  WBC 15.6*  --  15.6*  LATICACIDVEN  --  1.4  --     Microbiology Recent Results (from the past 240 hour(s))  Resp panel by RT-PCR (RSV, Flu A&B, Covid) Anterior Nasal Swab     Status: None   Collection Time: 11/18/22 10:08 AM   Specimen: Anterior Nasal Swab  Result Value Ref Range Status   SARS Coronavirus 2 by RT PCR NEGATIVE NEGATIVE Final    Comment: (NOTE) SARS-CoV-2 target nucleic acids are NOT DETECTED.  The SARS-CoV-2 RNA is generally detectable in upper respiratory specimens during the acute phase of infection. The lowest concentration of SARS-CoV-2 viral copies this assay can detect is 138 copies/mL. A negative result does not preclude SARS-Cov-2 infection and should not be used as the sole basis for treatment or other patient management decisions. A negative result may occur with  improper specimen  collection/handling, submission of specimen other than nasopharyngeal swab, presence of viral mutation(s) within the areas targeted by this assay, and inadequate number of viral copies(<138 copies/mL). A negative result must be combined with clinical observations, patient history, and epidemiological information. The expected result is Negative.  Fact Sheet for Patients:  01/17/23  Fact Sheet for Healthcare Providers:  BloggerCourse.com  This test is no t yet approved or cleared by the [employer] FDA and  has been authorized for detection and/or diagnosis of SARS-CoV-2 by FDA under an Emergency Use Authorization (EUA). This EUA will remain  in effect (meaning this test can be used) for the duration of the COVID-19 declaration under Section 564(b)(1) of the Act, 21 U.S.C.section 360bbb-3(b)(1), unless the authorization is terminated  or revoked sooner.  Influenza A by PCR NEGATIVE NEGATIVE Final   Influenza B by PCR NEGATIVE NEGATIVE Final    Comment: (NOTE) The Xpert Xpress SARS-CoV-2/FLU/RSV plus assay is intended as an aid in the diagnosis of influenza from Nasopharyngeal swab specimens and should not be used as a sole basis for treatment. Nasal washings and aspirates are unacceptable for Xpert Xpress SARS-CoV-2/FLU/RSV testing.  Fact Sheet for Patients: EntrepreneurPulse.com.au  Fact Sheet for Healthcare Providers: IncredibleEmployment.be  This test is not yet approved or cleared by the [place] FDA and has been authorized for detection and/or diagnosis of SARS-CoV-2 by FDA under an Emergency Use Authorization (EUA). This EUA will remain in effect (meaning this test can be used) for the duration of the COVID-19 declaration under Section 564(b)(1) of the Act, 21 U.S.C. section 360bbb-3(b)(1), unless the authorization is terminated or revoked.     Resp Syncytial  Virus by PCR NEGATIVE NEGATIVE Final    Comment: (NOTE) Fact Sheet for Patients: EntrepreneurPulse.com.au  Fact Sheet for Healthcare Providers: IncredibleEmployment.be  This test is not yet approved or cleared by the [place] FDA and has been authorized for detection and/or diagnosis of SARS-CoV-2 by FDA under an Emergency Use Authorization (EUA). This EUA will remain in effect (meaning this test can be used) for the duration of the COVID-19 declaration under Section 564(b)(1) of the Act, 21 U.S.C. section 360bbb-3(b)(1), unless the authorization is terminated or revoked.  Performed at [facility], [place] [address] [place], [place] [address]   Blood culture (routine x 2)     Status: Abnormal   Collection Time: 11/18/22 12:07 PM   Specimen: BLOOD  Result Value Ref Range Status   Specimen Description BLOOD SITE NOT SPECIFIED  Final   Special Requests   Final    BOTTLES DRAWN AEROBIC AND ANAEROBIC Blood Culture adequate volume   Culture  Setup Time   Final    GRAM POSITIVE COCCI IN BOTH AEROBIC AND ANAEROBIC BOTTLES CRITICAL RESULT CALLED TO, READ BACK BY AND VERIFIED WITH: PHARMD G. ABBOTT 11/19/22 @ 338 BY AB Performed at [facility], [place] [address] [place], [place] [address]    Culture METHICILLIN RESISTANT STAPHYLOCOCCUS AUREUS (A)  Final   Report Status 11/21/2022 FINAL  Final   Organism ID, Bacteria METHICILLIN RESISTANT STAPHYLOCOCCUS AUREUS  Final      Susceptibility   Methicillin resistant staphylococcus aureus - MIC*    CIPROFLOXACIN >=8 RESISTANT Resistant     ERYTHROMYCIN >=8 RESISTANT Resistant     GENTAMICIN <=0.5 SENSITIVE Sensitive     OXACILLIN >=4 RESISTANT Resistant     TETRACYCLINE <=1 SENSITIVE Sensitive     VANCOMYCIN <=0.5 SENSITIVE Sensitive     TRIMETH/SULFA <=10 SENSITIVE Sensitive     CLINDAMYCIN >=8 RESISTANT Resistant     RIFAMPIN <=0.5 SENSITIVE Sensitive     Inducible Clindamycin NEGATIVE  Sensitive     * METHICILLIN RESISTANT STAPHYLOCOCCUS AUREUS  Blood Culture ID Panel (Reflexed)     Status: Abnormal   Collection Time: 11/18/22 12:07 PM  Result Value Ref Range Status   Enterococcus faecalis NOT DETECTED NOT DETECTED Final   Enterococcus Faecium NOT DETECTED NOT DETECTED Final   Listeria monocytogenes NOT DETECTED NOT DETECTED Final   Staphylococcus species DETECTED (A) NOT DETECTED Final    Comment: CRITICAL RESULT CALLED TO, READ BACK BY AND VERIFIED WITH: PHARMD G. ABBOTT 11/19/22 @ 338 BY AB    Staphylococcus aureus (BCID) DETECTED (A) NOT DETECTED Final    Comment: Methicillin (oxacillin)-resistant  Staphylococcus aureus (MRSA). MRSA is predictably resistant to beta-lactam antibiotics (except ceftaroline). Preferred therapy is vancomycin unless clinically contraindicated. Patient requires contact precautions if  hospitalized. CRITICAL RESULT CALLED TO, READ BACK BY AND VERIFIED WITH: PHARMD G. ABBOTT 11/19/22 @ 338 BY AB    Staphylococcus epidermidis NOT DETECTED NOT DETECTED Final   Staphylococcus lugdunensis NOT DETECTED NOT DETECTED Final   Streptococcus species NOT DETECTED NOT DETECTED Final   Streptococcus agalactiae NOT DETECTED NOT DETECTED Final   Streptococcus pneumoniae NOT DETECTED NOT DETECTED Final   Streptococcus pyogenes NOT DETECTED NOT DETECTED Final   A.calcoaceticus-baumannii NOT DETECTED NOT DETECTED Final   Bacteroides fragilis NOT DETECTED NOT DETECTED Final   Enterobacterales NOT DETECTED NOT DETECTED Final   Enterobacter cloacae complex NOT DETECTED NOT DETECTED Final   Escherichia coli NOT DETECTED NOT DETECTED Final   Klebsiella aerogenes NOT DETECTED NOT DETECTED Final   Klebsiella oxytoca NOT DETECTED NOT DETECTED Final   Klebsiella pneumoniae NOT DETECTED NOT DETECTED Final   Proteus species NOT DETECTED NOT DETECTED Final   Salmonella species NOT DETECTED NOT DETECTED Final   Serratia marcescens NOT DETECTED NOT DETECTED Final    Haemophilus influenzae NOT DETECTED NOT DETECTED Final   Neisseria meningitidis NOT DETECTED NOT DETECTED Final   Pseudomonas aeruginosa NOT DETECTED NOT DETECTED Final   Stenotrophomonas maltophilia NOT DETECTED NOT DETECTED Final   Candida albicans NOT DETECTED NOT DETECTED Final   Candida auris NOT DETECTED NOT DETECTED Final   Candida glabrata NOT DETECTED NOT DETECTED Final   Candida krusei NOT DETECTED NOT DETECTED Final   Candida parapsilosis NOT DETECTED NOT DETECTED Final   Candida tropicalis NOT DETECTED NOT DETECTED Final   Cryptococcus neoformans/gattii NOT DETECTED NOT DETECTED Final   Meth resistant mecA/C and MREJ DETECTED (A) NOT DETECTED Final    Comment: CRITICAL RESULT CALLED TO, READ BACK BY AND VERIFIED WITH: PHARMD G. ABBOTT 11/19/22 @ 338 BY AB Performed at [facility], [address] [place], [place] [address]   Blood culture (routine x 2)     Status: None (Preliminary result)   Collection Time: 11/19/22  3:16 AM   Specimen: BLOOD  Result Value Ref Range Status   Specimen Description BLOOD BLOOD LEFT HAND  Final   Special Requests   Final    BOTTLES DRAWN AEROBIC AND ANAEROBIC Blood Culture adequate volume   Culture   Final    NO GROWTH 2 DAYS Performed at [facility], [place] [address] [place], [place] [address]    Report Status PENDING  Incomplete  Culture, blood (Routine X 2) w Reflex to ID Panel     Status: Abnormal   Collection Time: 11/20/22  2:20 AM   Specimen: BLOOD LEFT HAND  Result Value Ref Range Status   Specimen Description BLOOD LEFT HAND  Final   Special Requests IN PEDIATRIC BOTTLE Blood Culture adequate volume  Final   Culture  Setup Time   Final    AEROBIC BOTTLE ONLY GRAM POSITIVE COCCI IN CLUSTERS CRITICAL VALUE NOTED.  VALUE IS CONSISTENT WITH PREVIOUSLY REPORTED AND CALLED VALUE.    Culture (A)  Final    STAPHYLOCOCCUS AUREUS SUSCEPTIBILITIES PERFORMED ON PREVIOUS CULTURE WITHIN THE LAST 5 DAYS. Performed at [facility], [place] [address], [place], [place] [address]    Report Status 11/21/2022 FINAL  Final  Culture, blood (Routine X 2) w Reflex to ID Panel     Status: Abnormal   Collection Time: 11/20/22  6:09 AM   Specimen: BLOOD LEFT HAND  Result Value Ref Range Status   Specimen Description BLOOD LEFT HAND  Final   Special Requests   Final    BOTTLES DRAWN AEROBIC ONLY Blood Culture results may not be optimal due to an inadequate volume of blood received in culture bottles   Culture  Setup Time   Final    AEROBIC BOTTLE ONLY GRAM POSITIVE COCCI IN CLUSTERS CRITICAL VALUE NOTED.  VALUE IS CONSISTENT WITH PREVIOUSLY REPORTED AND CALLED VALUE.    Culture (A)  Final    STAPHYLOCOCCUS AUREUS SUSCEPTIBILITIES PERFORMED ON PREVIOUS CULTURE WITHIN THE LAST 5 DAYS. Performed at [facility], [place] [address] [place], [place] [address]    Report Status 11/21/2022 FINAL  Final     Medications:    enoxaparin (LOVENOX) injection  40 mg Subcutaneous Q24H   metoprolol tartrate  25 mg Oral BID   morphine  30 mg Oral Q12H   Continuous Infusions:  vancomycin 750 mg (11/20/22 2047)      LOS: 3 days   Charlynne Cousins  [facility]  11/21/2022, 9:29 AM

## 2022-11-22 ENCOUNTER — Inpatient Hospital Stay (HOSPITAL_COMMUNITY): Payer: Medicaid Other

## 2022-11-22 LAB — HCV RNA QUANT
HCV Quantitative Log: 4.851 log10 IU/mL (ref >1.70)
HCV Quantitative: 71000 IU/mL (ref >50)

## 2022-11-22 LAB — VANCOMYCIN, PEAK: Vancomycin Pk: 10 ug/mL — ABNORMAL LOW (ref 30–40)

## 2022-11-22 MED ORDER — VANCOMYCIN HCL IN DEXTROSE 1-5 GM/200ML-% IV SOLN
1000.0000 mg | Freq: Three times a day (TID) | INTRAVENOUS | Status: DC
  Administered 2022-11-22 – 2022-11-23 (×3): 1000 mg via INTRAVENOUS
  Filled 2022-11-22 (×3): qty 200

## 2022-11-22 MED ORDER — FENTANYL CITRATE PF 50 MCG/ML IJ SOSY
12.5000 ug | PREFILLED_SYRINGE | Freq: Once | INTRAMUSCULAR | Status: AC | PRN
  Administered 2022-11-22: 12.5 ug via INTRAVENOUS
  Filled 2022-11-22: qty 1

## 2022-11-22 MED ORDER — FENTANYL CITRATE PF 50 MCG/ML IJ SOSY
25.0000 ug | PREFILLED_SYRINGE | Freq: Once | INTRAMUSCULAR | Status: AC | PRN
  Administered 2022-11-22: 25 ug via INTRAVENOUS
  Filled 2022-11-22: qty 1

## 2022-11-22 NOTE — Progress Notes (Addendum)
[facility] Infectious Diseases Follow Up Note  Patient Identification: Patient Name: Cassandra Roberts MRN: [id number] Admit Date: 11/18/2022  8:00 AM Age: 32 y.o.Today's Date: 11/22/2022  Reason for Visit: MRSA bacteremia   Principal Problem:   MRSA bacteremia Active Problems:   Polysubstance abuse (HCC)   IVDU (intravenous drug user)   CAP (community acquired pneumonia)   Septic embolism (HCC)   Sepsis (HCC)   Sinus tachycardia   Cellulitis of left foot   Multifocal pneumonia   Staphylococcal arthritis of left shoulder (HCC)   Chronic hepatitis C with hepatic coma (HCC)   Pyomyositis   Antibiotics: Vancomycin 1/11-c Total days of abtx d 5   Lines/Hardwares:   Interval Events: Tmax 108.8 in the last 24 hours   Assessment 32 year old female with history of IVDU and polysubstance abuse was initially seen at [facility] on 12/23 for multifocal pneumonia and presented to [facility] ED for worsening cough and left lower extremity cellulitis.  Admitted with  # MRSA bacteremia with possible pulmonary septic emboli with at least with cavity - no spinal tenderness at CTL spine   # Hepatitis C - management OP  Recommendations On Vancomcyin and will check levels to confirm therapeutic levels. If optimal therapeutic range, will plan for combination tx with Daptomycin and ceftaroline.   Will follow MRI left shoulder as well as left femur  She may need TEE if continues to have persistent bacteremia to evaluate for complications of endocarditis like fistula, abscess, etc  Monitor for metastatic sites of infection  Monitor CBC, BMP and Vancomycin trough  Following   Rest of the management as per the primary team. Thank you for the consult. Please page with pertinent questions or concerns.  ______________________________________________________________________ Subjective patient seen and examined at the bedside. She asked me to let  her sleep initially but later allowed me to come in for quick exam   Past Medical History:  Diagnosis Date   Attention deficit disorder (ADD)    Hepatitis C    Substance abuse (HCC)     Past Surgical History:  Procedure Laterality Date   APPENDECTOMY     MINOR IRRIGATION AND DEBRIDEMENT OF WOUND Left 03/23/2020   Procedure: MINOR IRRIGATION AND DEBRIDEMENT OF WOUND;  Surgeon: Kathryne HitchBlackman, Christopher Y, MD;  Location: [facility];  Service: Orthopedics;  Laterality: Left;   Vitals BP 126/85 (BP Location: Left Arm)   Pulse 81   Temp 99.9 F (37.7 C) (Axillary)   Resp 19   Ht 5\' 2"  (1.575 m)   Wt 56.7 kg   LMP 11/04/2022   SpO2 96%   BMI 22.86 kg/m     Physical Exam Constitutional:  she is sleeping     Comments:   Cardiovascular:     Rate and Rhythm: Normal rate and regular rhythm.     Heart sounds: s1s2,  Pulmonary:     Effort: Pulmonary effort is normal on room air    Comments: bilateral coarse breath sounds   Abdominal:     Palpations: Abdomen is soft.     Tenderness: non distended and non tender   Musculoskeletal:        General: mild tenderness at the left shoulder and left thigh. Do not have ful blown signs of septic arthritis/abscess. No CTL spine tenderness   Skin:    Comments: multiple tattoos  Neurological:     General:awake, alert and oriented, follows commands   Psychiatric:        Mood and Affect: irritable and hesitant to  talk   Pertinent Microbiology Results for orders placed or performed during the hospital encounter of 11/18/22  Resp panel by RT-PCR (RSV, Flu A&B, Covid) Anterior Nasal Swab     Status: None   Collection Time: 11/18/22 10:08 AM   Specimen: Anterior Nasal Swab  Result Value Ref Range Status   SARS Coronavirus 2 by RT PCR NEGATIVE NEGATIVE Final    Comment: (NOTE) SARS-CoV-2 target nucleic acids are NOT DETECTED.  The SARS-CoV-2 RNA is generally detectable in upper respiratory specimens during the acute phase of infection. The  lowest concentration of SARS-CoV-2 viral copies this assay can detect is 138 copies/mL. A negative result does not preclude SARS-Cov-2 infection and should not be used as the sole basis for treatment or other patient management decisions. A negative result may occur with  improper specimen collection/handling, submission of specimen other than nasopharyngeal swab, presence of viral mutation(s) within the areas targeted by this assay, and inadequate number of viral copies(<138 copies/mL). A negative result must be combined with clinical observations, patient history, and epidemiological information. The expected result is Negative.  Fact Sheet for Patients:  BloggerCourse.com  Fact Sheet for Healthcare Providers:  SeriousBroker.it  This test is no t yet approved or cleared by the [place] FDA and  has been authorized for detection and/or diagnosis of SARS-CoV-2 by FDA under an Emergency Use Authorization (EUA). This EUA will remain  in effect (meaning this test can be used) for the duration of the COVID-19 declaration under Section 564(b)(1) of the Act, 21 U.S.C.section 360bbb-3(b)(1), unless the authorization is terminated  or revoked sooner.       Influenza A by PCR NEGATIVE NEGATIVE Final   Influenza B by PCR NEGATIVE NEGATIVE Final    Comment: (NOTE) The Xpert Xpress SARS-CoV-2/FLU/RSV plus assay is intended as an aid in the diagnosis of influenza from Nasopharyngeal swab specimens and should not be used as a sole basis for treatment. Nasal washings and aspirates are unacceptable for Xpert Xpress SARS-CoV-2/FLU/RSV testing.  Fact Sheet for Patients: BloggerCourse.com  Fact Sheet for Healthcare Providers: SeriousBroker.it  This test is not yet approved or cleared by the [place] FDA and has been authorized for detection and/or diagnosis of SARS-CoV-2 by FDA under  an Emergency Use Authorization (EUA). This EUA will remain in effect (meaning this test can be used) for the duration of the COVID-19 declaration under Section 564(b)(1) of the Act, 21 U.S.C. section 360bbb-3(b)(1), unless the authorization is terminated or revoked.     Resp Syncytial Virus by PCR NEGATIVE NEGATIVE Final    Comment: (NOTE) Fact Sheet for Patients: BloggerCourse.com  Fact Sheet for Healthcare Providers: SeriousBroker.it  This test is not yet approved or cleared by the [place] FDA and has been authorized for detection and/or diagnosis of SARS-CoV-2 by FDA under an Emergency Use Authorization (EUA). This EUA will remain in effect (meaning this test can be used) for the duration of the COVID-19 declaration under Section 564(b)(1) of the Act, 21 U.S.C. section 360bbb-3(b)(1), unless the authorization is terminated or revoked.  Performed at [facility], [address] [place], [place] [address]   Blood culture (routine x 2)     Status: Abnormal   Collection Time: 11/18/22 12:07 PM   Specimen: BLOOD  Result Value Ref Range Status   Specimen Description BLOOD SITE NOT SPECIFIED  Final   Special Requests   Final    BOTTLES DRAWN AEROBIC AND ANAEROBIC Blood Culture adequate volume   Culture  Setup  Time   Final    GRAM POSITIVE COCCI IN BOTH AEROBIC AND ANAEROBIC BOTTLES CRITICAL RESULT CALLED TO, READ BACK BY AND VERIFIED WITH: PHARMD G. ABBOTT 11/19/22 @ 338 BY AB Performed at [facility], [address] [place], [place] [address]    Culture METHICILLIN RESISTANT STAPHYLOCOCCUS AUREUS (A)  Final   Report Status 11/21/2022 FINAL  Final   Organism ID, Bacteria METHICILLIN RESISTANT STAPHYLOCOCCUS AUREUS  Final      Susceptibility   Methicillin resistant staphylococcus aureus - MIC*    CIPROFLOXACIN >=8 RESISTANT Resistant     ERYTHROMYCIN >=8 RESISTANT Resistant     GENTAMICIN <=0.5 SENSITIVE  Sensitive     OXACILLIN >=4 RESISTANT Resistant     TETRACYCLINE <=1 SENSITIVE Sensitive     VANCOMYCIN <=0.5 SENSITIVE Sensitive     TRIMETH/SULFA <=10 SENSITIVE Sensitive     CLINDAMYCIN >=8 RESISTANT Resistant     RIFAMPIN <=0.5 SENSITIVE Sensitive     Inducible Clindamycin NEGATIVE Sensitive     * METHICILLIN RESISTANT STAPHYLOCOCCUS AUREUS  Blood Culture ID Panel (Reflexed)     Status: Abnormal   Collection Time: 11/18/22 12:07 PM  Result Value Ref Range Status   Enterococcus faecalis NOT DETECTED NOT DETECTED Final   Enterococcus Faecium NOT DETECTED NOT DETECTED Final   Listeria monocytogenes NOT DETECTED NOT DETECTED Final   Staphylococcus species DETECTED (A) NOT DETECTED Final    Comment: CRITICAL RESULT CALLED TO, READ BACK BY AND VERIFIED WITH: PHARMD G. ABBOTT 11/19/22 @ 338 BY AB    Staphylococcus aureus (BCID) DETECTED (A) NOT DETECTED Final    Comment: Methicillin (oxacillin)-resistant Staphylococcus aureus (MRSA). MRSA is predictably resistant to beta-lactam antibiotics (except ceftaroline). Preferred therapy is vancomycin unless clinically contraindicated. Patient requires contact precautions if  hospitalized. CRITICAL RESULT CALLED TO, READ BACK BY AND VERIFIED WITH: PHARMD G. ABBOTT 11/19/22 @ 338 BY AB    Staphylococcus epidermidis NOT DETECTED NOT DETECTED Final   Staphylococcus lugdunensis NOT DETECTED NOT DETECTED Final   Streptococcus species NOT DETECTED NOT DETECTED Final   Streptococcus agalactiae NOT DETECTED NOT DETECTED Final   Streptococcus pneumoniae NOT DETECTED NOT DETECTED Final   Streptococcus pyogenes NOT DETECTED NOT DETECTED Final   A.calcoaceticus-baumannii NOT DETECTED NOT DETECTED Final   Bacteroides fragilis NOT DETECTED NOT DETECTED Final   Enterobacterales NOT DETECTED NOT DETECTED Final   Enterobacter cloacae complex NOT DETECTED NOT DETECTED Final   Escherichia coli NOT DETECTED NOT DETECTED Final   Klebsiella aerogenes NOT DETECTED  NOT DETECTED Final   Klebsiella oxytoca NOT DETECTED NOT DETECTED Final   Klebsiella pneumoniae NOT DETECTED NOT DETECTED Final   Proteus species NOT DETECTED NOT DETECTED Final   Salmonella species NOT DETECTED NOT DETECTED Final   Serratia marcescens NOT DETECTED NOT DETECTED Final   Haemophilus influenzae NOT DETECTED NOT DETECTED Final   Neisseria meningitidis NOT DETECTED NOT DETECTED Final   Pseudomonas aeruginosa NOT DETECTED NOT DETECTED Final   Stenotrophomonas maltophilia NOT DETECTED NOT DETECTED Final   Candida albicans NOT DETECTED NOT DETECTED Final   Candida auris NOT DETECTED NOT DETECTED Final   Candida glabrata NOT DETECTED NOT DETECTED Final   Candida krusei NOT DETECTED NOT DETECTED Final   Candida parapsilosis NOT DETECTED NOT DETECTED Final   Candida tropicalis NOT DETECTED NOT DETECTED Final   Cryptococcus neoformans/gattii NOT DETECTED NOT DETECTED Final   Meth resistant mecA/C and MREJ DETECTED (A) NOT DETECTED Final    Comment: CRITICAL RESULT CALLED TO, READ BACK BY  AND VERIFIED WITH: PHARMD G. ABBOTT 11/19/22 @ 338 BY AB Performed at [facility], [address] [place], [place] [address]   Blood culture (routine x 2)     Status: None (Preliminary result)   Collection Time: 11/19/22  3:16 AM   Specimen: BLOOD  Result Value Ref Range Status   Specimen Description BLOOD BLOOD LEFT HAND  Final   Special Requests   Final    BOTTLES DRAWN AEROBIC AND ANAEROBIC Blood Culture adequate volume   Culture   Final    NO GROWTH 2 DAYS Performed at [facility], [address] [place], [place] [address]    Report Status PENDING  Incomplete  Culture, blood (Routine X 2) w Reflex to ID Panel     Status: Abnormal   Collection Time: 11/20/22  2:20 AM   Specimen: BLOOD LEFT HAND  Result Value Ref Range Status   Specimen Description BLOOD LEFT HAND  Final   Special Requests IN PEDIATRIC BOTTLE Blood Culture adequate volume  Final   Culture  Setup Time    Final    AEROBIC BOTTLE ONLY GRAM POSITIVE COCCI IN CLUSTERS CRITICAL VALUE NOTED.  VALUE IS CONSISTENT WITH PREVIOUSLY REPORTED AND CALLED VALUE.    Culture (A)  Final    STAPHYLOCOCCUS AUREUS SUSCEPTIBILITIES PERFORMED ON PREVIOUS CULTURE WITHIN THE LAST 5 DAYS. Performed at [facility], [address] [place], [place] [address]    Report Status 11/21/2022 FINAL  Final  Culture, blood (Routine X 2) w Reflex to ID Panel     Status: Abnormal   Collection Time: 11/20/22  6:09 AM   Specimen: BLOOD LEFT HAND  Result Value Ref Range Status   Specimen Description BLOOD LEFT HAND  Final   Special Requests   Final    BOTTLES DRAWN AEROBIC ONLY Blood Culture results may not be optimal due to an inadequate volume of blood received in culture bottles   Culture  Setup Time   Final    AEROBIC BOTTLE ONLY GRAM POSITIVE COCCI IN CLUSTERS CRITICAL VALUE NOTED.  VALUE IS CONSISTENT WITH PREVIOUSLY REPORTED AND CALLED VALUE.    Culture (A)  Final    STAPHYLOCOCCUS AUREUS SUSCEPTIBILITIES PERFORMED ON PREVIOUS CULTURE WITHIN THE LAST 5 DAYS. Performed at [facility], [address] [place], [place] [address]    Report Status 11/21/2022 FINAL  Final  Culture, blood (Routine X 2) w Reflex to ID Panel     Status: None (Preliminary result)   Collection Time: 11/21/22 10:04 AM   Specimen: BLOOD LEFT HAND  Result Value Ref Range Status   Specimen Description BLOOD LEFT HAND  Final   Special Requests   Final    BOTTLES DRAWN AEROBIC AND ANAEROBIC Blood Culture adequate volume   Culture  Setup Time   Final    GRAM POSITIVE COCCI IN BOTH AEROBIC AND ANAEROBIC BOTTLES CRITICAL VALUE NOTED.  VALUE IS CONSISTENT WITH PREVIOUSLY REPORTED AND CALLED VALUE. Performed at [facility], [address], [place], [place] [address]    Culture PENDING  Incomplete   Report Status PENDING  Incomplete  Culture, blood (Routine X 2) w Reflex to ID Panel     Status: None (Preliminary result)    Collection Time: 11/21/22 10:14 AM   Specimen: BLOOD  Result Value Ref Range Status   Specimen Description BLOOD RIGHT ANTECUBITAL  Final   Special Requests   Final    BOTTLES DRAWN AEROBIC ONLY Blood  Culture adequate volume   Culture  Setup Time   Final    GRAM POSITIVE COCCI AEROBIC BOTTLE ONLY Gram Stain Report Called to,Read Back By and Verified With: PHARMD G. ABBOTT 11/21/22 @2321  BY AB Performed at [facility], [address], [place], [place]    Culture PENDING  Incomplete   Report Status PENDING  Incomplete   Pertinent Lab.    Latest Ref Rng & Units 11/19/2022    3:16 AM 11/18/2022    8:28 AM 03/24/2020    4:37 AM  CBC  WBC 4.0 - 10.5 K/uL 15.6  15.6  20.3   Hemoglobin 12.0 - 15.0 g/dL 03/26/2020  91.4  78.2   Hematocrit 36.0 - 46.0 % 31.8  36.4  35.8   Platelets 150 - 400 K/uL 194  216  425       Latest Ref Rng & Units 11/20/2022    2:20 AM 11/19/2022    3:16 AM 11/18/2022    8:28 AM  CMP  Glucose 70 - 99 mg/dL 99  01/17/2023  213   BUN 6 - 20 mg/dL 9  7  5    Creatinine 0.44 - 1.00 mg/dL 086   5.78   Sodium 135 - 145 mmol/L 134  134  130   Potassium 3.5 - 5.1 mmol/L 4.3  3.1  3.7   Chloride 98 - 111 mmol/L 102  101  97   CO2 22 - 32 mmol/L 23  24  23    Calcium 8.9 - 10.3 mg/dL 8.2  8.0  8.6   Total Protein 6.5 - 8.1 g/dL  5.6    Total Bilirubin 0.3 - 1.2 mg/dL  0.9    Alkaline Phos 38 - 126 U/L  162    AST 15 - 41 U/L  19    ALT 0 - 44 U/L  42       Pertinent Imaging today Plain films and CT images have been personally visualized and interpreted; radiology reports have been reviewed. Decision making incorporated into the Impression / Recommendations.  ECHOCARDIOGRAM COMPLETE  Result Date: 11/19/2022    ECHOCARDIOGRAM REPORT   Patient Name:   NORISSA [facility] Date of Exam: 11/19/2022 Medical Rec #:  [place]      Height:       62.0 in Accession #:    [facility]     Weight:       125.0 lb Date of Birth:  Dec 09, 1990      BSA:          1.566 m Patient  Age:    31 years       BP:           106/72 mmHg Patient Gender: F              HR:           110 bpm. Exam Location:  Inpatient Procedure: 2D Echo, Cardiac Doppler and Color Doppler Indications:    [place] R01.1  History:        Patient has no prior history of Echocardiogram examinations.                 Arrythmias:Tachycardia; Risk Factors:Current Smoker.  Sonographer:    [id number] Referring Phys: [phone] ABRAHAM FELIZ ORTIZ IMPRESSIONS  1. Left ventricular ejection fraction, by estimation, is 70 to 75%. The left ventricle has hyperdynamic function. The left ventricle has no regional wall motion abnormalities. Left ventricular diastolic parameters were normal.  2. Right  ventricular systolic function is normal. The right ventricular size is normal. Tricuspid regurgitation signal is inadequate for assessing PA pressure.  3. The mitral valve is grossly normal. Trivial mitral valve regurgitation. No evidence of mitral stenosis.  4. The aortic valve is tricuspid. Aortic valve regurgitation is not visualized. No aortic stenosis is present.  5. The inferior vena cava is normal in size with <50% respiratory variability, suggesting right atrial pressure of 8 mmHg. Conclusion(s)/Recommendation(s): Normal biventricular function without evidence of hemodynamically significant valvular heart disease. FINDINGS  Left Ventricle: Left ventricular ejection fraction, by estimation, is 70 to 75%. The left ventricle has hyperdynamic function. The left ventricle has no regional wall motion abnormalities. The left ventricular internal cavity size was normal in size. There is no left ventricular hypertrophy. Left ventricular diastolic parameters were normal. Right Ventricle: The right ventricular size is normal. No increase in right ventricular wall thickness. Right ventricular systolic function is normal. Tricuspid regurgitation signal is inadequate for assessing PA pressure. Left Atrium: Left atrial size was normal in size. Right  Atrium: Right atrial size was normal in size. Pericardium: There is no evidence of pericardial effusion. Mitral Valve: The mitral valve is grossly normal. Trivial mitral valve regurgitation. No evidence of mitral valve stenosis. Tricuspid Valve: The tricuspid valve is grossly normal. Tricuspid valve regurgitation is trivial. No evidence of tricuspid stenosis. Aortic Valve: The aortic valve is tricuspid. Aortic valve regurgitation is not visualized. No aortic stenosis is present. Aortic valve mean gradient measures 6.0 mmHg. Aortic valve peak gradient measures 11.2 mmHg. Aortic valve area, by VTI measures 2.09  cm. Pulmonic Valve: The pulmonic valve was grossly normal. Pulmonic valve regurgitation is not visualized. No evidence of pulmonic stenosis. Aorta: The aortic root and ascending aorta are structurally normal, with no evidence of dilitation. Venous: The right lower pulmonary vein is normal. The inferior vena cava is normal in size with less than 50% respiratory variability, suggesting right atrial pressure of 8 mmHg. IAS/Shunts: The atrial septum is grossly normal.  LEFT VENTRICLE PLAX 2D LVIDd:         3.90 cm   Diastology LVIDs:         2.60 cm   LV e' medial:    8.38 cm/s LV PW:         0.90 cm   LV E/e' medial:  12.9 LV IVS:        0.90 cm   LV e' lateral:   9.25 cm/s LVOT diam:     1.80 cm   LV E/e' lateral: 11.7 LV SV:         53 LV SV Index:   34 LVOT Area:     2.54 cm  RIGHT VENTRICLE             IVC RV Basal diam:  3.30 cm     IVC diam: 1.80 cm RV S prime:     13.40 cm/s TAPSE (M-mode): 2.4 cm LEFT ATRIUM             Index        RIGHT ATRIUM           Index LA diam:        3.20 cm 2.04 cm/m   RA Area:     15.90 cm LA Vol (A2C):   12.0 ml 7.67 ml/m   RA Volume:   39.50 ml  25.23 ml/m LA Vol (A4C):   27.4 ml 17.50 ml/m LA Biplane Vol: 18.9 ml 12.07 ml/m  AORTIC  VALVE AV Area (Vmax):    2.20 cm AV Area (Vmean):   2.14 cm AV Area (VTI):     2.09 cm AV Vmax:           167.00 cm/s AV Vmean:           116.000 cm/s AV VTI:            0.255 m AV Peak Grad:      11.2 mmHg AV Mean Grad:      6.0 mmHg LVOT Vmax:         144.50 cm/s LVOT Vmean:        97.750 cm/s LVOT VTI:          0.209 m LVOT/AV VTI ratio: 0.82  AORTA Ao Root diam: 2.80 cm Ao Asc diam:  2.80 cm MITRAL VALVE MV Area (PHT): 5.31 cm     SHUNTS MV Decel Time: 143 msec     Systemic VTI:  0.21 m MV E velocity: 108.00 cm/s  Systemic Diam: 1.80 cm MV A velocity: 99.40 cm/s MV E/A ratio:  1.09 Eleonore Chiquito MD Electronically signed by Eleonore Chiquito MD Signature Date/Time: 11/19/2022/10:39:33 AM    Final    CT Chest Wo Contrast  Result Date: 11/18/2022 CLINICAL DATA:  Pneumonia.  History of IV drug use. EXAM: CT CHEST WITHOUT CONTRAST TECHNIQUE: Multidetector CT imaging of the chest was performed following the standard protocol without IV contrast. RADIATION DOSE REDUCTION: This exam was performed according to the departmental dose-optimization program which includes automated exposure control, adjustment of the mA and/or kV according to patient size and/or use of iterative reconstruction technique. COMPARISON:  Chest CTA 10/30/2022 FINDINGS: Cardiovascular: Normal caliber of the thoracic aorta. Normal heart size. No pericardial effusion. Mediastinum/Nodes: No enlarged axillary lymph nodes. Limited assessment for mediastinal and hilar lymphadenopathy due to the absence of IV contrast and paucity of mediastinal fat. Unremarkable included thyroid. Nondistended esophagus. Lungs/Pleura: New small left and trace right pleural effusions. No pneumothorax. Much of the patchy ground-glass and consolidative opacity on the prior CT has resolved, however there is new more extensive consolidation in the left lower lobe, inferior lingula, and anteromedial left upper lobe. There also multiple new small peripheral nodular opacities in the upper lobes, right middle lobe, and right lower lobe, some of which demonstrate surrounding ground-glass density and with at least  one demonstrating a small focus of central cavitation (series 6, image 48 in the right upper lobe). Upper Abdomen: No acute abnormality. Musculoskeletal: No acute osseous abnormality or suspicious osseous lesion. IMPRESSION: 1. Multifocal lung consolidation consistent with pneumonia, greatest in the left lower lobe and left upper lobe/lingula. Bilateral peripheral nodules (with at least 1 being cavitary) raise the possibility of septic emboli. 2. New small left and trace right pleural effusions. Electronically Signed   By: Logan Bores M.D.   On: 11/18/2022 14:33   DG Chest 2 View  Result Date: 11/18/2022 CLINICAL DATA:  Shortness of breath. EXAM: CHEST - 2 VIEW COMPARISON:  October 30, 2022. FINDINGS: Stable cardiomediastinal silhouette. Left basilar atelectasis or pneumonia is noted with associated pleural effusion. Minimal right basilar subsegmental atelectasis or infiltrate is noted. Bony thorax is unremarkable. IMPRESSION: Left basilar atelectasis or pneumonia is noted with associated pleural effusion. Minimal right basilar subsegmental atelectasis or infiltrate. Electronically Signed   By: Marijo Conception M.D.   On: 11/18/2022 10:15     I spent 55 minutes for this patient encounter including review of prior medical records, coordination of care  with primary/other specialist with greater than 50% of time being face to face/counseling and discussing diagnostics/treatment plan with the patient/family.  Electronically signed by:   Rosiland Oz, MD Infectious Disease Physician [facility] Pager: [phone]

## 2022-11-22 NOTE — Progress Notes (Signed)
Pharmacy Antibiotic Note  Cassandra Roberts is a 32 y.o. female admitted on 11/18/2022 with MRSA bacteremia, concern for pulmonary septic emboli, and disseminated infection. Pharmacy has been consulted for Vancomycin dosing.   The patient was loaded and started on Vancomycin 750 mg IV every 12 hours. A Vancomycin peak ~4 hours after the dose today resulted as 10 mcg/ml, estimated true peak closer to 12 mcg/ml. Will increase the dose and adjust the interval to attempt to get within therapeutic range and plan to recheck levels at earliest steady state.   Noted repeat BCx on 1/13 and 1/14 remain positive however noted suboptimal levels so presumably not a Vancomycin failure. If the patient remains positive on optimal Vancomycin dosing - will need to adjust to Daptomycin + Ceftaroline.   Plan: - Adjust Vancomycin to 1g IV every 8 hours - Will plan to recheck levels on 1/16 - Will continue to follow renal function, culture results, LOT, and antibiotic de-escalation plans   Height: 5\' 2"  (157.5 cm) Weight: 56.7 kg (125 lb) IBW/kg (Calculated) : 50.1  Temp (24hrs), Avg:99.6 F (37.6 C), Min:98.6 F (37 C), Max:100.8 F (38.2 C)  Recent Labs  Lab 11/18/22 0828 11/18/22 1303 11/19/22 0316 11/20/22 0220 11/22/22 1203  WBC 15.6*  --  15.6*  --   --   CREATININE 0.59  --  0.67 0.60  --   LATICACIDVEN  --  1.4  --   --   --   VANCOPEAK  --   --   --   --  10*    Estimated Creatinine Clearance: 80.6 mL/min (by C-G formula based on SCr of 0.6 mg/dL).    Allergies  Allergen Reactions   Dextromethorphan     Shaking    Antimicrobials this admission: Cefepime 1/11 x 1 Vanc 1/11 >>  Dose adjustments this admission: Vanc peak 10 (measured), corrected to 12 on Vanc 750 mg/12h  >> adjust to 1g/8h  Microbiology results: 1/11 BCx >> 1/2 MRSA 1/12 BCx >> ngx3d 1/13 BCx 2/2 MRSA 1/14 BCx >> 3/3 MRSA 1/15 BCx (ordered)  Thank you for allowing pharmacy to be a part of this patient's  care.  Alycia Rossetti, PharmD, BCPS Infectious Diseases Clinical Pharmacist 11/22/2022 1:59 PM   **Pharmacist phone directory can now be found on [email or web address] ([email or web address]).  Listed under [place].

## 2022-11-22 NOTE — Progress Notes (Signed)
[facility] PROGRESS NOTE    Progress Note  Cassandra Roberts  QMV:[id number] DOB: November 22, 1990 DOA: 11/18/2022 PCP: Patient, No Pcp Per     Brief Narrative:   Cassandra Roberts is an 32 y.o. 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   ID was consulted recommended TEE on Tuesday. Blood cultures x 3 continue to be positive for staph Aureus last one was on 11/21/2022. Tmax of 100.8.   She has refused MRI she relates she cannot tolerated. TEE on 1.16.2024  Polysubstance abuse (HCC)/ IVDU (intravenous drug user) Continue on MS Contin.    DVT prophylaxis: lovenox Family Communication:none Status is: Inpatient Remains inpatient appropriate because: MRSA bacteremia    Code Status:     Code Status Orders  (From admission, onward)           Start     Ordered   11/18/22 1623  Full code  Continuous       Question:  By:  Answer:  Consent: discussion documented in EHR   11/18/22 1625           Code Status History     Date Active Date Inactive Code Status Order ID Comments User Context   03/23/2020 1622 03/25/2020 0102 Full Code [id number]  Charlann Lange, MD Inpatient   08/21/2013 1049 08/21/2013 2156 Full Code [id number]  Dewaine Oats, PA-C ED         IV Access:   Peripheral IV   Procedures and diagnostic studies:   No results found.   Medical Consultants:    None.   Subjective:    Cassandra Roberts relates her pain is controlled.  Objective:    Vitals:   11/22/22 0800 11/22/22 0804 11/22/22 0900 11/22/22 1000  BP:  133/84    Pulse: (!) 109  87 91  Resp:      Temp:  99.9 F (37.7 C)    TempSrc:  Axillary    SpO2: 97%  95% 97%  Weight:      Height:       SpO2: 97 % O2 Flow Rate (L/min): 2 L/min   Intake/Output Summary (Last 24 hours) at 11/22/2022 1028 Last data filed at 11/22/2022 0300 Gross per 24 hour  Intake --  Output 500 ml  Net -500 ml    Filed Weights   11/18/22 0805  Weight: 56.7 kg    Exam: General exam: In no acute distress. Respiratory system: Good air movement and clear to auscultation. Cardiovascular system: S1 & S2 heard, RRR. No JVD. Gastrointestinal system: Abdomen is nondistended, soft and nontender.  Extremities: No pedal edema. Skin: No rashes, lesions or ulcers Psychiatry: Judgement and insight appear normal. Mood & affect appropriate. Data Reviewed:    Labs: Basic Metabolic Panel: Recent Labs  Lab 11/18/22 0828 11/19/22 0316 11/20/22 0220  NA 130* 134* 134*  K 3.7 3.1* 4.3  CL 97* 101 102  CO2 23 24 23   GLUCOSE 125* 149* 99  BUN 5* 7  9  CREATININE 0.59 0.67 0.60  CALCIUM 8.6* 8.0* 8.2*    GFR Estimated Creatinine Clearance: 80.6 mL/min (by C-G formula based on SCr of 0.6 mg/dL). Liver Function Tests: Recent Labs  Lab 11/19/22 0316  AST 19  ALT 42  ALKPHOS 162*  BILITOT 0.9  PROT 5.6*  ALBUMIN 2.3*    No results for input(s): "LIPASE", "AMYLASE" in the last 168 hours. No results for input(s): "AMMONIA" in the last 168 hours. Coagulation profile No results for input(s): "INR", "PROTIME" in the last 168 hours. COVID-19 Labs  No results for input(s): "DDIMER", "FERRITIN", "LDH", "CRP" in the last 72 hours.  Lab Results  Component Value Date   SARSCOV2NAA NEGATIVE 11/18/2022   SARSCOV2NAA NEGATIVE 03/23/2020    CBC: Recent Labs  Lab 11/18/22 0828  11/19/22 0316  WBC 15.6* 15.6*  HGB 12.6 11.2*  HCT 36.4 31.8*  MCV 87.3 85.9  PLT 216 194    Cardiac Enzymes: No results for input(s): "CKTOTAL", "CKMB", "CKMBINDEX", "TROPONINI" in the last 168 hours. BNP (last 3 results) No results for input(s): "PROBNP" in the last 8760 hours. CBG: No results for input(s): "GLUCAP" in the last 168 hours. D-Dimer: No results for input(s): "DDIMER" in the last 72 hours. Hgb A1c: No results for input(s): "HGBA1C" in the last 72 hours. Lipid Profile: No results for input(s): "CHOL", "HDL", "LDLCALC", "TRIG", "CHOLHDL", "LDLDIRECT" in the last 72 hours. Thyroid function studies: No results for input(s): "TSH", "T4TOTAL", "T3FREE", "THYROIDAB" in the last 72 hours.  Invalid input(s): "FREET3" Anemia work up: No results for input(s): "VITAMINB12", "FOLATE", "FERRITIN", "TIBC", "IRON", "RETICCTPCT" in the last 72 hours. Sepsis Labs: Recent Labs  Lab 11/18/22 0828 11/18/22 1303 11/19/22 0316  WBC 15.6*  --  15.6*  LATICACIDVEN  --  1.4  --     Microbiology Recent Results (from the past 240 hour(s))  Resp panel by RT-PCR (RSV, Flu A&B, Covid) Anterior Nasal Swab     Status: None   Collection Time: 11/18/22 10:08 AM   Specimen: Anterior Nasal Swab  Result Value Ref Range Status   SARS Coronavirus 2 by RT PCR NEGATIVE NEGATIVE Final    Comment: (NOTE) SARS-CoV-2 target nucleic acids are NOT DETECTED.  The SARS-CoV-2 RNA is generally detectable in upper respiratory specimens during the acute phase of infection. The lowest concentration of SARS-CoV-2 viral copies this assay can detect is 138 copies/mL. A negative result does not preclude SARS-Cov-2 infection and should not be used as the sole basis for treatment or other patient management decisions. A negative result may occur with  improper specimen collection/handling, submission of specimen other than nasopharyngeal swab, presence of viral mutation(s) within the areas targeted by this  assay, and inadequate number of viral copies(<138 copies/mL). A negative result must be combined with clinical observations, patient history, and epidemiological information. The expected result is Negative.  Fact Sheet for Patients:  BloggerCourse.com  Fact Sheet for Healthcare Providers:  SeriousBroker.it  This test is no t yet approved or cleared by the [place] FDA and  has been authorized for detection and/or diagnosis of SARS-CoV-2 by FDA under an Emergency Use Authorization (EUA). This EUA will remain  in effect (meaning this test can be used) for the duration of the COVID-19 declaration under Section 564(b)(1) of the Act, 21 U.S.C.section 360bbb-3(b)(1), unless the authorization is terminated  or revoked sooner.       Influenza A by PCR NEGATIVE NEGATIVE Final   Influenza B by PCR NEGATIVE NEGATIVE Final  Comment: (NOTE) The Xpert Xpress SARS-CoV-2/FLU/RSV plus assay is intended as an aid in the diagnosis of influenza from Nasopharyngeal swab specimens and should not be used as a sole basis for treatment. Nasal washings and aspirates are unacceptable for Xpert Xpress SARS-CoV-2/FLU/RSV testing.  Fact Sheet for Patients: BloggerCourse.com  Fact Sheet for Healthcare Providers: SeriousBroker.it  This test is not yet approved or cleared by the [place] FDA and has been authorized for detection and/or diagnosis of SARS-CoV-2 by FDA under an Emergency Use Authorization (EUA). This EUA will remain in effect (meaning this test can be used) for the duration of the COVID-19 declaration under Section 564(b)(1) of the Act, 21 U.S.C. section 360bbb-3(b)(1), unless the authorization is terminated or revoked.     Resp Syncytial Virus by PCR NEGATIVE NEGATIVE Final    Comment: (NOTE) Fact Sheet for Patients: BloggerCourse.com  Fact Sheet for  Healthcare Providers: SeriousBroker.it  This test is not yet approved or cleared by the [place] FDA and has been authorized for detection and/or diagnosis of SARS-CoV-2 by FDA under an Emergency Use Authorization (EUA). This EUA will remain in effect (meaning this test can be used) for the duration of the COVID-19 declaration under Section 564(b)(1) of the Act, 21 U.S.C. section 360bbb-3(b)(1), unless the authorization is terminated or revoked.  Performed at [facility], [address] [place], [place] [address]   Blood culture (routine x 2)     Status: Abnormal   Collection Time: 11/18/22 12:07 PM   Specimen: BLOOD  Result Value Ref Range Status   Specimen Description BLOOD SITE NOT SPECIFIED  Final   Special Requests   Final    BOTTLES DRAWN AEROBIC AND ANAEROBIC Blood Culture adequate volume   Culture  Setup Time   Final    GRAM POSITIVE COCCI IN BOTH AEROBIC AND ANAEROBIC BOTTLES CRITICAL RESULT CALLED TO, READ BACK BY AND VERIFIED WITH: PHARMD G. ABBOTT 11/19/22 @ 338 BY AB Performed at [facility], [address], [place], [place] [address]    Culture METHICILLIN RESISTANT STAPHYLOCOCCUS AUREUS (A)  Final   Report Status 11/21/2022 FINAL  Final   Organism ID, Bacteria METHICILLIN RESISTANT STAPHYLOCOCCUS AUREUS  Final      Susceptibility   Methicillin resistant staphylococcus aureus - MIC*    CIPROFLOXACIN >=8 RESISTANT Resistant     ERYTHROMYCIN >=8 RESISTANT Resistant     GENTAMICIN <=0.5 SENSITIVE Sensitive     OXACILLIN >=4 RESISTANT Resistant     TETRACYCLINE <=1 SENSITIVE Sensitive     VANCOMYCIN <=0.5 SENSITIVE Sensitive     TRIMETH/SULFA <=10 SENSITIVE Sensitive     CLINDAMYCIN >=8 RESISTANT Resistant     RIFAMPIN <=0.5 SENSITIVE Sensitive     Inducible Clindamycin NEGATIVE Sensitive     * METHICILLIN RESISTANT STAPHYLOCOCCUS AUREUS  Blood Culture ID Panel (Reflexed)     Status: Abnormal   Collection Time:  11/18/22 12:07 PM  Result Value Ref Range Status   Enterococcus faecalis NOT DETECTED NOT DETECTED Final   Enterococcus Faecium NOT DETECTED NOT DETECTED Final   Listeria monocytogenes NOT DETECTED NOT DETECTED Final   Staphylococcus species DETECTED (A) NOT DETECTED Final    Comment: CRITICAL RESULT CALLED TO, READ BACK BY AND VERIFIED WITH: PHARMD G. ABBOTT 11/19/22 @ 338 BY AB    Staphylococcus aureus (BCID) DETECTED (A) NOT DETECTED Final    Comment: Methicillin (oxacillin)-resistant Staphylococcus aureus (MRSA). MRSA is predictably resistant to beta-lactam antibiotics (except ceftaroline). Preferred therapy is vancomycin unless clinically contraindicated.  Patient requires contact precautions if  hospitalized. CRITICAL RESULT CALLED TO, READ BACK BY AND VERIFIED WITH: PHARMD G. ABBOTT 11/19/22 @ 338 BY AB    Staphylococcus epidermidis NOT DETECTED NOT DETECTED Final   Staphylococcus lugdunensis NOT DETECTED NOT DETECTED Final   Streptococcus species NOT DETECTED NOT DETECTED Final   Streptococcus agalactiae NOT DETECTED NOT DETECTED Final   Streptococcus pneumoniae NOT DETECTED NOT DETECTED Final   Streptococcus pyogenes NOT DETECTED NOT DETECTED Final   A.calcoaceticus-baumannii NOT DETECTED NOT DETECTED Final   Bacteroides fragilis NOT DETECTED NOT DETECTED Final   Enterobacterales NOT DETECTED NOT DETECTED Final   Enterobacter cloacae complex NOT DETECTED NOT DETECTED Final   Escherichia coli NOT DETECTED NOT DETECTED Final   Klebsiella aerogenes NOT DETECTED NOT DETECTED Final   Klebsiella oxytoca NOT DETECTED NOT DETECTED Final   Klebsiella pneumoniae NOT DETECTED NOT DETECTED Final   Proteus species NOT DETECTED NOT DETECTED Final   Salmonella species NOT DETECTED NOT DETECTED Final   Serratia marcescens NOT DETECTED NOT DETECTED Final   Haemophilus influenzae NOT DETECTED NOT DETECTED Final   Neisseria meningitidis NOT DETECTED NOT DETECTED Final   Pseudomonas aeruginosa  NOT DETECTED NOT DETECTED Final   Stenotrophomonas maltophilia NOT DETECTED NOT DETECTED Final   Candida albicans NOT DETECTED NOT DETECTED Final   Candida auris NOT DETECTED NOT DETECTED Final   Candida glabrata NOT DETECTED NOT DETECTED Final   Candida krusei NOT DETECTED NOT DETECTED Final   Candida parapsilosis NOT DETECTED NOT DETECTED Final   Candida tropicalis NOT DETECTED NOT DETECTED Final   Cryptococcus neoformans/gattii NOT DETECTED NOT DETECTED Final   Meth resistant mecA/C and MREJ DETECTED (A) NOT DETECTED Final    Comment: CRITICAL RESULT CALLED TO, READ BACK BY AND VERIFIED WITH: PHARMD G. ABBOTT 11/19/22 @ 338 BY AB Performed at [facility], [address] [place], [place] [address]   Blood culture (routine x 2)     Status: None (Preliminary result)   Collection Time: 11/19/22  3:16 AM   Specimen: BLOOD  Result Value Ref Range Status   Specimen Description BLOOD BLOOD LEFT HAND  Final   Special Requests   Final    BOTTLES DRAWN AEROBIC AND ANAEROBIC Blood Culture adequate volume   Culture   Final    NO GROWTH 2 DAYS Performed at [facility], [place] [address], [place], [place] [address]    Report Status PENDING  Incomplete  Culture, blood (Routine X 2) w Reflex to ID Panel     Status: Abnormal   Collection Time: 11/20/22  2:20 AM   Specimen: BLOOD LEFT HAND  Result Value Ref Range Status   Specimen Description BLOOD LEFT HAND  Final   Special Requests IN PEDIATRIC BOTTLE Blood Culture adequate volume  Final   Culture  Setup Time   Final    AEROBIC BOTTLE ONLY GRAM POSITIVE COCCI IN CLUSTERS CRITICAL VALUE NOTED.  VALUE IS CONSISTENT WITH PREVIOUSLY REPORTED AND CALLED VALUE.    Culture (A)  Final    STAPHYLOCOCCUS AUREUS SUSCEPTIBILITIES PERFORMED ON PREVIOUS CULTURE WITHIN THE LAST 5 DAYS. Performed at [facility], [place] [address] [place], [place] [address]    Report Status 11/21/2022 FINAL  Final  Culture, blood (Routine X 2) w Reflex to  ID Panel     Status: Abnormal   Collection Time: 11/20/22  6:09 AM   Specimen: BLOOD LEFT HAND  Result Value Ref Range Status   Specimen Description BLOOD  LEFT HAND  Final   Special Requests   Final    BOTTLES DRAWN AEROBIC ONLY Blood Culture results may not be optimal due to an inadequate volume of blood received in culture bottles   Culture  Setup Time   Final    AEROBIC BOTTLE ONLY GRAM POSITIVE COCCI IN CLUSTERS CRITICAL VALUE NOTED.  VALUE IS CONSISTENT WITH PREVIOUSLY REPORTED AND CALLED VALUE.    Culture (A)  Final    STAPHYLOCOCCUS AUREUS SUSCEPTIBILITIES PERFORMED ON PREVIOUS CULTURE WITHIN THE LAST 5 DAYS. Performed at [facility], [address], [place], [place] [address]    Report Status 11/21/2022 FINAL  Final  Culture, blood (Routine X 2) w Reflex to ID Panel     Status: None (Preliminary result)   Collection Time: 11/21/22 10:04 AM   Specimen: BLOOD LEFT HAND  Result Value Ref Range Status   Specimen Description BLOOD LEFT HAND  Final   Special Requests   Final    BOTTLES DRAWN AEROBIC AND ANAEROBIC Blood Culture adequate volume   Culture  Setup Time   Final    GRAM POSITIVE COCCI IN BOTH AEROBIC AND ANAEROBIC BOTTLES CRITICAL VALUE NOTED.  VALUE IS CONSISTENT WITH PREVIOUSLY REPORTED AND CALLED VALUE. Performed at [facility], [address] [place], [place] [address]    Culture PENDING  Incomplete   Report Status PENDING  Incomplete  Culture, blood (Routine X 2) w Reflex to ID Panel     Status: Abnormal (Preliminary result)   Collection Time: 11/21/22 10:14 AM   Specimen: BLOOD  Result Value Ref Range Status   Specimen Description BLOOD RIGHT ANTECUBITAL  Final   Special Requests   Final    BOTTLES DRAWN AEROBIC ONLY Blood Culture adequate volume   Culture  Setup Time   Final    GRAM POSITIVE COCCI AEROBIC BOTTLE ONLY Gram Stain Report Called to,Read Back By and Verified With: PHARMD G. ABBOTT 11/21/22 @2321  BY AB    Culture (A)  Final     STAPHYLOCOCCUS AUREUS CULTURE REINCUBATED FOR BETTER GROWTH Performed at [facility], [address] [place], [place]    Report Status PENDING  Incomplete     Medications:    enoxaparin (LOVENOX) injection  40 mg Subcutaneous Q24H   metoprolol tartrate  25 mg Oral BID   morphine  30 mg Oral Q12H   Continuous Infusions:  vancomycin 750 mg (11/22/22 0812)      LOS: 4 days   11/24/22  [facility]  11/22/2022, 10:28 AM

## 2022-11-22 NOTE — TOC Transition Note (Signed)
Pt was not discharged over the week-end. Discharge meds are now stored at TOC pharmacy. 

## 2022-11-23 ENCOUNTER — Inpatient Hospital Stay (HOSPITAL_COMMUNITY): Payer: Medicaid Other

## 2022-11-23 DIAGNOSIS — M25512 Pain in left shoulder: Secondary | ICD-10-CM

## 2022-11-23 DIAGNOSIS — M25552 Pain in left hip: Secondary | ICD-10-CM

## 2022-11-23 DIAGNOSIS — M25511 Pain in right shoulder: Secondary | ICD-10-CM

## 2022-11-23 LAB — CK: Total CK: 16 U/L — ABNORMAL LOW (ref 38–234)

## 2022-11-23 LAB — BASIC METABOLIC PANEL
Anion gap: 9 (ref 5–15)
BUN: 6 mg/dL (ref 6–20)
CO2: 24 mmol/L (ref 22–32)
Calcium: 7.7 mg/dL — ABNORMAL LOW (ref 8.9–10.3)
Chloride: 102 mmol/L (ref 98–111)
Creatinine, Ser: 0.5 mg/dL (ref 0.44–1.00)
GFR, Estimated: >60 mL/min (ref >60)
Glucose, Bld: 101 mg/dL — ABNORMAL HIGH (ref 70–99)
Potassium: 4.3 mmol/L (ref 3.5–5.1)
Sodium: 135 mmol/L (ref 135–145)

## 2022-11-23 LAB — VANCOMYCIN, PEAK: Vancomycin Pk: 26 ug/mL — ABNORMAL LOW (ref 30–40)

## 2022-11-23 LAB — VANCOMYCIN, RANDOM: Vancomycin Rm: 14 ug/mL

## 2022-11-23 MED ORDER — FENTANYL CITRATE PF 50 MCG/ML IJ SOSY
50.0000 ug | PREFILLED_SYRINGE | Freq: Once | INTRAMUSCULAR | Status: AC | PRN
  Administered 2022-11-23: 50 ug via INTRAVENOUS
  Filled 2022-11-23: qty 1

## 2022-11-23 MED ORDER — FENTANYL CITRATE PF 50 MCG/ML IJ SOSY: 25.0000 ug | PREFILLED_SYRINGE | Freq: Once | INTRAMUSCULAR | Status: DC | PRN

## 2022-11-23 MED ORDER — SODIUM CHLORIDE 0.9 % IV SOLN
500.0000 mg | Freq: Every day | INTRAVENOUS | Status: AC
  Administered 2022-11-23 – 2022-11-29 (×7): 500 mg via INTRAVENOUS
  Filled 2022-11-23 (×7): qty 10

## 2022-11-23 MED ORDER — NICOTINE 14 MG/24HR TD PT24
14.0000 mg | MEDICATED_PATCH | Freq: Every day | TRANSDERMAL | Status: DC
  Administered 2022-11-23 – 2022-12-02 (×9): 14 mg via TRANSDERMAL
  Filled 2022-11-23 (×10): qty 1

## 2022-11-23 MED ORDER — SODIUM CHLORIDE 0.9 % IV SOLN
600.0000 mg | Freq: Three times a day (TID) | INTRAVENOUS | Status: AC
  Administered 2022-11-23 – 2022-11-29 (×16): 600 mg via INTRAVENOUS
  Filled 2022-11-23 (×22): qty 20

## 2022-11-23 MED ORDER — GADOBUTROL 1 MMOL/ML IV SOLN
5.0000 mL | Freq: Once | INTRAVENOUS | Status: AC | PRN
  Administered 2022-11-23: 5 mL via INTRAVENOUS

## 2022-11-23 NOTE — Progress Notes (Signed)
   [place] has been requested to perform a transesophageal echocardiogram on Cassandra Roberts for MRSA Bacteremia.  After careful review of history and examination, the risks and benefits of transesophageal echocardiogram have been explained including risks of esophageal damage, perforation (1:10,000 risk), bleeding, pharyngeal hematoma as well as other potential complications associated with conscious sedation including aspiration, arrhythmia, respiratory failure and death. Alternatives to treatment were discussed, questions were answered. Patient is willing to proceed. Pt's family in room as well.   Cecilie Kicks, NP  11/23/2022 4:12 PM

## 2022-11-23 NOTE — Progress Notes (Signed)
ID Brief Note   MRI left shoulder 11/22/22  IMPRESSION: 1. Moderate ill-defined fluid posteriorly between the deltoid, infraspinatus and teres minor muscles which could be infected. No well-defined focal fluid collection identified. 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  Recommendations [facility] for arthrocentesis as well as possible need of I and D given concerns of infected fluid in the setting of MRSA bacteremia  Rosiland Oz, MD Infectious Disease Physician [facility] [address] [place], [place] [address] Phone: [phone]  Fax: [phone]

## 2022-11-23 NOTE — Progress Notes (Signed)
[facility] Infectious Diseases Follow Up Note  Patient Identification: Patient Name: Cassandra Roberts MRN: [id number] [place] Date: 11/18/2022  8:00 AM Age: 32 y.o.Today's Date: 11/23/2022  Reason for Visit: MRSA bacteremia   Principal Problem:   MRSA bacteremia Active Problems:   Polysubstance abuse ([place])   IVDU (intravenous drug user)   CAP (community acquired pneumonia)   Septic embolism ([place])   Sepsis ([place])   Sinus tachycardia   Cellulitis of left foot   Multifocal pneumonia   Staphylococcal arthritis of left shoulder (HCC)   Chronic hepatitis C with hepatic coma (HCC)   Pyomyositis   Antibiotics: Vancomycin 1/11-c Total days of abtx d 6  Lines/Hardwares: No known   Interval Events: afebrile in the last 24 hrs. Persistently positive blood cultures. MRI findings noted    Assessment 32 year old female with history of IVDU and polysubstance abuse was initially seen at [facility] on 12/23 for multifocal pneumonia and presented to [facility] ED for worsening cough and left lower extremity cellulitis.  Admitted with  # MRSA bacteremia with possible pulmonary septic emboli with at least with cavity - no spinal tenderness at CTL spine  - 1/11 Blood cx  MRSA - 1/12 blood cx NG -1/13 blood cx 2/2 sets  MRSA - 1/14 blood cx 2/2 sets MRSA - 1/5 blood cx 2/2 sets GPC in clusters  # Moderate ill-defined fluid posteriorly between the deltoid, infraspinatus and teres minor muscles which could be infected. No well-defined focal fluid collection identified. 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  # Left groin pain - no complaints at the left thigh, no exam findings s/o infection in left thigh   # Hepatitis C - management OP  Recommendations Multiple possibilities for persistent MRSA bacteremia: has not received therapeutic Vancomycin levels until 1/15 evening, source control issue with possible septic left shoulder which might need I and D.  Either case, will change Vancomycin to dual therapy with daptomycin + ceftaroline as a salvage regimen.   Orthopedics PA Silvestre Gunner has been consulted, will follow if I and D needed,   I have ordered MRI Left hip wwo contrast given she has some pain in the left groin. She is agreeable to do today. She expressed difficulty sitting down for 2 MRIS yesterday.   2 sets of repeat blood cx ordered   Monitor CPK as well as metastatic sites of infection  D/w primary  Following   Rest of the management as per the primary team. Thank you for the consult. Please page with pertinent questions or concerns.  ______________________________________________________________________ Subjective patient seen and examined at the bedside. Complains of some pain in the left groin. No pain, swelling and tenderness at the left thigh. She is willing to  have MRI lf left hip done today.   Past Medical History:  Diagnosis Date   Attention deficit disorder (ADD)    Hepatitis C    Substance abuse ([place])     Past Surgical History:  Procedure Laterality Date   APPENDECTOMY     MINOR IRRIGATION AND DEBRIDEMENT OF WOUND Left 03/23/2020   Procedure: MINOR IRRIGATION AND DEBRIDEMENT OF WOUND;  Surgeon: Mcarthur Rossetti, MD;  Location: [facility];  Service: Orthopedics;  Laterality: Left;   Vitals BP 114/75 (BP Location: Left Arm)   Pulse 76   Temp 98 F (36.7 C) (Oral)   Resp 16   Ht 5\' 2"  (1.575 m)   Wt 56.7 kg   LMP 11/04/2022   SpO2 96%   BMI 22.86 kg/m  Physical Exam Constitutional:  walking around the room and appears comfortable     Comments:   Cardiovascular:     Rate and Rhythm: Normal rate and regular rhythm.     Heart sounds:  Pulmonary:     Effort: Pulmonary effort is normal on room air    Comments:   Abdominal:     Palpations: Abdomen is soft.     Tenderness: non distended and non tender   Musculoskeletal:        General: mild swelling and tenderness at the left  shoulder with restricted ROM of left shoulder due to pain. No warmth.                    Left thigh with no erythema/swelling and tenderness.                    No signs of septic peripheral joints                    No CTL spine tenderness Skin:    Comments: multiple tattoos  Neurological:     General:awake, alert and oriented, follows commands. Grossly non focal and ambulatory   Pertinent Microbiology Results for orders placed or performed during the hospital encounter of 11/18/22  Resp panel by RT-PCR (RSV, Flu A&B, Covid) Anterior Nasal Swab     Status: None   Collection Time: 11/18/22 10:08 AM   Specimen: Anterior Nasal Swab  Result Value Ref Range Status   SARS Coronavirus 2 by RT PCR NEGATIVE NEGATIVE Final    Comment: (NOTE) SARS-CoV-2 target nucleic acids are NOT DETECTED.  The SARS-CoV-2 RNA is generally detectable in upper respiratory specimens during the acute phase of infection. The lowest concentration of SARS-CoV-2 viral copies this assay can detect is 138 copies/mL. A negative result does not preclude SARS-Cov-2 infection and should not be used as the sole basis for treatment or other patient management decisions. A negative result may occur with  improper specimen collection/handling, submission of specimen other than nasopharyngeal swab, presence of viral mutation(s) within the areas targeted by this assay, and inadequate number of viral copies(<138 copies/mL). A negative result must be combined with clinical observations, patient history, and epidemiological information. The expected result is Negative.  Fact Sheet for Patients:  BloggerCourse.com  Fact Sheet for Healthcare Providers:  SeriousBroker.it  This test is no t yet approved or cleared by the [place] FDA and  has been authorized for detection and/or diagnosis of SARS-CoV-2 by FDA under an Emergency Use Authorization (EUA). This EUA will remain   in effect (meaning this test can be used) for the duration of the COVID-19 declaration under Section 564(b)(1) of the Act, 21 U.S.C.section 360bbb-3(b)(1), unless the authorization is terminated  or revoked sooner.       Influenza A by PCR NEGATIVE NEGATIVE Final   Influenza B by PCR NEGATIVE NEGATIVE Final    Comment: (NOTE) The Xpert Xpress SARS-CoV-2/FLU/RSV plus assay is intended as an aid in the diagnosis of influenza from Nasopharyngeal swab specimens and should not be used as a sole basis for treatment. Nasal washings and aspirates are unacceptable for Xpert Xpress SARS-CoV-2/FLU/RSV testing.  Fact Sheet for Patients: BloggerCourse.com  Fact Sheet for Healthcare Providers: SeriousBroker.it  This test is not yet approved or cleared by the [place] FDA and has been authorized for detection and/or diagnosis of SARS-CoV-2 by FDA under an Emergency Use Authorization (EUA). This EUA will remain in effect (meaning this  test can be used) for the duration of the COVID-19 declaration under Section 564(b)(1) of the Act, 21 U.S.C. section 360bbb-3(b)(1), unless the authorization is terminated or revoked.     Resp Syncytial Virus by PCR NEGATIVE NEGATIVE Final    Comment: (NOTE) Fact Sheet for Patients: EntrepreneurPulse.com.au  Fact Sheet for Healthcare Providers: IncredibleEmployment.be  This test is not yet approved or cleared by the [place] FDA and has been authorized for detection and/or diagnosis of SARS-CoV-2 by FDA under an Emergency Use Authorization (EUA). This EUA will remain in effect (meaning this test can be used) for the duration of the COVID-19 declaration under Section 564(b)(1) of the Act, 21 U.S.C. section 360bbb-3(b)(1), unless the authorization is terminated or revoked.  Performed at [facility], [place] [address] [place], [place] [address]   Blood  culture (routine x 2)     Status: Abnormal   Collection Time: 11/18/22 12:07 PM   Specimen: BLOOD  Result Value Ref Range Status   Specimen Description BLOOD SITE NOT SPECIFIED  Final   Special Requests   Final    BOTTLES DRAWN AEROBIC AND ANAEROBIC Blood Culture adequate volume   Culture  Setup Time   Final    GRAM POSITIVE COCCI IN BOTH AEROBIC AND ANAEROBIC BOTTLES CRITICAL RESULT CALLED TO, READ BACK BY AND VERIFIED WITH: PHARMD G. ABBOTT 11/19/22 @ 338 BY AB Performed at [facility], [place] [address] [place], [place] [address]    Culture METHICILLIN RESISTANT STAPHYLOCOCCUS AUREUS (A)  Final   Report Status 11/21/2022 FINAL  Final   Organism ID, Bacteria METHICILLIN RESISTANT STAPHYLOCOCCUS AUREUS  Final      Susceptibility   Methicillin resistant staphylococcus aureus - MIC*    CIPROFLOXACIN >=8 RESISTANT Resistant     ERYTHROMYCIN >=8 RESISTANT Resistant     GENTAMICIN <=0.5 SENSITIVE Sensitive     OXACILLIN >=4 RESISTANT Resistant     TETRACYCLINE <=1 SENSITIVE Sensitive     VANCOMYCIN <=0.5 SENSITIVE Sensitive     TRIMETH/SULFA <=10 SENSITIVE Sensitive     CLINDAMYCIN >=8 RESISTANT Resistant     RIFAMPIN <=0.5 SENSITIVE Sensitive     Inducible Clindamycin NEGATIVE Sensitive     * METHICILLIN RESISTANT STAPHYLOCOCCUS AUREUS  Blood Culture ID Panel (Reflexed)     Status: Abnormal   Collection Time: 11/18/22 12:07 PM  Result Value Ref Range Status   Enterococcus faecalis NOT DETECTED NOT DETECTED Final   Enterococcus Faecium NOT DETECTED NOT DETECTED Final   Listeria monocytogenes NOT DETECTED NOT DETECTED Final   Staphylococcus species DETECTED (A) NOT DETECTED Final    Comment: CRITICAL RESULT CALLED TO, READ BACK BY AND VERIFIED WITH: PHARMD G. ABBOTT 11/19/22 @ 338 BY AB    Staphylococcus aureus (BCID) DETECTED (A) NOT DETECTED Final    Comment: Methicillin (oxacillin)-resistant Staphylococcus aureus (MRSA). MRSA is predictably resistant to beta-lactam  antibiotics (except ceftaroline). Preferred therapy is vancomycin unless clinically contraindicated. Patient requires contact precautions if  hospitalized. CRITICAL RESULT CALLED TO, READ BACK BY AND VERIFIED WITH: PHARMD G. ABBOTT 11/19/22 @ 338 BY AB    Staphylococcus epidermidis NOT DETECTED NOT DETECTED Final   Staphylococcus lugdunensis NOT DETECTED NOT DETECTED Final   Streptococcus species NOT DETECTED NOT DETECTED Final   Streptococcus agalactiae NOT DETECTED NOT DETECTED Final   Streptococcus pneumoniae NOT DETECTED NOT DETECTED Final   Streptococcus pyogenes NOT DETECTED NOT DETECTED Final   A.calcoaceticus-baumannii NOT DETECTED NOT DETECTED Final   Bacteroides fragilis NOT DETECTED NOT DETECTED Final  Enterobacterales NOT DETECTED NOT DETECTED Final   Enterobacter cloacae complex NOT DETECTED NOT DETECTED Final   Escherichia coli NOT DETECTED NOT DETECTED Final   Klebsiella aerogenes NOT DETECTED NOT DETECTED Final   Klebsiella oxytoca NOT DETECTED NOT DETECTED Final   Klebsiella pneumoniae NOT DETECTED NOT DETECTED Final   Proteus species NOT DETECTED NOT DETECTED Final   Salmonella species NOT DETECTED NOT DETECTED Final   Serratia marcescens NOT DETECTED NOT DETECTED Final   Haemophilus influenzae NOT DETECTED NOT DETECTED Final   Neisseria meningitidis NOT DETECTED NOT DETECTED Final   Pseudomonas aeruginosa NOT DETECTED NOT DETECTED Final   Stenotrophomonas maltophilia NOT DETECTED NOT DETECTED Final   Candida albicans NOT DETECTED NOT DETECTED Final   Candida auris NOT DETECTED NOT DETECTED Final   Candida glabrata NOT DETECTED NOT DETECTED Final   Candida krusei NOT DETECTED NOT DETECTED Final   Candida parapsilosis NOT DETECTED NOT DETECTED Final   Candida tropicalis NOT DETECTED NOT DETECTED Final   Cryptococcus neoformans/gattii NOT DETECTED NOT DETECTED Final   Meth resistant mecA/C and MREJ DETECTED (A) NOT DETECTED Final    Comment: CRITICAL RESULT CALLED  TO, READ BACK BY AND VERIFIED WITH: PHARMD G. ABBOTT 11/19/22 @ 338 BY AB Performed at [facility], [address] [place], [place] [address]   Blood culture (routine x 2)     Status: None (Preliminary result)   Collection Time: 11/19/22  3:16 AM   Specimen: BLOOD  Result Value Ref Range Status   Specimen Description BLOOD BLOOD LEFT HAND  Final   Special Requests   Final    BOTTLES DRAWN AEROBIC AND ANAEROBIC Blood Culture adequate volume   Culture   Final    NO GROWTH 3 DAYS Performed at [facility], [address] [place], [place] [address]    Report Status PENDING  Incomplete  Culture, blood (Routine X 2) w Reflex to ID Panel     Status: Abnormal   Collection Time: 11/20/22  2:20 AM   Specimen: BLOOD LEFT HAND  Result Value Ref Range Status   Specimen Description BLOOD LEFT HAND  Final   Special Requests IN PEDIATRIC BOTTLE Blood Culture adequate volume  Final   Culture  Setup Time   Final    AEROBIC BOTTLE ONLY GRAM POSITIVE COCCI IN CLUSTERS CRITICAL VALUE NOTED.  VALUE IS CONSISTENT WITH PREVIOUSLY REPORTED AND CALLED VALUE.    Culture (A)  Final    STAPHYLOCOCCUS AUREUS SUSCEPTIBILITIES PERFORMED ON PREVIOUS CULTURE WITHIN THE LAST 5 DAYS. Performed at [facility], [address] [place], [place] [address]    Report Status 11/21/2022 FINAL  Final  Culture, blood (Routine X 2) w Reflex to ID Panel     Status: Abnormal   Collection Time: 11/20/22  6:09 AM   Specimen: BLOOD LEFT HAND  Result Value Ref Range Status   Specimen Description BLOOD LEFT HAND  Final   Special Requests   Final    BOTTLES DRAWN AEROBIC ONLY Blood Culture results may not be optimal due to an inadequate volume of blood received in culture bottles   Culture  Setup Time   Final    AEROBIC BOTTLE ONLY GRAM POSITIVE COCCI IN CLUSTERS CRITICAL VALUE NOTED.  VALUE IS CONSISTENT WITH PREVIOUSLY REPORTED AND CALLED VALUE.    Culture (A)  Final    STAPHYLOCOCCUS  AUREUS SUSCEPTIBILITIES PERFORMED ON PREVIOUS CULTURE WITHIN THE LAST 5 DAYS. Performed at [facility], [address], [place], [place] [address]    Report Status 11/21/2022 FINAL  Final  Culture, blood (Routine X 2) w Reflex to ID Panel     Status: None (Preliminary result)   Collection Time: 11/21/22 10:04 AM   Specimen: BLOOD LEFT HAND  Result Value Ref Range Status   Specimen Description BLOOD LEFT HAND  Final   Special Requests   Final    BOTTLES DRAWN AEROBIC AND ANAEROBIC Blood Culture adequate volume   Culture  Setup Time   Final    GRAM POSITIVE COCCI IN BOTH AEROBIC AND ANAEROBIC BOTTLES CRITICAL VALUE NOTED.  VALUE IS CONSISTENT WITH PREVIOUSLY REPORTED AND CALLED VALUE. Performed at [facility], [address], [place], [place] [address]    Culture GRAM POSITIVE COCCI  Final   Report Status PENDING  Incomplete  Culture, blood (Routine X 2) w Reflex to ID Panel     Status: Abnormal (Preliminary result)   Collection Time: 11/21/22 10:14 AM   Specimen: BLOOD  Result Value Ref Range Status   Specimen Description BLOOD RIGHT ANTECUBITAL  Final   Special Requests   Final    BOTTLES DRAWN AEROBIC ONLY Blood Culture adequate volume   Culture  Setup Time   Final    GRAM POSITIVE COCCI AEROBIC BOTTLE ONLY Gram Stain Report Called to,Read Back By and Verified With: PHARMD G. ABBOTT 11/21/22 @2321  BY AB    Culture (A)  Final    STAPHYLOCOCCUS AUREUS SUSCEPTIBILITIES TO FOLLOW Performed at [facility], [address] [place], [place]    Report Status PENDING  Incomplete  Culture, blood (Routine X 2) w Reflex to ID Panel     Status: None (Preliminary result)   Collection Time: 11/22/22 12:03 PM   Specimen: BLOOD LEFT HAND  Result Value Ref Range Status   Specimen Description BLOOD LEFT HAND  Final   Special Requests   Final    BOTTLES DRAWN AEROBIC AND ANAEROBIC Blood Culture adequate volume   Culture  Setup Time   Final    GRAM POSITIVE  COCCI IN CLUSTERS ANAEROBIC BOTTLE ONLY CRITICAL VALUE NOTED.  VALUE IS CONSISTENT WITH PREVIOUSLY REPORTED AND CALLED VALUE. Performed at [facility], [address], [place], [place]    Culture PENDING  Incomplete   Report Status PENDING  Incomplete  Culture, blood (Routine X 2) w Reflex to ID Panel     Status: None (Preliminary result)   Collection Time: 11/22/22 12:24 PM   Specimen: BLOOD RIGHT HAND  Result Value Ref Range Status   Specimen Description BLOOD RIGHT HAND  Final   Special Requests   Final    BOTTLES DRAWN AEROBIC AND ANAEROBIC Blood Culture adequate volume   Culture  Setup Time   Final    GRAM POSITIVE COCCI IN CLUSTERS ANAEROBIC BOTTLE ONLY Gram Stain Report Called to,Read Back By and Verified With: PHARMD LEDFORD 11/23/22 @ 0512 BY AB Performed at [facility], [address], [place], [place]    Culture PENDING  Incomplete   Report Status PENDING  Incomplete   Pertinent Lab.    Latest Ref Rng & Units 11/19/2022    3:16 AM 11/18/2022    8:28 AM 03/24/2020    4:37 AM  CBC  WBC 4.0 - 10.5 K/uL 15.6  15.6  20.3   Hemoglobin 12.0 - 15.0 g/dL 03/26/2020  82.9  93.7   Hematocrit 36.0 - 46.0 % 31.8  36.4  35.8   Platelets 150 - 400 K/uL 194  216  425       Latest Ref Rng & Units 11/20/2022    2:20 AM 11/19/2022    3:16 AM 11/18/2022    8:28 AM  CMP  Glucose 70 - 99 mg/dL 99  161149  096125   BUN 6 - 20 mg/dL 9  7  5    Creatinine 0.44 - 1.00 mg/dL 0.450.60  4.090.67  8.110.59   Sodium 135 - 145 mmol/L 134  134  130   Potassium 3.5 - 5.1 mmol/L 4.3  3.1  3.7   Chloride 98 - 111 mmol/L 102  101  97   CO2 22 - 32 mmol/L 23  24  23    Calcium 8.9 - 10.3 mg/dL 8.2  8.0  8.6   Total Protein 6.5 - 8.1 g/dL  5.6    Total Bilirubin 0.3 - 1.2 mg/dL  0.9    Alkaline Phos 38 - 126 U/L  162    AST 15 - 41 U/L  19    ALT 0 - 44 U/L  42       Pertinent Imaging today Plain films and CT images have been personally visualized and interpreted; radiology reports  have been reviewed. Decision making incorporated into the Impression / Recommendations.  MR FEMUR LEFT WO CONTRAST  Result Date: 11/22/2022 CLINICAL DATA:  Limping, acute thigh pain EXAM: MR OF THE LEFT FEMUR WITHOUT CONTRAST TECHNIQUE: Multiplanar, multisequence MR imaging of the left was performed. No intravenous contrast was administered. COMPARISON:  None Available. FINDINGS: The exam is significantly limited, only coronal T2 and T1 sequences were obtained. Patient motion limits evaluation IMPRESSION: Significantly limited exam due to patient motion. The exam is only coronal T2 and T1 sequences were obtained. Nondiagnostic examination. Electronically Signed   By: Larose HiresImran  Ahmed D.O.   On: 11/22/2022 15:33   MR SHOULDER LEFT WO CONTRAST  Result Date: 11/22/2022 CLINICAL DATA:  Shoulder pain. Septic arthritis suspected. History of polysubstance abuse and intravenous drug use. Probable pulmonary septic emboli on chest CT. EXAM: MRI OF THE LEFT SHOULDER WITHOUT CONTRAST TECHNIQUE: Multiplanar, multisequence MR imaging of the shoulder was performed. No intravenous contrast was administered. COMPARISON:  Chest CT 11/18/2022.  CT left elbow 03/23/2020. FINDINGS: Rotator cuff:  Intact without significant tendinosis. Muscles: No focal muscular atrophy. There is a moderate amount of ill-defined fluid posteriorly between the deltoid, infraspinatus and teres minor muscles. Edema extends into the deltoid muscle. No well-defined focal fluid collections are identified. Biceps long head:  Intact and normally positioned. Acromioclavicular Joint: The acromion is type 2. No significant abnormality of the acromioclavicular joint. No generalized fluid in the subacromial-subdeltoid bursa. Glenohumeral Joint: No significant shoulder joint effusion or glenohumeral arthropathy. Labrum: Labral assessment limited by the lack of joint fluid. No evidence of labral tear or paralabral cyst. Bones: No evidence of acute fracture,  dislocation or osteomyelitis. No erosive changes are identified. Other: As above, posterior intramuscular and intermuscular ill-defined fluid which could be infected. No focal fluid collection identified. IMPRESSION: 1. Moderate ill-defined fluid posteriorly between the deltoid, infraspinatus and teres minor muscles which could be infected. No well-defined focal fluid collection identified. 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 2. No evidence of septic arthritis or osteomyelitis. 3. The rotator cuff, biceps tendon and labrum appear intact. Electronically Signed   By: Carey BullocksWilliam  Veazey M.D.   On: 11/22/2022 14:25      I spent 55 minutes for this patient encounter including review of prior medical  records, coordination of care with primary/other specialist with greater than 50% of time being face to face/counseling and discussing diagnostics/treatment plan with the patient/family.  Electronically signed by:   Odette Fraction, MD Infectious Disease Physician [facility] Pager: [phone]

## 2022-11-23 NOTE — Progress Notes (Addendum)
Pharmacy Antibiotic Note  Cassandra Roberts is a 32 y.o. female admitted on 11/18/2022 with MRSA bacteremia, concern for pulmonary septic emboli, and disseminated infection. Pharmacy has been consulted to transition Vancomycin to Daptomycin + Ceftaroline in the setting of persistent bacteremia.   Noted Vancomycin levels likely suboptimal 1/11 - 1/15 AM, dose better optimized 1/15 PM with VP this morning reflective of higher concentrations at 26 mcg/ml. Will continue with Vancomycin trough today as planned despite antibiotic adjustments in case the need to transition back to Vancomycin arises in the future.   Will go ahead and transition to Daptomycin + Ceftaroline given persistent bacteremia and concern for disseminated infection. Still undergoing evaluation for optimal source control. Ceftaroline will provide adequate pulmonary coverage for now.   Plan: - D/c Vancomycin - however continue with random level @ 1230 today - Start Daptomycin 500 mg (~9 mg/kg) every 24 hours - Start Ceftaroline 600 mg IV every 8 hours - CK today and weekly, will continue to monitor renal function, repeat BCx for clearance  Height: 5\' 2"  (157.5 cm) Weight: 56.7 kg (125 lb) IBW/kg (Calculated) : 50.1  Temp (24hrs), Avg:98.1 F (36.7 C), Min:97.8 F (36.6 C), Max:98.6 F (37 C)  Recent Labs  Lab 11/18/22 0828 11/18/22 1303 11/19/22 0316 11/20/22 0220 11/22/22 1203 11/23/22 0809  WBC 15.6*  --  15.6*  --   --   --   CREATININE 0.59  --  0.67 0.60  --  0.50  LATICACIDVEN  --  1.4  --   --   --   --   VANCOPEAK  --   --   --   --  10* 26*     Estimated Creatinine Clearance: 80.6 mL/min (by C-G formula based on SCr of 0.5 mg/dL).    Allergies  Allergen Reactions   Dextromethorphan     Shaking    Antimicrobials this admission: Cefepime 1/11 x 1 Vanc 1/11 >> 1/16 Daptomycin 1/16 >> Ceftaroline 1/16 >>  Dose adjustments this admission: 1/15 Vanc peak 10 (measured), corrected to 12 on Vanc 750 mg/12h   >> adjust to 1g/8h 1/16 VP 26, VT TBD  Microbiology results: 1/11 BCx >> 1/2 MRSA 1/12 BCx >> ngx3d 1/13 BCx 2/2 MRSA 1/14 BCx >> 3/3 MRSA 1/15 BCx >> 2/2 MRSA 1/16 BCx >> sent  Thank you for allowing pharmacy to be a part of this patient's care.  Alycia Rossetti, PharmD, BCPS Infectious Diseases Clinical Pharmacist 11/23/2022 9:23 AM   **Pharmacist phone directory can now be found on [email or web address] ([email or web address]).  Listed under [place].

## 2022-11-23 NOTE — Consult Note (Signed)
ORTHOPAEDIC CONSULTATION  REQUESTING PHYSICIAN: Charlynne Cousins, MD  Chief Complaint: Bilateral shoulder, left hip pain  HPI: Cassandra Roberts is a 32 y.o. female who presents with a history of intravenous drug use presents with bilateral shoulder and left hip pain.  She states that there is really more soreness and an ache as opposed to pain with range of motion.  She was admitted 5 days prior with pneumonia and bacteremia which is being treated by the medical and infectious disease team.  She does have a history of staph.  Past Medical History:  Diagnosis Date   Attention deficit disorder (ADD)    Hepatitis C    Substance abuse ([place])    Past Surgical History:  Procedure Laterality Date   APPENDECTOMY     MINOR IRRIGATION AND DEBRIDEMENT OF WOUND Left 03/23/2020   Procedure: MINOR IRRIGATION AND DEBRIDEMENT OF WOUND;  Surgeon: Mcarthur Rossetti, MD;  Location: [facility];  Service: Orthopedics;  Laterality: Left;   Social History   Socioeconomic History   Marital status: Single    Spouse name: Not on file   Number of children: Not on file   Years of education: Not on file   Highest education level: Not on file  Occupational History   Occupation: waitress  Tobacco Use   Smoking status: Every Day    Packs/day: 0.25    Years: 2.00    Total pack years: 0.50    Types: Cigarettes   Smokeless tobacco: Never  Vaping Use   Vaping Use: Every day  Substance and Sexual Activity   Alcohol use: Not Currently    Comment: daily   Drug use: Yes    Frequency: 1.0 times per week    Types: Marijuana, Cocaine, Amphetamines, Fentanyl    Comment: Fentanyl   Sexual activity: Not Currently    Birth control/protection: None  Other Topics Concern   Not on file  Social History Narrative   Marital status: single      Employment: Educational psychologist      Tobacco: daily      Alcohol: socially      Drugs: marijuana       Social Determinants of Radio broadcast assistant Strain: Not on  file  Food Insecurity: No Food Insecurity (11/18/2022)   Hunger Vital Sign    Worried About Running Out of Food in the Last Year: Never true    Ran Out of Food in the Last Year: Never true  Transportation Needs: No Transportation Needs (11/18/2022)   PRAPARE - Hydrologist (Medical): No    Lack of Transportation (Non-Medical): No  Physical Activity: Not on file  Stress: Not on file  Social Connections: Not on file   Family History  Problem Relation Age of Onset   Heart attack Mother    - negative except otherwise stated in the family history section Allergies  Allergen Reactions   Dextromethorphan     Shaking   Prior to Admission medications   Medication Sig Start Date End Date Taking? Authorizing Provider  acetaminophen (TYLENOL) 325 MG tablet Take 325 mg by mouth every 6 (six) hours as needed for fever.   Yes [provider]  ibuprofen (ADVIL) 200 MG tablet Take 200 mg by mouth every 6 (six) hours as needed for moderate pain.   Yes [provider]  linezolid (ZYVOX) 600 MG tablet Take 1 tablet (600 mg total) by mouth 2 (two) times daily. 11/19/22 12/31/22 Yes Tommy Medal,  Lisette Grinder, MD  benzonatate (TESSALON) 100 MG capsule Take 1 capsule (100 mg total) by mouth every 8 (eight) hours. Patient not taking: Reported on 03/23/2020 07/02/15   Mirian Mo, MD  doxycycline (VIBRAMYCIN) 100 MG capsule Take 1 capsule (100 mg total) by mouth 2 (two) times daily. One po bid x 7 days Patient not taking: Reported on 03/23/2020 12/28/19   Pricilla Loveless, MD  ibuprofen (ADVIL,MOTRIN) 800 MG tablet Take 1 tablet (800 mg total) by mouth 3 (three) times daily. Patient not taking: Reported on 06/15/2015 10/22/13   Roxy Horseman, PA-C  metroNIDAZOLE (FLAGYL) 500 MG tablet Take 1 tablet (500 mg total) by mouth 2 (two) times daily. One po bid x 7 days Patient not taking: Reported on 03/23/2020 07/02/15   Mirian Mo, MD  naloxone [facility]) 1 MG/ML injection  Inject 0.4 mLs (0.4 mg total) into the vein as needed. Patient not taking: Reported on 03/23/2020 06/15/15   Cy Blamer, MD   MR HIP LEFT W WO CONTRAST  Result Date: 11/23/2022 CLINICAL DATA:  Septic arthritis suspected. EXAM: MRI OF THE LEFT HIP WITHOUT AND WITH CONTRAST TECHNIQUE: Multiplanar, multisequence MR imaging was performed both before and after administration of intravenous contrast. CONTRAST:  68mL GADAVIST GADOBUTROL 1 MMOL/ML IV SOLN COMPARISON:  Limited MRI bilateral femurs (patient was unable to complete exam) 11/22/2022 FINDINGS: Bones: Mild pubic symphysis joint space narrowing and peripheral osteophytosis. No acute fracture or avascular necrosis is seen within the visualized portions of the pelvis or either proximal femur. There is decreased T1 signal seen diffusely throughout the ilium, ischium, and pubis of the pelvis. This remains hyperintense to skeletal muscle. This is nonspecific but compatible with red marrow reconversion as can be seen in the settings of obesity, anemia, and/or smoking along with other etiologies. No cortical erosion is seen. Articular cartilage and labrum Articular cartilage: Mild thinning of the superior left acetabular and femoral head cartilage. Labrum: Lack of intra-articular fluid limits evaluation of the left acetabular labrum. No dedicated left hip axial fluid sensitive images were performed. No definitive labral tear is seen on the provided images. Joint or bursal effusion Joint effusion:  No joint effusion within either hip. Bursae: No trochanteric bursitis on either side. Muscles and tendons Muscles and tendons: The origins of the bilateral rectus femoris and common hamstring tendons are intact. The insertions of the bilateral gluteus minimus, gluteus medius, and iliopsoas tendons are intact. There is mild-to-moderate fluid tracking along the anterior aspect of the left iliopsoas junction, possibly within the left iliopsoas bursa (axial series 5, image 21,  left hip coronal series 7, image 19. No definite enhancement in this region. This is nonspecific and may represent iliopsoas bursitis, however this edema does extend more superiorly, superficial to the anterior aspect of the left iliacus muscle (axial series 5 images 1 through 12, and in this region there is thin peripheral enhancement and possible multiple tiny pockets of rim enhancing fluid that could represent tiny abscesses (axial series 10 images 4 through 16). Recommend clinical correlation. There is also decreased T1 increased T2 signal likely fluid with multiple thin internal septations and some internal loculated components, especially anteriorly (coronal series 3, images 7 through 14 and axial series 5 images 12 through 16) centered deep to the right pelvic sidewall musculature. No postcontrast images were performed including this region. Given the indication, it is difficult to exclude an abscess in this region. The dominant portion of this measures up to approximately 1.6 x 3.3 x 4.4 cm (transverse  by AP by craniocaudal). This process also extends mildly more inferiorly and anteriorly, just deep to the inferior aspect of the right superior pubic ramus (axial series 5 images 16 through 19, coronal series 3 images 13 through 16). Other findings Miscellaneous: Mild free fluid within the pelvis, within normal limits for a premenopausal female. Normal physiologic follicles are seen within the bilateral ovaries. IMPRESSION: 1. There is mild-to-moderate fluid tracking along the anterior aspect of the left iliopsoas junction, possibly within the left iliopsoas bursa. No definite peripheral enhancement in this region. This is nonspecific and may represent iliopsoas bursitis, however this mild fluid/edema does extend more superiorly, superficial to the anterior aspect of the left iliacus muscle. There is thin peripheral enhancement and possible multiple tiny pockets of rim enhancing fluid that could represent tiny  abscesses anterior to the left iliacus muscle. 2. Additional fluid signal with multiple thin internal septations and loculations centered in between the right acetabulum and the right pelvic sidewall musculature. Noting the indication for this study, MRI cannot determine whether this fluid is infected. 3. No bone cortical erosion or marrow edema to indicate evidence of acute osteomyelitis. Electronically Signed   By: Yvonne Kendall M.D.   On: 11/23/2022 11:58   MR FEMUR LEFT WO CONTRAST  Result Date: 11/22/2022 CLINICAL DATA:  Limping, acute thigh pain EXAM: MR OF THE LEFT FEMUR WITHOUT CONTRAST TECHNIQUE: Multiplanar, multisequence MR imaging of the left was performed. No intravenous contrast was administered. COMPARISON:  None Available. FINDINGS: The exam is significantly limited, only coronal T2 and T1 sequences were obtained. Patient motion limits evaluation IMPRESSION: Significantly limited exam due to patient motion. The exam is only coronal T2 and T1 sequences were obtained. Nondiagnostic examination. Electronically Signed   By: Keane Police D.O.   On: 11/22/2022 15:33   MR SHOULDER LEFT WO CONTRAST  Result Date: 11/22/2022 CLINICAL DATA:  Shoulder pain. Septic arthritis suspected. History of polysubstance abuse and intravenous drug use. Probable pulmonary septic emboli on chest CT. EXAM: MRI OF THE LEFT SHOULDER WITHOUT CONTRAST TECHNIQUE: Multiplanar, multisequence MR imaging of the shoulder was performed. No intravenous contrast was administered. COMPARISON:  Chest CT 11/18/2022.  CT left elbow 03/23/2020. FINDINGS: Rotator cuff:  Intact without significant tendinosis. Muscles: No focal muscular atrophy. There is a moderate amount of ill-defined fluid posteriorly between the deltoid, infraspinatus and teres minor muscles. Edema extends into the deltoid muscle. No well-defined focal fluid collections are identified. Biceps long head:  Intact and normally positioned. Acromioclavicular Joint: The  acromion is type 2. No significant abnormality of the acromioclavicular joint. No generalized fluid in the subacromial-subdeltoid bursa. Glenohumeral Joint: No significant shoulder joint effusion or glenohumeral arthropathy. Labrum: Labral assessment limited by the lack of joint fluid. No evidence of labral tear or paralabral cyst. Bones: No evidence of acute fracture, dislocation or osteomyelitis. No erosive changes are identified. Other: As above, posterior intramuscular and intermuscular ill-defined fluid which could be infected. No focal fluid collection identified. IMPRESSION: 1. Moderate ill-defined fluid posteriorly between the deltoid, infraspinatus and teres minor muscles which could be infected. No well-defined focal fluid collection identified. 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 2. No evidence of septic arthritis or osteomyelitis. 3. The rotator cuff, biceps tendon and labrum appear intact. Electronically Signed   By: Richardean Sale M.D.   On: 11/22/2022 14:25     Positive ROS: All other systems have been reviewed and were otherwise negative with the exception of those mentioned in the HPI and as above.  Physical Exam: General:  No acute distress Cardiovascular: No pedal edema Respiratory: No cyanosis, no use of accessory musculature GI: No organomegaly, abdomen is soft and non-tender Skin: No lesions in the area of chief complaint Neurologic: Sensation intact distally Psychiatric: Patient is at baseline mood and affect Lymphatic: No axillary or cervical lymphadenopathy  MUSCULOSKELETAL:  Tenderness about the lateral deltoid of the left shoulder.  She has active forward elevation to 170 degrees with external rotation at side to 45 degrees.  No pain with internal rotation.  With regard to the left hip she has 30 degrees of internal and external rotation without any pain.  No pain with resisted abduction.  There is some tenderness tracking along the psoas area  Independent Imaging  Review: MRI left shoulder, left hip: There is some nonspecific fluid tracking extra-articular along the posterior aspect of the infraspinatus as well as in the psoas sheath.  Assessment: 32 year old female with nonspecific fluid tracking along the posterior rotator cuff as well as left iliopsoas tendon.  I do not believe that there is any specific fluid collection that could be aspirated.  At this time I am not concerned about a septic joint of either the left hip of the left shoulder.  This is essentially ruled out.  At this time I recommend continued treatment with IV antibiotics.  No surgical intervention required at this time.   Thank you for the consult and the opportunity to see Ms. Whidden  Huel Cote, MD [facility] 1:57 PM

## 2022-11-23 NOTE — Progress Notes (Signed)
Order for aspiration within soft tissues of left shoulder.  Dr. Chancy Milroy reviewed imaging who reports though evidence of edema, no definitive fluid collection to target at this time.  No intervention to offer.  Dr. Aileen Fass notified.    Electronically Signed: Pasty Spillers, PA-C 11/23/2022, 10:27 AM

## 2022-11-23 NOTE — Consult Note (Signed)
Reason for Consult:Left shoulder infection Referring Physician: Archer Asa Time called: 0900 Time at bedside: 1030   Cassandra Roberts is an 32 y.o. female.  HPI: Cassandra Roberts was admitted 5d ago with PNA and bacteremia. She was also began to have left shoulder pain on admission. At the time she could barely raise her arm but states her shoulder symptoms are about 90% improved and she has minimal pain. She denies prior hx/o similar. 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  Past Medical History:  Diagnosis Date   Attention deficit disorder (ADD)    Hepatitis C    Substance abuse ([facility])     Past Surgical History:  Procedure Laterality Date   APPENDECTOMY     MINOR IRRIGATION AND DEBRIDEMENT OF WOUND Left 03/23/2020   Procedure: MINOR IRRIGATION AND DEBRIDEMENT OF WOUND;  Surgeon: Kathryne Hitch, MD;  Location: [facility];  Service: Orthopedics;  Laterality: Left;    Family History  Problem Relation Age of Onset   Heart attack Mother     Social History:  reports that she has been smoking cigarettes. She has a 0.50 pack-year smoking history. She has never used smokeless tobacco. She reports that she does not currently use alcohol. She reports current drug use. Frequency: 1.00 time per week. Drugs: Marijuana, Cocaine, Amphetamines, and Fentanyl.  Allergies:  Allergies  Allergen Reactions   Dextromethorphan     Shaking    Medications: I have reviewed the patient's current medications.  Results for orders placed or performed during the hospital encounter of 11/18/22 (from the past 48 hour(s))  Vancomycin, peak     Status: Abnormal   Collection Time: 11/22/22 12:03 PM  Result Value Ref Range   Vancomycin Pk 10 (L) 30 - 40 ug/mL    Comment: Performed at [facility], [address] [place], [place] [address]  Culture, blood (Routine X 2) w Reflex to ID Panel     Status: None (Preliminary result)   Collection Time: 11/22/22 12:03 PM   Specimen: BLOOD LEFT HAND  Result  Value Ref Range   Specimen Description BLOOD LEFT HAND    Special Requests      BOTTLES DRAWN AEROBIC AND ANAEROBIC Blood Culture adequate volume   Culture  Setup Time      GRAM POSITIVE COCCI IN CLUSTERS ANAEROBIC BOTTLE ONLY CRITICAL VALUE NOTED.  VALUE IS CONSISTENT WITH PREVIOUSLY REPORTED AND CALLED VALUE. Performed at [facility], [address], [place], [place] [address]    Culture PENDING    Report Status PENDING   Culture, blood (Routine X 2) w Reflex to ID Panel     Status: None (Preliminary result)   Collection Time: 11/22/22 12:24 PM   Specimen: BLOOD RIGHT HAND  Result Value Ref Range   Specimen Description BLOOD RIGHT HAND    Special Requests      BOTTLES DRAWN AEROBIC AND ANAEROBIC Blood Culture adequate volume   Culture  Setup Time      GRAM POSITIVE COCCI IN CLUSTERS ANAEROBIC BOTTLE ONLY Gram Stain Report Called to,Read Back By and Verified With: PHARMD LEDFORD 11/23/22 @ 0512 BY AB Performed at [facility], [address], [place], [place] [address]    Culture PENDING    Report Status PENDING   Vancomycin, peak     Status: Abnormal   Collection Time: 11/23/22  8:09 AM  Result Value Ref Range   Vancomycin Pk 26 (L) 30 - 40 ug/mL    Comment:  Performed at [facility], [place] [address] [place], [place] [address]  Basic metabolic panel     Status: Abnormal   Collection Time: 11/23/22  8:09 AM  Result Value Ref Range   Sodium 135 135 - 145 mmol/L   Potassium 4.3 3.5 - 5.1 mmol/L    Comment: HEMOLYSIS AT THIS LEVEL MAY AFFECT RESULT   Chloride 102 98 - 111 mmol/L   CO2 24 22 - 32 mmol/L   Glucose, Bld 101 (H) 70 - 99 mg/dL    Comment: Glucose reference range applies only to samples taken after fasting for at least 8 hours.   BUN 6 6 - 20 mg/dL   Creatinine, Ser 0.50 0.44 - 1.00 mg/dL   Calcium 7.7 (L) 8.9 - 10.3 mg/dL   GFR, Estimated >60 >60 mL/min    Comment: (NOTE) Calculated using the CKD-EPI Creatinine Equation (2021)    Anion gap  9 5 - 15    Comment: Performed at [facility] [address] [place], [place] [address]    MR FEMUR LEFT WO CONTRAST  Result Date: 11/22/2022 CLINICAL DATA:  Limping, acute thigh pain EXAM: MR OF THE LEFT FEMUR WITHOUT CONTRAST TECHNIQUE: Multiplanar, multisequence MR imaging of the left was performed. No intravenous contrast was administered. COMPARISON:  None Available. FINDINGS: The exam is significantly limited, only coronal T2 and T1 sequences were obtained. Patient motion limits evaluation IMPRESSION: Significantly limited exam due to patient motion. The exam is only coronal T2 and T1 sequences were obtained. Nondiagnostic examination. Electronically Signed   By: Keane Police D.O.   On: 11/22/2022 15:33   MR SHOULDER LEFT WO CONTRAST  Result Date: 11/22/2022 CLINICAL DATA:  Shoulder pain. Septic arthritis suspected. History of polysubstance abuse and intravenous drug use. Probable pulmonary septic emboli on chest CT. EXAM: MRI OF THE LEFT SHOULDER WITHOUT CONTRAST TECHNIQUE: Multiplanar, multisequence MR imaging of the shoulder was performed. No intravenous contrast was administered. COMPARISON:  Chest CT 11/18/2022.  CT left elbow 03/23/2020. FINDINGS: Rotator cuff:  Intact without significant tendinosis. Muscles: No focal muscular atrophy. There is a moderate amount of ill-defined fluid posteriorly between the deltoid, infraspinatus and teres minor muscles. Edema extends into the deltoid muscle. No well-defined focal fluid collections are identified. Biceps long head:  Intact and normally positioned. Acromioclavicular Joint: The acromion is type 2. No significant abnormality of the acromioclavicular joint. No generalized fluid in the subacromial-subdeltoid bursa. Glenohumeral Joint: No significant shoulder joint effusion or glenohumeral arthropathy. Labrum: Labral assessment limited by the lack of joint fluid. No evidence of labral tear or paralabral cyst. Bones: No evidence of acute  fracture, dislocation or osteomyelitis. No erosive changes are identified. Other: As above, posterior intramuscular and intermuscular ill-defined fluid which could be infected. No focal fluid collection identified. IMPRESSION: 1. Moderate ill-defined fluid posteriorly between the deltoid, infraspinatus and teres minor muscles which could be infected. No well-defined focal fluid collection identified. 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 2. No evidence of septic arthritis or osteomyelitis. 3. The rotator cuff, biceps tendon and labrum appear intact. Electronically Signed   By: Richardean Sale M.D.   On: 11/22/2022 14:25    Review of Systems  HENT:  Negative for ear discharge, ear pain, hearing loss and tinnitus.   Eyes:  Negative for photophobia and pain.  Respiratory:  Negative for cough and shortness of breath.   Cardiovascular:  Negative for chest pain.  Gastrointestinal:  Negative for abdominal pain, nausea and vomiting.  Genitourinary:  Negative for  dysuria, flank pain, frequency and urgency.  Musculoskeletal:  Positive for arthralgias (Left shoulder, minimal). Negative for back pain, myalgias and neck pain.  Neurological:  Negative for dizziness and headaches.  Hematological:  Does not bruise/bleed easily.  Psychiatric/Behavioral:  The patient is not nervous/anxious.    Blood pressure 126/74, pulse 76, temperature 98 F (36.7 C), temperature source Oral, resp. rate 16, height 5\' 2"  (1.575 m), weight 56.7 kg, last menstrual period 11/04/2022, SpO2 96 %. Physical Exam Constitutional:      General: She is not in acute distress.    Appearance: She is well-developed. She is not diaphoretic.  HENT:     Head: Normocephalic and atraumatic.  Eyes:     General: No scleral icterus.       Right eye: No discharge.        Left eye: No discharge.     Conjunctiva/sclera: Conjunctivae normal.  Cardiovascular:     Rate and Rhythm: Normal rate and regular rhythm.  Pulmonary:     Effort: Pulmonary  effort is normal. No respiratory distress.  Musculoskeletal:     Cervical back: Normal range of motion.     Comments: Left shoulder, elbow, wrist, digits- no skin wounds, minimal TTP posteriors shoulder near axilla, no instability, no blocks to motion, normal painless ROM even against resistance  Sens  Ax/R/M/U intact  Mot   Ax/ R/ PIN/ M/ AIN/ U intact  Rad 2+  Skin:    General: Skin is warm and dry.  Neurological:     Mental Status: She is alert.  Psychiatric:        Mood and Affect: Mood normal.        Behavior: Behavior normal.     Assessment/Plan: Left shoulder infection -- Given fairly rapid improvement in symptoms coupled with minimal current symptoms do not think operative intervention indicated. Would continue present management.    Lisette Abu, PA-C Orthopedic Surgery [phone] 11/23/2022, 10:41 AM

## 2022-11-23 NOTE — Progress Notes (Addendum)
[facility] PROGRESS NOTE    Progress Note  Erisha Paugh  JGG:[id number] DOB: [birth year] DOA: 11/18/2022 PCP: Patient, No Pcp Per     Brief Narrative:   Galadriel Shroff is an 32 y.o. 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   ID was consulted recommended TEE on Tuesday. Blood cultures x 3 continue to be positive for staph Aureus last one was on 11/21/2022. Tmax of 100.8.   MRI showed fluid posteriorly to the deltoid, infectious ease recommended to get orthopedic involved for possible arthrocentesis, orthopedic recommended IR consult for intervention. ID also recommended a MRI of the left hip as she is complaining of left hip pain. TEE on 1.17.2024  Polysubstance abuse (HCC)/ IVDU (intravenous drug user) Continue on MS Contin.    DVT prophylaxis: lovenox Family Communication:none Status is: Inpatient Remains inpatient appropriate because: MRSA bacteremia    Code Status:     Code Status Orders  (From admission, onward)           Start     Ordered   11/18/22 1623  Full code  Continuous       Question:  By:  Answer:  Consent: discussion documented in EHR   11/18/22 1625           Code Status History     Date Active Date Inactive Code Status Order ID Comments User Context   03/23/2020 1622 03/25/2020 0102 Full Code [id number]  Dede Query, MD Inpatient   08/21/2013 1049  08/21/2013 2156 Full Code [id number]  Arnoldo Hooker, PA-C ED         IV Access:   Peripheral IV   Procedures and diagnostic studies:   MR FEMUR LEFT WO CONTRAST  Result Date: 11/22/2022 CLINICAL DATA:  Limping, acute thigh pain EXAM: MR OF THE LEFT FEMUR WITHOUT CONTRAST TECHNIQUE: Multiplanar, multisequence MR imaging of the left was performed. No intravenous contrast was administered. COMPARISON:  None Available. FINDINGS: The exam is significantly limited, only coronal T2 and T1 sequences were obtained. Patient motion limits evaluation IMPRESSION: Significantly limited exam due to patient motion. The exam is only coronal T2 and T1 sequences were obtained. Nondiagnostic examination. Electronically Signed   By: Larose Hires D.O.   On: 11/22/2022 15:33   MR SHOULDER LEFT WO CONTRAST  Result Date: 11/22/2022 CLINICAL DATA:  Shoulder pain. Septic arthritis suspected. History of polysubstance abuse and intravenous drug use. Probable pulmonary septic emboli on chest CT. EXAM: MRI OF THE LEFT SHOULDER WITHOUT CONTRAST TECHNIQUE: Multiplanar, multisequence MR imaging of the shoulder was performed. No intravenous contrast was administered. COMPARISON:  Chest CT 11/18/2022.  CT left elbow 03/23/2020. FINDINGS: Rotator cuff:  Intact without significant tendinosis. Muscles: No focal muscular atrophy. There is a moderate amount of ill-defined fluid posteriorly between the deltoid, infraspinatus and teres minor muscles. Edema extends into the deltoid muscle. No well-defined focal fluid collections are identified. Biceps long head:  Intact and normally positioned. Acromioclavicular Joint: The acromion is type  2. No significant abnormality of the acromioclavicular joint. No generalized fluid in the subacromial-subdeltoid bursa. Glenohumeral Joint: No significant shoulder joint effusion or glenohumeral arthropathy. Labrum: Labral assessment limited by the lack of joint fluid. No evidence of labral tear or  paralabral cyst. Bones: No evidence of acute fracture, dislocation or osteomyelitis. No erosive changes are identified. Other: As above, posterior intramuscular and intermuscular ill-defined fluid which could be infected. No focal fluid collection identified. IMPRESSION: 1. Moderate ill-defined fluid posteriorly between the deltoid, infraspinatus and teres minor muscles which could be infected. No well-defined focal fluid collection identified. 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 2. No evidence of septic arthritis or osteomyelitis. 3. The rotator cuff, biceps tendon and labrum appear intact. Electronically Signed   By: Richardean Sale M.D.   On: 11/22/2022 14:25     Medical Consultants:   None.   Subjective:    Nick Masullo relates her pain is okay.  ObO2  jective:    Vitals:   11/22/22 2011 11/23/22 0013 11/23/22 0455 11/23/22 0827  BP: 122/87 127/85 114/75 126/74  Pulse: 100 82 76   Resp: 18 18 16    Temp: 98.6 F (37 C) 97.8 F (36.6 C) 98 F (36.7 C)   TempSrc: Oral Oral Oral   SpO2: 97% 97% 96%   Weight:      Height:       SpO2: 96 % O2 Flow Rate (L/min): 2 L/min   Intake/Output Summary (Last 24 hours) at 11/23/2022 0951 Last data filed at 11/22/2022 1603 Gross per 24 hour  Intake 1250 ml  Output --  Net 1250 ml    Filed Weights   11/18/22 0805  Weight: 56.7 kg    Exam: General exam: In no acute distress. Respiratory system: Good air movement and clear to auscultation. Cardiovascular system: S1 & S2 heard, RRR. No JVD. Gastrointestinal system: Abdomen is nondistended, soft and nontender.  Extremities: No pedal edema. Skin: No rashes, lesions or ulcers Psychiatry: Judgement and insight appear normal. Mood & affect appropriate.  Data Reviewed:    Labs: Basic Metabolic Panel: Recent Labs  Lab 11/18/22 0828 11/19/22 0316 11/20/22 0220 11/23/22 0809  NA 130* 134* 134* 135  K 3.7 3.1* 4.3 4.3  CL 97* 101 102 102  CO2 23 24 23 24   GLUCOSE 125*  149* 99 101*  BUN 5* 7 9 6   CREATININE 0.59 0.67 0.60 0.50  CALCIUM 8.6* 8.0* 8.2* 7.7*    GFR Estimated Creatinine Clearance: 80.6 mL/min (by C-G formula based on SCr of 0.5 mg/dL). Liver Function Tests: Recent Labs  Lab 11/19/22 0316  AST 19  ALT 42  ALKPHOS 162*  BILITOT 0.9  PROT 5.6*  ALBUMIN 2.3*    No results for input(s): "LIPASE", "AMYLASE" in the last 168 hours. No results for input(s): "AMMONIA" in the last 168 hours. Coagulation profile No results for input(s): "INR", "PROTIME" in the last 168 hours. COVID-19 Labs  No results for input(s): "DDIMER", "FERRITIN", "LDH", "CRP" in the last 72 hours.  Lab Results  Component Value Date   SARSCOV2NAA NEGATIVE 11/18/2022   [place] NEGATIVE 03/23/2020    CBC: Recent Labs  Lab 11/18/22 0828 11/19/22 0316  WBC 15.6* 15.6*  HGB 12.6 11.2*  HCT 36.4 31.8*  MCV 87.3 85.9  PLT 216 194    Cardiac Enzymes: No results for input(s): "CKTOTAL", "CKMB", "CKMBINDEX", "TROPONINI" in the last 168 hours. BNP (last 3 results) No results for input(s): "PROBNP" in the last 8760 hours. CBG: No results for input(s): "  GLUCAP" in the last 168 hours. D-Dimer: No results for input(s): "DDIMER" in the last 72 hours. Hgb A1c: No results for input(s): "HGBA1C" in the last 72 hours. Lipid Profile: No results for input(s): "CHOL", "HDL", "LDLCALC", "TRIG", "CHOLHDL", "LDLDIRECT" in the last 72 hours. Thyroid function studies: No results for input(s): "TSH", "T4TOTAL", "T3FREE", "THYROIDAB" in the last 72 hours.  Invalid input(s): "FREET3" Anemia work up: No results for input(s): "VITAMINB12", "FOLATE", "FERRITIN", "TIBC", "IRON", "RETICCTPCT" in the last 72 hours. Sepsis Labs: Recent Labs  Lab 11/18/22 0828 11/18/22 1303 11/19/22 0316  WBC 15.6*  --  15.6*  LATICACIDVEN  --  1.4  --     Microbiology Recent Results (from the past 240 hour(s))  Resp panel by RT-PCR (RSV, Flu A&B, Covid) Anterior Nasal Swab      Status: None   Collection Time: 11/18/22 10:08 AM   Specimen: Anterior Nasal Swab  Result Value Ref Range Status   SARS Coronavirus 2 by RT PCR NEGATIVE NEGATIVE Final    Comment: (NOTE) SARS-CoV-2 target nucleic acids are NOT DETECTED.  The SARS-CoV-2 RNA is generally detectable in upper respiratory specimens during the acute phase of infection. The lowest concentration of SARS-CoV-2 viral copies this assay can detect is 138 copies/mL. A negative result does not preclude SARS-Cov-2 infection and should not be used as the sole basis for treatment or other patient management decisions. A negative result may occur with  improper specimen collection/handling, submission of specimen other than nasopharyngeal swab, presence of viral mutation(s) within the areas targeted by this assay, and inadequate number of viral copies(<138 copies/mL). A negative result must be combined with clinical observations, patient history, and epidemiological information. The expected result is Negative.  Fact Sheet for Patients:  BloggerCourse.comhttps://www.fda.gov/media/152166/download  Fact Sheet for Healthcare Providers:  SeriousBroker.ithttps://www.fda.gov/media/152162/download  This test is no t yet approved or cleared by the [place] FDA and  has been authorized for detection and/or diagnosis of SARS-CoV-2 by FDA under an Emergency Use Authorization (EUA). This EUA will remain  in effect (meaning this test can be used) for the duration of the COVID-19 declaration under Section 564(b)(1) of the Act, 21 U.S.C.section 360bbb-3(b)(1), unless the authorization is terminated  or revoked sooner.       Influenza A by PCR NEGATIVE NEGATIVE Final   Influenza B by PCR NEGATIVE NEGATIVE Final    Comment: (NOTE) The Xpert Xpress SARS-CoV-2/FLU/RSV plus assay is intended as an aid in the diagnosis of influenza from Nasopharyngeal swab specimens and should not be used as a sole basis for treatment. Nasal washings and aspirates are  unacceptable for Xpert Xpress SARS-CoV-2/FLU/RSV testing.  Fact Sheet for Patients: BloggerCourse.comhttps://www.fda.gov/media/152166/download  Fact Sheet for Healthcare Providers: SeriousBroker.ithttps://www.fda.gov/media/152162/download  This test is not yet approved or cleared by the [place] FDA and has been authorized for detection and/or diagnosis of SARS-CoV-2 by FDA under an Emergency Use Authorization (EUA). This EUA will remain in effect (meaning this test can be used) for the duration of the COVID-19 declaration under Section 564(b)(1) of the Act, 21 U.S.C. section 360bbb-3(b)(1), unless the authorization is terminated or revoked.     Resp Syncytial Virus by PCR NEGATIVE NEGATIVE Final    Comment: (NOTE) Fact Sheet for Patients: BloggerCourse.comhttps://www.fda.gov/media/152166/download  Fact Sheet for Healthcare Providers: SeriousBroker.ithttps://www.fda.gov/media/152162/download  This test is not yet approved or cleared by the [place] FDA and has been authorized for detection and/or diagnosis of SARS-CoV-2 by FDA under an Emergency Use Authorization (EUA). This EUA will remain in effect (meaning this  test can be used) for the duration of the COVID-19 declaration under Section 564(b)(1) of the Act, 21 U.S.C. section 360bbb-3(b)(1), unless the authorization is terminated or revoked.  Performed at [facility], [address] [place], [place] [address]   Blood culture (routine x 2)     Status: Abnormal   Collection Time: 11/18/22 12:07 PM   Specimen: BLOOD  Result Value Ref Range Status   Specimen Description BLOOD SITE NOT SPECIFIED  Final   Special Requests   Final    BOTTLES DRAWN AEROBIC AND ANAEROBIC Blood Culture adequate volume   Culture  Setup Time   Final    GRAM POSITIVE COCCI IN BOTH AEROBIC AND ANAEROBIC BOTTLES CRITICAL RESULT CALLED TO, READ BACK BY AND VERIFIED WITH: PHARMD G. ABBOTT 11/19/22 @ 338 BY AB Performed at [facility], [address], [place], [place] [address]    Culture  METHICILLIN RESISTANT STAPHYLOCOCCUS AUREUS (A)  Final   Report Status 11/21/2022 FINAL  Final   Organism ID, Bacteria METHICILLIN RESISTANT STAPHYLOCOCCUS AUREUS  Final      Susceptibility   Methicillin resistant staphylococcus aureus - MIC*    CIPROFLOXACIN >=8 RESISTANT Resistant     ERYTHROMYCIN >=8 RESISTANT Resistant     GENTAMICIN <=0.5 SENSITIVE Sensitive     OXACILLIN >=4 RESISTANT Resistant     TETRACYCLINE <=1 SENSITIVE Sensitive     VANCOMYCIN <=0.5 SENSITIVE Sensitive     TRIMETH/SULFA <=10 SENSITIVE Sensitive     CLINDAMYCIN >=8 RESISTANT Resistant     RIFAMPIN <=0.5 SENSITIVE Sensitive     Inducible Clindamycin NEGATIVE Sensitive     * METHICILLIN RESISTANT STAPHYLOCOCCUS AUREUS  Blood Culture ID Panel (Reflexed)     Status: Abnormal   Collection Time: 11/18/22 12:07 PM  Result Value Ref Range Status   Enterococcus faecalis NOT DETECTED NOT DETECTED Final   Enterococcus Faecium NOT DETECTED NOT DETECTED Final   Listeria monocytogenes NOT DETECTED NOT DETECTED Final   Staphylococcus species DETECTED (A) NOT DETECTED Final    Comment: CRITICAL RESULT CALLED TO, READ BACK BY AND VERIFIED WITH: PHARMD G. ABBOTT 11/19/22 @ 338 BY AB    Staphylococcus aureus (BCID) DETECTED (A) NOT DETECTED Final    Comment: Methicillin (oxacillin)-resistant Staphylococcus aureus (MRSA). MRSA is predictably resistant to beta-lactam antibiotics (except ceftaroline). Preferred therapy is vancomycin unless clinically contraindicated. Patient requires contact precautions if  hospitalized. CRITICAL RESULT CALLED TO, READ BACK BY AND VERIFIED WITH: PHARMD G. ABBOTT 11/19/22 @ 338 BY AB    Staphylococcus epidermidis NOT DETECTED NOT DETECTED Final   Staphylococcus lugdunensis NOT DETECTED NOT DETECTED Final   Streptococcus species NOT DETECTED NOT DETECTED Final   Streptococcus agalactiae NOT DETECTED NOT DETECTED Final   Streptococcus pneumoniae NOT DETECTED NOT DETECTED Final   Streptococcus  pyogenes NOT DETECTED NOT DETECTED Final   A.calcoaceticus-baumannii NOT DETECTED NOT DETECTED Final   Bacteroides fragilis NOT DETECTED NOT DETECTED Final   Enterobacterales NOT DETECTED NOT DETECTED Final   Enterobacter cloacae complex NOT DETECTED NOT DETECTED Final   Escherichia coli NOT DETECTED NOT DETECTED Final   Klebsiella aerogenes NOT DETECTED NOT DETECTED Final   Klebsiella oxytoca NOT DETECTED NOT DETECTED Final   Klebsiella pneumoniae NOT DETECTED NOT DETECTED Final   Proteus species NOT DETECTED NOT DETECTED Final   Salmonella species NOT DETECTED NOT DETECTED Final   Serratia marcescens NOT DETECTED NOT DETECTED Final   Haemophilus influenzae NOT DETECTED NOT DETECTED Final   Neisseria meningitidis NOT DETECTED NOT  DETECTED Final   Pseudomonas aeruginosa NOT DETECTED NOT DETECTED Final   Stenotrophomonas maltophilia NOT DETECTED NOT DETECTED Final   Candida albicans NOT DETECTED NOT DETECTED Final   Candida auris NOT DETECTED NOT DETECTED Final   Candida glabrata NOT DETECTED NOT DETECTED Final   Candida krusei NOT DETECTED NOT DETECTED Final   Candida parapsilosis NOT DETECTED NOT DETECTED Final   Candida tropicalis NOT DETECTED NOT DETECTED Final   Cryptococcus neoformans/gattii NOT DETECTED NOT DETECTED Final   Meth resistant mecA/C and MREJ DETECTED (A) NOT DETECTED Final    Comment: CRITICAL RESULT CALLED TO, READ BACK BY AND VERIFIED WITH: PHARMD G. ABBOTT 11/19/22 @ 338 BY AB Performed at [facility] [address] [place], [place] [address]   Blood culture (routine x 2)     Status: None (Preliminary result)   Collection Time: 11/19/22  3:16 AM   Specimen: BLOOD  Result Value Ref Range Status   Specimen Description BLOOD BLOOD LEFT HAND  Final   Special Requests   Final    BOTTLES DRAWN AEROBIC AND ANAEROBIC Blood Culture adequate volume   Culture   Final    NO GROWTH 3 DAYS Performed at [facility], [place] [address] [place], [place]  [address]    Report Status PENDING  Incomplete  Culture, blood (Routine X 2) w Reflex to ID Panel     Status: Abnormal   Collection Time: 11/20/22  2:20 AM   Specimen: BLOOD LEFT HAND  Result Value Ref Range Status   Specimen Description BLOOD LEFT HAND  Final   Special Requests IN PEDIATRIC BOTTLE Blood Culture adequate volume  Final   Culture  Setup Time   Final    AEROBIC BOTTLE ONLY GRAM POSITIVE COCCI IN CLUSTERS CRITICAL VALUE NOTED.  VALUE IS CONSISTENT WITH PREVIOUSLY REPORTED AND CALLED VALUE.    Culture (A)  Final    STAPHYLOCOCCUS AUREUS SUSCEPTIBILITIES PERFORMED ON PREVIOUS CULTURE WITHIN THE LAST 5 DAYS. Performed at [facility], [place] [address] [place], [place] [address]    Report Status 11/21/2022 FINAL  Final  Culture, blood (Routine X 2) w Reflex to ID Panel     Status: Abnormal   Collection Time: 11/20/22  6:09 AM   Specimen: BLOOD LEFT HAND  Result Value Ref Range Status   Specimen Description BLOOD LEFT HAND  Final   Special Requests   Final    BOTTLES DRAWN AEROBIC ONLY Blood Culture results may not be optimal due to an inadequate volume of blood received in culture bottles   Culture  Setup Time   Final    AEROBIC BOTTLE ONLY GRAM POSITIVE COCCI IN CLUSTERS CRITICAL VALUE NOTED.  VALUE IS CONSISTENT WITH PREVIOUSLY REPORTED AND CALLED VALUE.    Culture (A)  Final    STAPHYLOCOCCUS AUREUS SUSCEPTIBILITIES PERFORMED ON PREVIOUS CULTURE WITHIN THE LAST 5 DAYS. Performed at [facility], [place] [address] [place], [place] [address]    Report Status 11/21/2022 FINAL  Final  Culture, blood (Routine X 2) w Reflex to ID Panel     Status: None (Preliminary result)   Collection Time: 11/21/22 10:04 AM   Specimen: BLOOD LEFT HAND  Result Value Ref Range Status   Specimen Description BLOOD LEFT HAND  Final   Special Requests   Final    BOTTLES DRAWN AEROBIC AND ANAEROBIC Blood Culture adequate volume   Culture  Setup Time   Final    GRAM POSITIVE  COCCI  IN BOTH AEROBIC AND ANAEROBIC BOTTLES CRITICAL VALUE NOTED.  VALUE IS CONSISTENT WITH PREVIOUSLY REPORTED AND CALLED VALUE. Performed at [facility], [address] [place], [place] [address]    Culture GRAM POSITIVE COCCI  Final   Report Status PENDING  Incomplete  Culture, blood (Routine X 2) w Reflex to ID Panel     Status: Abnormal (Preliminary result)   Collection Time: 11/21/22 10:14 AM   Specimen: BLOOD  Result Value Ref Range Status   Specimen Description BLOOD RIGHT ANTECUBITAL  Final   Special Requests   Final    BOTTLES DRAWN AEROBIC ONLY Blood Culture adequate volume   Culture  Setup Time   Final    GRAM POSITIVE COCCI AEROBIC BOTTLE ONLY Gram Stain Report Called to,Read Back By and Verified With: PHARMD G. ABBOTT 11/21/22 @2321  BY AB    Culture (A)  Final    STAPHYLOCOCCUS AUREUS SUSCEPTIBILITIES TO FOLLOW Performed at [facility], [address], [place], [place]    Report Status PENDING  Incomplete  Culture, blood (Routine X 2) w Reflex to ID Panel     Status: None (Preliminary result)   Collection Time: 11/22/22 12:03 PM   Specimen: BLOOD LEFT HAND  Result Value Ref Range Status   Specimen Description BLOOD LEFT HAND  Final   Special Requests   Final    BOTTLES DRAWN AEROBIC AND ANAEROBIC Blood Culture adequate volume   Culture  Setup Time   Final    GRAM POSITIVE COCCI IN CLUSTERS ANAEROBIC BOTTLE ONLY CRITICAL VALUE NOTED.  VALUE IS CONSISTENT WITH PREVIOUSLY REPORTED AND CALLED VALUE. Performed at [facility], [address], [place], [place]    Culture PENDING  Incomplete   Report Status PENDING  Incomplete  Culture, blood (Routine X 2) w Reflex to ID Panel     Status: None (Preliminary result)   Collection Time: 11/22/22 12:24 PM   Specimen: BLOOD RIGHT HAND  Result Value Ref Range Status   Specimen Description BLOOD RIGHT HAND  Final   Special Requests   Final    BOTTLES DRAWN AEROBIC AND ANAEROBIC  Blood Culture adequate volume   Culture  Setup Time   Final    GRAM POSITIVE COCCI IN CLUSTERS ANAEROBIC BOTTLE ONLY Gram Stain Report Called to,Read Back By and Verified With: PHARMD LEDFORD 11/23/22 @ 0512 BY AB Performed at [facility], [address], [place], [place]    Culture PENDING  Incomplete   Report Status PENDING  Incomplete     Medications:    enoxaparin (LOVENOX) injection  40 mg Subcutaneous Q24H   metoprolol tartrate  25 mg Oral BID   morphine  30 mg Oral Q12H   Continuous Infusions:  ceFTAROline (TEFLARO) IV     DAPTOmycin (CUBICIN) 500 mg in sodium chloride 0.9 % IVPB        LOS: 5 days   [id number]  [facility]  11/23/2022, 9:51 AM

## 2022-11-24 ENCOUNTER — Encounter (HOSPITAL_COMMUNITY): Payer: Self-pay | Admitting: Internal Medicine

## 2022-11-24 ENCOUNTER — Inpatient Hospital Stay (HOSPITAL_COMMUNITY): Payer: Medicaid Other

## 2022-11-24 ENCOUNTER — Encounter (HOSPITAL_COMMUNITY)
Admission: EM | Discharge status: Against Medical Advice | Payer: Self-pay | Source: Home / Self Care | Attending: Internal Medicine

## 2022-11-24 ENCOUNTER — Inpatient Hospital Stay (HOSPITAL_COMMUNITY): Payer: Medicaid Other | Admitting: Certified Registered"

## 2022-11-24 DIAGNOSIS — Q2112 Patent foramen ovale: Secondary | ICD-10-CM

## 2022-11-24 DIAGNOSIS — R7881 Bacteremia: Secondary | ICD-10-CM

## 2022-11-24 DIAGNOSIS — M1990 Unspecified osteoarthritis, unspecified site: Secondary | ICD-10-CM

## 2022-11-24 DIAGNOSIS — F17210 Nicotine dependence, cigarettes, uncomplicated: Secondary | ICD-10-CM

## 2022-11-24 DIAGNOSIS — J189 Pneumonia, unspecified organism: Secondary | ICD-10-CM

## 2022-11-24 HISTORY — PX: TEE WITHOUT CARDIOVERSION: SHX5443

## 2022-11-24 LAB — PROTIME-INR
INR: 1.1 (ref 0.8–1.2)
Prothrombin Time: 14.1 seconds (ref 11.4–15.2)

## 2022-11-24 LAB — CULTURE, BLOOD (ROUTINE X 2)
Culture: NO GROWTH
Special Requests: ADEQUATE
Special Requests: ADEQUATE
Special Requests: ADEQUATE

## 2022-11-24 LAB — BASIC METABOLIC PANEL
Anion gap: 9 (ref 5–15)
BUN: 6 mg/dL (ref 6–20)
CO2: 25 mmol/L (ref 22–32)
Calcium: 8 mg/dL — ABNORMAL LOW (ref 8.9–10.3)
Chloride: 100 mmol/L (ref 98–111)
Creatinine, Ser: 0.57 mg/dL (ref 0.44–1.00)
GFR, Estimated: >60 mL/min (ref >60)
Glucose, Bld: 102 mg/dL — ABNORMAL HIGH (ref 70–99)
Potassium: 3.6 mmol/L (ref 3.5–5.1)
Sodium: 134 mmol/L — ABNORMAL LOW (ref 135–145)

## 2022-11-24 SURGERY — ECHOCARDIOGRAM, TRANSESOPHAGEAL
Anesthesia: Monitor Anesthesia Care

## 2022-11-24 MED ORDER — PROPOFOL 500 MG/50ML IV EMUL
INTRAVENOUS | Status: DC | PRN
  Administered 2022-11-24: 100 ug/kg/min via INTRAVENOUS

## 2022-11-24 MED ORDER — BUTAMBEN-TETRACAINE-BENZOCAINE 2-2-14 % EX AERO
INHALATION_SPRAY | CUTANEOUS | Status: DC | PRN
  Administered 2022-11-24: 2 via TOPICAL

## 2022-11-24 MED ORDER — PROPOFOL 10 MG/ML IV BOLUS
INTRAVENOUS | Status: DC | PRN
  Administered 2022-11-24: 50 mg via INTRAVENOUS

## 2022-11-24 MED ORDER — SODIUM CHLORIDE 0.9 % IV SOLN: INTRAVENOUS | Status: DC

## 2022-11-24 MED ORDER — LIDOCAINE 2% (20 MG/ML) 5 ML SYRINGE
INTRAMUSCULAR | Status: DC | PRN
  Administered 2022-11-24: 60 mg via INTRAVENOUS

## 2022-11-24 NOTE — Progress Notes (Signed)
Dr. Myna Hidalgo was made aware that pt refused teflaro 600mg  IVPB, abx.

## 2022-11-24 NOTE — Anesthesia Preprocedure Evaluation (Signed)
Anesthesia Evaluation  Patient identified by MRN, date of birth, ID band Patient awake    Reviewed: Allergy & Precautions, NPO status , Patient's Chart, lab work & pertinent test results  History of Anesthesia Complications Negative for: history of anesthetic complications  Airway Mallampati: II  TM Distance: >3 FB Neck ROM: Full    Dental  (+) Dental Advisory Given   Pulmonary pneumonia, Recent URI , Current Smoker and Patient abstained from smoking.   breath sounds clear to auscultation       Cardiovascular + Orthopnea  negative cardio ROS  Rhythm:Regular Rate:Normal     Neuro/Psych    Depression    negative neurological ROS     GI/Hepatic negative GI ROS,,,(+)     substance abuse  cocaine use and IV drug use, Hepatitis -  Endo/Other  negative endocrine ROS    Renal/GU negative Renal ROS     Musculoskeletal  (+) Arthritis ,  narcotic dependent  Abdominal   Peds  Hematology   Anesthesia Other Findings   Reproductive/Obstetrics                              Anesthesia Physical Anesthesia Plan  ASA: 3  Anesthesia Plan: MAC   Post-op Pain Management:    Induction:   PONV Risk Score and Plan: 1 and Ondansetron  Airway Management Planned: Natural Airway and Nasal Cannula  Additional Equipment: None  Intra-op Plan:   Post-operative Plan:   Informed Consent: I have reviewed the patients History and Physical, chart, labs and discussed the procedure including the risks, benefits and alternatives for the proposed anesthesia with the patient or authorized representative who has indicated his/her understanding and acceptance.     Dental advisory given  Plan Discussed with: CRNA and Surgeon  Anesthesia Plan Comments:          Anesthesia Quick Evaluation

## 2022-11-24 NOTE — Progress Notes (Signed)
PROGRESS NOTE        PATIENT DETAILS Name: Cassandra BussingChrista Roberts Age: 32 y.o. Sex: female Date of Birth: Oct 27, 1991 Admit Date: 11/18/2022 Admitting Physician Marinda ElkAbraham Feliz Ortiz, MD ZOX:[id number]:Patient, No Pcp Per  Brief Summary: Patient is a 32 y.o.  female with history of IVDA-recent hospitalization [facility] for multifocal pneumonia-left AMA-presented with chest tightness/shortness of breath-she was found to have sepsis due to multifocal pneumonia-and subsequently blood cultures positive for MRSA.  Significant events: 1/11>> admit to [facility]-sepsis-multifocal PNA-blood cultures subsequently positive for MRSA.  Significant studies: 1/11>> CT chest: Multifocal lung consolidation-greatest in the left side. 1/12>> TTE: EF 70-75%-no obvious vegetation 1/15>> MRI left shoulder: Moderate ill-defined fluid between the deltoid, infraspinatus and teres minor muscles.  No well-defined fluid collection identified. 1/15>> MRI left femur: Motion degraded exam-nondiagnostic exam. 1/16>> MRI left hip: Mild to moderate fluid tracking along the anterior aspect of the left iliopsoas junction-nonspecific-may represent bursitis.  Significant microbiology data: 1/11>> COVID/influenza/RSV PCR: Negative 1/11>> blood culture: MRSA 1/13>> blood culture: MRSA 1/14>> blood culture: MRSA 1/15>> blood culture: Negative 1/16>> blood culture: Negative  Procedures: None  Consults: ID Orthopedics  Subjective: Lying comfortably in bed-denies any chest pain or shortness of breath.  Objective: Vitals: Blood pressure 115/76, pulse 86, temperature 99.5 F (37.5 C), temperature source Oral, resp. rate 18, height 5\' 2"  (1.575 m), weight 56.7 kg, last menstrual period 11/04/2022, SpO2 100 %.   Exam: Gen Exam:Alert awake-not in any distress HEENT:atraumatic, normocephalic Chest: B/L clear to auscultation anteriorly CVS:S1S2 regular Abdomen:soft non tender, non  distended Extremities:no edema Neurology: Non focal Skin: no rash  Pertinent Labs/Radiology:    Latest Ref Rng & Units 11/19/2022    3:16 AM 11/18/2022    8:28 AM 03/24/2020    4:37 AM  CBC  WBC 4.0 - 10.5 K/uL 15.6  15.6  20.3   Hemoglobin 12.0 - 15.0 g/dL 09.811.2  11.912.6  14.712.4   Hematocrit 36.0 - 46.0 % 31.8  36.4  35.8   Platelets 150 - 400 K/uL 194  216  425     Lab Results  Component Value Date   NA 135 11/23/2022   K 4.3 11/23/2022   CL 102 11/23/2022   CO2 24 11/23/2022      Assessment/Plan: Sepsis secondary to MRSA bacteremia/septic emboli Sepsis physiology resolved Blood cultures on 1/15, 1/16 negative For TEE today No drainable fluid collection seen on MRI studies of left hip/left shoulder Continue as needed narcotics for pain issues. Clearly not a candidate for outpatient IV antibiotics-will need to stay inpatient to complete IV antibiotic therapy.  History of polysubstance abuse/IVDU (UDS positive for cocaine/amphetamines) Counseled  Chronic HCV infection Outpatient management  BMI: Estimated body mass index is 22.86 kg/m as calculated from the following:   Height as of this encounter: 5\' 2"  (1.575 m).   Weight as of this encounter: 56.7 kg.   Code status:   Code Status: Full Code   DVT Prophylaxis: enoxaparin (LOVENOX) injection 40 mg Start: 11/18/22 1930   Family Communication: None at bedside   Disposition Plan: Status is: Inpatient Remains inpatient appropriate because: Severity of illness.   Planned Discharge Destination:Home   Diet: Diet Order             Diet NPO time specified  Diet effective midnight  Antimicrobial agents: Anti-infectives (From admission, onward)    Start     Dose/Rate Route Frequency Ordered Stop   11/23/22 1400  DAPTOmycin (CUBICIN) 500 mg in sodium chloride 0.9 % IVPB        500 mg 120 mL/hr over 30 Minutes Intravenous Daily 11/23/22 0921     11/23/22 1000  ceftaroline (TEFLARO) 600  mg in sodium chloride 0.9 % 100 mL IVPB        600 mg 100 mL/hr over 60 Minutes Intravenous Every 8 hours 11/23/22 0921     11/22/22 1445  vancomycin (VANCOCIN) IVPB 1000 mg/200 mL premix  Status:  Discontinued        1,000 mg 200 mL/hr over 60 Minutes Intravenous Every 8 hours 11/22/22 1347 11/23/22 0921   11/19/22 0200  vancomycin (VANCOCIN) IVPB 750 mg/150 ml premix  Status:  Discontinued        750 mg 150 mL/hr over 60 Minutes Intravenous Every 12 hours 11/18/22 1354 11/19/22 0110   11/19/22 0200  vancomycin (VANCOREADY) IVPB 750 mg/150 mL  Status:  Discontinued        750 mg 150 mL/hr over 60 Minutes Intravenous Every 12 hours 11/19/22 0110 11/22/22 1347   11/19/22 0000  linezolid (ZYVOX) 600 MG tablet        600 mg Oral 2 times daily 11/19/22 1045 12/31/22 2359   11/18/22 1330  ceFEPIme (MAXIPIME) 2 g in sodium chloride 0.9 % 100 mL IVPB        2 g 200 mL/hr over 30 Minutes Intravenous  Once 11/18/22 1324 11/18/22 1408   11/18/22 1330  Vancomycin (VANCOCIN) 1,250 mg in sodium chloride 0.9 % 250 mL IVPB        1,250 mg 166.7 mL/hr over 90 Minutes Intravenous  Once 11/18/22 1329 11/18/22 1652        MEDICATIONS: Scheduled Meds:  enoxaparin (LOVENOX) injection  40 mg Subcutaneous Q24H   metoprolol tartrate  25 mg Oral BID   morphine  30 mg Oral Q12H   nicotine  14 mg Transdermal Daily   Continuous Infusions:  sodium chloride     ceFTAROline (TEFLARO) IV 600 mg (11/23/22 1812)   DAPTOmycin (CUBICIN) 500 mg in sodium chloride 0.9 % IVPB Stopped (11/23/22 1447)   PRN Meds:.acetaminophen **OR** acetaminophen, metoprolol tartrate, ondansetron **OR** ondansetron (ZOFRAN) IV, polyethylene glycol   I have personally reviewed following labs and imaging studies  LABORATORY DATA: CBC: Recent Labs  Lab 11/18/22 0828 11/19/22 0316  WBC 15.6* 15.6*  HGB 12.6 11.2*  HCT 36.4 31.8*  MCV 87.3 85.9  PLT 216 194    Basic Metabolic Panel: Recent Labs  Lab 11/18/22 0828  11/19/22 0316 11/20/22 0220 11/23/22 0809  NA 130* 134* 134* 135  K 3.7 3.1* 4.3 4.3  CL 97* 101 102 102  CO2 23 24 23 24   GLUCOSE 125* 149* 99 101*  BUN 5* 7 9 6   CREATININE 0.59 0.67 0.60 0.50  CALCIUM 8.6* 8.0* 8.2* 7.7*    GFR: Estimated Creatinine Clearance: 80.6 mL/min (by C-G formula based on SCr of 0.5 mg/dL).  Liver Function Tests: Recent Labs  Lab 11/19/22 0316  AST 19  ALT 42  ALKPHOS 162*  BILITOT 0.9  PROT 5.6*  ALBUMIN 2.3*   No results for input(s): "LIPASE", "AMYLASE" in the last 168 hours. No results for input(s): "AMMONIA" in the last 168 hours.  Coagulation Profile: No results for input(s): "INR", "PROTIME" in the last 168 hours.  Cardiac Enzymes: Recent Labs  Lab 11/23/22 1303  CKTOTAL 16*    BNP (last 3 results) No results for input(s): "PROBNP" in the last 8760 hours.  Lipid Profile: No results for input(s): "CHOL", "HDL", "LDLCALC", "TRIG", "CHOLHDL", "LDLDIRECT" in the last 72 hours.  Thyroid Function Tests: No results for input(s): "TSH", "T4TOTAL", "FREET4", "T3FREE", "THYROIDAB" in the last 72 hours.  Anemia Panel: No results for input(s): "VITAMINB12", "FOLATE", "FERRITIN", "TIBC", "IRON", "RETICCTPCT" in the last 72 hours.  Urine analysis:    Component Value Date/Time   COLORURINE YELLOW 11/18/2022 1020   APPEARANCEUR CLOUDY (A) 11/18/2022 1020   LABSPEC 1.009 11/18/2022 1020   PHURINE 6.0 11/18/2022 1020   GLUCOSEU NEGATIVE 11/18/2022 1020   HGBUR SMALL (A) 11/18/2022 1020   BILIRUBINUR NEGATIVE 11/18/2022 1020   KETONESUR NEGATIVE 11/18/2022 1020   PROTEINUR NEGATIVE 11/18/2022 1020   UROBILINOGEN 0.2 07/02/2015 1044   NITRITE POSITIVE (A) 11/18/2022 1020   LEUKOCYTESUR MODERATE (A) 11/18/2022 1020    Sepsis Labs: Lactic Acid, Venous    Component Value Date/Time   LATICACIDVEN 1.4 11/18/2022 1303    MICROBIOLOGY: Recent Results (from the past 240 hour(s))  Resp panel by RT-PCR (RSV, Flu A&B, Covid) Anterior  Nasal Swab     Status: None   Collection Time: 11/18/22 10:08 AM   Specimen: Anterior Nasal Swab  Result Value Ref Range Status   SARS Coronavirus 2 by RT PCR NEGATIVE NEGATIVE Final    Comment: (NOTE) SARS-CoV-2 target nucleic acids are NOT DETECTED.  The SARS-CoV-2 RNA is generally detectable in upper respiratory specimens during the acute phase of infection. The lowest concentration of SARS-CoV-2 viral copies this assay can detect is 138 copies/mL. A negative result does not preclude SARS-Cov-2 infection and should not be used as the sole basis for treatment or other patient management decisions. A negative result may occur with  improper specimen collection/handling, submission of specimen other than nasopharyngeal swab, presence of viral mutation(s) within the areas targeted by this assay, and inadequate number of viral copies(<138 copies/mL). A negative result must be combined with clinical observations, patient history, and epidemiological information. The expected result is Negative.  Fact Sheet for Patients:  BloggerCourse.com  Fact Sheet for Healthcare Providers:  SeriousBroker.it  This test is no t yet approved or cleared by the [place] FDA and  has been authorized for detection and/or diagnosis of SARS-CoV-2 by FDA under an Emergency Use Authorization (EUA). This EUA will remain  in effect (meaning this test can be used) for the duration of the COVID-19 declaration under Section 564(b)(1) of the Act, 21 U.S.C.section 360bbb-3(b)(1), unless the authorization is terminated  or revoked sooner.       Influenza A by PCR NEGATIVE NEGATIVE Final   Influenza B by PCR NEGATIVE NEGATIVE Final    Comment: (NOTE) The Xpert Xpress SARS-CoV-2/FLU/RSV plus assay is intended as an aid in the diagnosis of influenza from Nasopharyngeal swab specimens and should not be used as a sole basis for treatment. Nasal washings  and aspirates are unacceptable for Xpert Xpress SARS-CoV-2/FLU/RSV testing.  Fact Sheet for Patients: BloggerCourse.com  Fact Sheet for Healthcare Providers: SeriousBroker.it  This test is not yet approved or cleared by the [place] FDA and has been authorized for detection and/or diagnosis of SARS-CoV-2 by FDA under an Emergency Use Authorization (EUA). This EUA will remain in effect (meaning this test can be used) for the duration of the COVID-19 declaration under Section 564(b)(1) of the Act, 21 U.S.C. section 360bbb-3(b)(1), unless the authorization is terminated or  revoked.     Resp Syncytial Virus by PCR NEGATIVE NEGATIVE Final    Comment: (NOTE) Fact Sheet for Patients: EntrepreneurPulse.com.au  Fact Sheet for Healthcare Providers: IncredibleEmployment.be  This test is not yet approved or cleared by the [place] FDA and has been authorized for detection and/or diagnosis of SARS-CoV-2 by FDA under an Emergency Use Authorization (EUA). This EUA will remain in effect (meaning this test can be used) for the duration of the COVID-19 declaration under Section 564(b)(1) of the Act, 21 U.S.C. section 360bbb-3(b)(1), unless the authorization is terminated or revoked.  Performed at [facility], [place] [address] [place], [place] [address]   Blood culture (routine x 2)     Status: Abnormal   Collection Time: 11/18/22 12:07 PM   Specimen: BLOOD  Result Value Ref Range Status   Specimen Description BLOOD SITE NOT SPECIFIED  Final   Special Requests   Final    BOTTLES DRAWN AEROBIC AND ANAEROBIC Blood Culture adequate volume   Culture  Setup Time   Final    GRAM POSITIVE COCCI IN BOTH AEROBIC AND ANAEROBIC BOTTLES CRITICAL RESULT CALLED TO, READ BACK BY AND VERIFIED WITH: PHARMD G. ABBOTT 11/19/22 @ 338 BY AB Performed at [facility], [place] [address], [place], [place]  [address]    Culture METHICILLIN RESISTANT STAPHYLOCOCCUS AUREUS (A)  Final   Report Status 11/21/2022 FINAL  Final   Organism ID, Bacteria METHICILLIN RESISTANT STAPHYLOCOCCUS AUREUS  Final      Susceptibility   Methicillin resistant staphylococcus aureus - MIC*    CIPROFLOXACIN >=8 RESISTANT Resistant     ERYTHROMYCIN >=8 RESISTANT Resistant     GENTAMICIN <=0.5 SENSITIVE Sensitive     OXACILLIN >=4 RESISTANT Resistant     TETRACYCLINE <=1 SENSITIVE Sensitive     VANCOMYCIN <=0.5 SENSITIVE Sensitive     TRIMETH/SULFA <=10 SENSITIVE Sensitive     CLINDAMYCIN >=8 RESISTANT Resistant     RIFAMPIN <=0.5 SENSITIVE Sensitive     Inducible Clindamycin NEGATIVE Sensitive     * METHICILLIN RESISTANT STAPHYLOCOCCUS AUREUS  Blood Culture ID Panel (Reflexed)     Status: Abnormal   Collection Time: 11/18/22 12:07 PM  Result Value Ref Range Status   Enterococcus faecalis NOT DETECTED NOT DETECTED Final   Enterococcus Faecium NOT DETECTED NOT DETECTED Final   Listeria monocytogenes NOT DETECTED NOT DETECTED Final   Staphylococcus species DETECTED (A) NOT DETECTED Final    Comment: CRITICAL RESULT CALLED TO, READ BACK BY AND VERIFIED WITH: PHARMD G. ABBOTT 11/19/22 @ 338 BY AB    Staphylococcus aureus (BCID) DETECTED (A) NOT DETECTED Final    Comment: Methicillin (oxacillin)-resistant Staphylococcus aureus (MRSA). MRSA is predictably resistant to beta-lactam antibiotics (except ceftaroline). Preferred therapy is vancomycin unless clinically contraindicated. Patient requires contact precautions if  hospitalized. CRITICAL RESULT CALLED TO, READ BACK BY AND VERIFIED WITH: PHARMD G. ABBOTT 11/19/22 @ 338 BY AB    Staphylococcus epidermidis NOT DETECTED NOT DETECTED Final   Staphylococcus lugdunensis NOT DETECTED NOT DETECTED Final   Streptococcus species NOT DETECTED NOT DETECTED Final   Streptococcus agalactiae NOT DETECTED NOT DETECTED Final   Streptococcus pneumoniae NOT DETECTED NOT DETECTED Final    Streptococcus pyogenes NOT DETECTED NOT DETECTED Final   A.calcoaceticus-baumannii NOT DETECTED NOT DETECTED Final   Bacteroides fragilis NOT DETECTED NOT DETECTED Final   Enterobacterales NOT DETECTED NOT DETECTED Final   Enterobacter cloacae complex NOT DETECTED NOT DETECTED Final   Escherichia coli NOT DETECTED NOT DETECTED Final  Klebsiella aerogenes NOT DETECTED NOT DETECTED Final   Klebsiella oxytoca NOT DETECTED NOT DETECTED Final   Klebsiella pneumoniae NOT DETECTED NOT DETECTED Final   Proteus species NOT DETECTED NOT DETECTED Final   Salmonella species NOT DETECTED NOT DETECTED Final   Serratia marcescens NOT DETECTED NOT DETECTED Final   Haemophilus influenzae NOT DETECTED NOT DETECTED Final   Neisseria meningitidis NOT DETECTED NOT DETECTED Final   Pseudomonas aeruginosa NOT DETECTED NOT DETECTED Final   Stenotrophomonas maltophilia NOT DETECTED NOT DETECTED Final   Candida albicans NOT DETECTED NOT DETECTED Final   Candida auris NOT DETECTED NOT DETECTED Final   Candida glabrata NOT DETECTED NOT DETECTED Final   Candida krusei NOT DETECTED NOT DETECTED Final   Candida parapsilosis NOT DETECTED NOT DETECTED Final   Candida tropicalis NOT DETECTED NOT DETECTED Final   Cryptococcus neoformans/gattii NOT DETECTED NOT DETECTED Final   Meth resistant mecA/C and MREJ DETECTED (A) NOT DETECTED Final    Comment: CRITICAL RESULT CALLED TO, READ BACK BY AND VERIFIED WITH: PHARMD G. ABBOTT 11/19/22 @ 338 BY AB Performed at [facility], [address] [place], [place] [address]   Blood culture (routine x 2)     Status: None (Preliminary result)   Collection Time: 11/19/22  3:16 AM   Specimen: BLOOD  Result Value Ref Range Status   Specimen Description BLOOD BLOOD LEFT HAND  Final   Special Requests   Final    BOTTLES DRAWN AEROBIC AND ANAEROBIC Blood Culture adequate volume   Culture   Final    NO GROWTH 4 DAYS Performed at [facility], [address]  [place], [place] [address]    Report Status PENDING  Incomplete  Culture, blood (Routine X 2) w Reflex to ID Panel     Status: Abnormal   Collection Time: 11/20/22  2:20 AM   Specimen: BLOOD LEFT HAND  Result Value Ref Range Status   Specimen Description BLOOD LEFT HAND  Final   Special Requests IN PEDIATRIC BOTTLE Blood Culture adequate volume  Final   Culture  Setup Time   Final    AEROBIC BOTTLE ONLY GRAM POSITIVE COCCI IN CLUSTERS CRITICAL VALUE NOTED.  VALUE IS CONSISTENT WITH PREVIOUSLY REPORTED AND CALLED VALUE.    Culture (A)  Final    STAPHYLOCOCCUS AUREUS SUSCEPTIBILITIES PERFORMED ON PREVIOUS CULTURE WITHIN THE LAST 5 DAYS. Performed at [facility], [address] [place], [place] [address]    Report Status 11/21/2022 FINAL  Final  Culture, blood (Routine X 2) w Reflex to ID Panel     Status: Abnormal   Collection Time: 11/20/22  6:09 AM   Specimen: BLOOD LEFT HAND  Result Value Ref Range Status   Specimen Description BLOOD LEFT HAND  Final   Special Requests   Final    BOTTLES DRAWN AEROBIC ONLY Blood Culture results may not be optimal due to an inadequate volume of blood received in culture bottles   Culture  Setup Time   Final    AEROBIC BOTTLE ONLY GRAM POSITIVE COCCI IN CLUSTERS CRITICAL VALUE NOTED.  VALUE IS CONSISTENT WITH PREVIOUSLY REPORTED AND CALLED VALUE.    Culture (A)  Final    STAPHYLOCOCCUS AUREUS SUSCEPTIBILITIES PERFORMED ON PREVIOUS CULTURE WITHIN THE LAST 5 DAYS. Performed at [facility], [address] [place], [place] [address]    Report Status 11/21/2022 FINAL  Final  Culture, blood (Routine X 2) w Reflex to ID Panel  Status: Abnormal   Collection Time: 11/21/22 10:04 AM   Specimen: BLOOD LEFT HAND  Result Value Ref Range Status   Specimen Description BLOOD LEFT HAND  Final   Special Requests   Final    BOTTLES DRAWN AEROBIC AND ANAEROBIC Blood Culture adequate volume   Culture  Setup Time   Final    GRAM POSITIVE COCCI IN  BOTH AEROBIC AND ANAEROBIC BOTTLES CRITICAL VALUE NOTED.  VALUE IS CONSISTENT WITH PREVIOUSLY REPORTED AND CALLED VALUE.    Culture (A)  Final    STAPHYLOCOCCUS AUREUS SUSCEPTIBILITIES PERFORMED ON PREVIOUS CULTURE WITHIN THE LAST 5 DAYS. Performed at [facility], [address] [place], [place] [address]    Report Status 11/24/2022 FINAL  Final  Culture, blood (Routine X 2) w Reflex to ID Panel     Status: Abnormal   Collection Time: 11/21/22 10:14 AM   Specimen: BLOOD  Result Value Ref Range Status   Specimen Description BLOOD RIGHT ANTECUBITAL  Final   Special Requests   Final    BOTTLES DRAWN AEROBIC ONLY Blood Culture adequate volume   Culture  Setup Time   Final    GRAM POSITIVE COCCI AEROBIC BOTTLE ONLY Gram Stain Report Called to,Read Back By and Verified With: PHARMD G. ABBOTT 11/21/22 @2321 BY AB    Culture (A)  Final    STAPHYLOCOCCUS AUREUS SUSCEPTIBILITIES PERFORMED ON PREVIOUS CULTURE WITHIN THE LAST 5 DAYS. Performed at [facility], [address] [place], [place] [address]    Report Status 11/24/2022 FINAL  Final  Culture, blood (Routine X 2) w Reflex to ID Panel     Status: None (Preliminary result)   Collection Time: 11/22/22 12:03 PM   Specimen: BLOOD LEFT HAND  Result Value Ref Range Status   Specimen Description BLOOD LEFT HAND  Final   Special Requests   Final    BOTTLES DRAWN AEROBIC AND ANAEROBIC Blood Culture adequate volume   Culture  Setup Time   Final    GRAM POSITIVE COCCI IN CLUSTERS ANAEROBIC BOTTLE ONLY CRITICAL VALUE NOTED.  VALUE IS CONSISTENT WITH PREVIOUSLY REPORTED AND CALLED VALUE. Performed at [facility], [address] [place], [place] [address]    Culture GRAM POSITIVE COCCI  Final   Report Status PENDING  Incomplete  Culture, blood (Routine X 2) w Reflex to ID Panel     Status: None (Preliminary result)   Collection Time: 11/22/22 12:24 PM   Specimen: BLOOD RIGHT HAND  Result Value Ref Range Status    Specimen Description BLOOD RIGHT HAND  Final   Special Requests   Final    BOTTLES DRAWN AEROBIC AND ANAEROBIC Blood Culture adequate volume   Culture  Setup Time   Final    GRAM POSITIVE COCCI IN CLUSTERS IN BOTH AEROBIC AND ANAEROBIC BOTTLES Gram Stain Report Called to,Read Back By and Verified With: PHARMD LEDFORD 11/23/22 @ 0512 BY AB Performed at  Hospital Lab, [address] [place], [place] [address]    Culture GRAM POSITIVE COCCI  Final   Report Status PENDING  Incomplete  Culture, blood (Routine X 2) w Reflex to ID Panel     Status: None (Preliminary result)   Collection Time: 11/23/22  1:03 PM   Specimen: BLOOD LEFT ARM  Result Value Ref Range Status   Specimen Description BLOOD LEFT ARM  Final   Special Requests   Final    BOTTLES DRAWN AEROBIC ONLY Blood Culture adequate volume     Culture   Final    NO GROWTH <12 HOURS Performed at [facility], [place] [address], [place], [place] [address]    Report Status PENDING  Incomplete  Culture, blood (Routine X 2) w Reflex to ID Panel     Status: None (Preliminary result)   Collection Time: 11/23/22  1:04 PM   Specimen: BLOOD LEFT ARM  Result Value Ref Range Status   Specimen Description BLOOD LEFT ARM  Final   Special Requests   Final    BOTTLES DRAWN AEROBIC ONLY Blood Culture adequate volume   Culture   Final    NO GROWTH <12 HOURS Performed at [facility], [place] [address] [place], [place] [address]    Report Status PENDING  Incomplete    RADIOLOGY STUDIES/RESULTS: MR HIP LEFT W WO CONTRAST  Result Date: 11/23/2022 CLINICAL DATA:  Septic arthritis suspected. EXAM: MRI OF THE LEFT HIP WITHOUT AND WITH CONTRAST TECHNIQUE: Multiplanar, multisequence MR imaging was performed both before and after administration of intravenous contrast. CONTRAST:  70mL GADAVIST GADOBUTROL 1 MMOL/ML IV SOLN COMPARISON:  Limited MRI bilateral femurs (patient was unable to complete exam) 11/22/2022 FINDINGS: Bones: Mild pubic  symphysis joint space narrowing and peripheral osteophytosis. No acute fracture or avascular necrosis is seen within the visualized portions of the pelvis or either proximal femur. There is decreased T1 signal seen diffusely throughout the ilium, ischium, and pubis of the pelvis. This remains hyperintense to skeletal muscle. This is nonspecific but compatible with red marrow reconversion as can be seen in the settings of obesity, anemia, and/or smoking along with other etiologies. No cortical erosion is seen. Articular cartilage and labrum Articular cartilage: Mild thinning of the superior left acetabular and femoral head cartilage. Labrum: Lack of intra-articular fluid limits evaluation of the left acetabular labrum. No dedicated left hip axial fluid sensitive images were performed. No definitive labral tear is seen on the provided images. Joint or bursal effusion Joint effusion:  No joint effusion within either hip. Bursae: No trochanteric bursitis on either side. Muscles and tendons Muscles and tendons: The origins of the bilateral rectus femoris and common hamstring tendons are intact. The insertions of the bilateral gluteus minimus, gluteus medius, and iliopsoas tendons are intact. There is mild-to-moderate fluid tracking along the anterior aspect of the left iliopsoas junction, possibly within the left iliopsoas bursa (axial series 5, image 21, left hip coronal series 7, image 19. No definite enhancement in this region. This is nonspecific and may represent iliopsoas bursitis, however this edema does extend more superiorly, superficial to the anterior aspect of the left iliacus muscle (axial series 5 images 1 through 12, and in this region there is thin peripheral enhancement and possible multiple tiny pockets of rim enhancing fluid that could represent tiny abscesses (axial series 10 images 4 through 16). Recommend clinical correlation. There is also decreased T1 increased T2 signal likely fluid with multiple  thin internal septations and some internal loculated components, especially anteriorly (coronal series 3, images 7 through 14 and axial series 5 images 12 through 16) centered deep to the right pelvic sidewall musculature. No postcontrast images were performed including this region. Given the indication, it is difficult to exclude an abscess in this region. The dominant portion of this measures up to approximately 1.6 x 3.3 x 4.4 cm (transverse by AP by craniocaudal). This process also extends mildly more inferiorly and anteriorly, just deep to the inferior aspect of the right superior pubic ramus (axial series 5 images  16 through 19, coronal series 3 images 13 through 16). Other findings Miscellaneous: Mild free fluid within the pelvis, within normal limits for a premenopausal female. Normal physiologic follicles are seen within the bilateral ovaries. IMPRESSION: 1. There is mild-to-moderate fluid tracking along the anterior aspect of the left iliopsoas junction, possibly within the left iliopsoas bursa. No definite peripheral enhancement in this region. This is nonspecific and may represent iliopsoas bursitis, however this mild fluid/edema does extend more superiorly, superficial to the anterior aspect of the left iliacus muscle. There is thin peripheral enhancement and possible multiple tiny pockets of rim enhancing fluid that could represent tiny abscesses anterior to the left iliacus muscle. 2. Additional fluid signal with multiple thin internal septations and loculations centered in between the right acetabulum and the right pelvic sidewall musculature. Noting the indication for this study, MRI cannot determine whether this fluid is infected. 3. No bone cortical erosion or marrow edema to indicate evidence of acute osteomyelitis. Electronically Signed   By: Neita Garnetonald  Viola M.D.   On: 11/23/2022 11:58   MR FEMUR LEFT WO CONTRAST  Result Date: 11/22/2022 CLINICAL DATA:  Limping, acute thigh pain EXAM: MR OF  THE LEFT FEMUR WITHOUT CONTRAST TECHNIQUE: Multiplanar, multisequence MR imaging of the left was performed. No intravenous contrast was administered. COMPARISON:  None Available. FINDINGS: The exam is significantly limited, only coronal T2 and T1 sequences were obtained. Patient motion limits evaluation IMPRESSION: Significantly limited exam due to patient motion. The exam is only coronal T2 and T1 sequences were obtained. Nondiagnostic examination. Electronically Signed   By: Larose HiresImran  Ahmed D.O.   On: 11/22/2022 15:33   MR SHOULDER LEFT WO CONTRAST  Result Date: 11/22/2022 CLINICAL DATA:  Shoulder pain. Septic arthritis suspected. History of polysubstance abuse and intravenous drug use. Probable pulmonary septic emboli on chest CT. EXAM: MRI OF THE LEFT SHOULDER WITHOUT CONTRAST TECHNIQUE: Multiplanar, multisequence MR imaging of the shoulder was performed. No intravenous contrast was administered. COMPARISON:  Chest CT 11/18/2022.  CT left elbow 03/23/2020. FINDINGS: Rotator cuff:  Intact without significant tendinosis. Muscles: No focal muscular atrophy. There is a moderate amount of ill-defined fluid posteriorly between the deltoid, infraspinatus and teres minor muscles. Edema extends into the deltoid muscle. No well-defined focal fluid collections are identified. Biceps long head:  Intact and normally positioned. Acromioclavicular Joint: The acromion is type 2. No significant abnormality of the acromioclavicular joint. No generalized fluid in the subacromial-subdeltoid bursa. Glenohumeral Joint: No significant shoulder joint effusion or glenohumeral arthropathy. Labrum: Labral assessment limited by the lack of joint fluid. No evidence of labral tear or paralabral cyst. Bones: No evidence of acute fracture, dislocation or osteomyelitis. No erosive changes are identified. Other: As above, posterior intramuscular and intermuscular ill-defined fluid which could be infected. No focal fluid collection identified.  IMPRESSION: 1. Moderate ill-defined fluid posteriorly between the deltoid, infraspinatus and teres minor muscles which could be infected. No well-defined focal fluid collection identified. 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 2. No evidence of septic arthritis or osteomyelitis. 3. The rotator cuff, biceps tendon and labrum appear intact. Electronically Signed   By: Carey BullocksWilliam  Veazey M.D.   On: 11/22/2022 14:25     LOS: 6 days   Jeoffrey MassedShanker Youssef Footman, MD  [facility]    To contact the attending provider between 7A-7P or the covering provider during after hours 7P-7A, please log into the web site [email or web address] and access using universal [place] password for that web site. If you do not have the password, please call the  hospital operator.  11/24/2022, 9:26 AM

## 2022-11-24 NOTE — Progress Notes (Addendum)
[facility] Infectious Diseases Follow Up Note  Patient Identification: Patient Name: Cassandra Roberts MRN: [id number] [place] Date: 11/18/2022  8:00 AM Age: 32 y.o.Today's Date: 11/24/2022  Reason for Visit: MRSA bacteremia   Principal Problem:   MRSA bacteremia Active Problems:   Polysubstance abuse ([place])   IVDU (intravenous drug user)   CAP (community acquired pneumonia)   Septic embolism ([place])   Sepsis ([place])   Sinus tachycardia   Cellulitis of left foot   Multifocal pneumonia   Staphylococcal arthritis of left shoulder (HCC)   Chronic hepatitis C with hepatic coma (HCC)   Pyomyositis   Antibiotics: Vancomycin 1/11- 1/15, Daptomycin/ceftaroline como 1/16-c Total days of abtx d 7   Lines/Hardwares: No known   Interval Events: T max 100.2 in the last 24 hrs. 1/16 blood cx NG in less than 12 hrs, TEE pending  Assessment 32 year old female with history of IVDU and polysubstance abuse was initially seen at [facility] on 12/23 for multifocal pneumonia and presented to [facility] ED for worsening cough and left lower extremity cellulitis.  Admitted with  # MRSA bacteremia with possible pulmonary septic emboli with at least with cavity - no spinal tenderness at CTL spine  - 1/11 Blood cx  MRSA - 1/12 blood cx NG -1/13 blood cx 2/2 sets  MRSA - 1/14 blood cx 2/2 sets MRSA - 1/15 blood cx 2/2 sets MRSA - 1/6 blood cx 2/2 sets NG in less than 12 hrs - 1/17 TEE negative for vegetations or endocarditis. Bubble Contrast Study: Positive, mild bubble crossover, small PFO    # Moderate ill-defined fluid posteriorly between the deltoid, infraspinatus and teres minor muscles/Possible posterior deltoid myositis.  # Fluid tracking along the left iliopsoas bursa, possible iliopsoas bursitis with associated multiple tiny abscesses  as well as multiple thin internal septations and loculations in between the rt acetabulum and rt pelvic wall.   Evaluated by orthopedics, no intervention recommended. Recommended to continue IV abtx.   # Hepatitis C - management OP  Recommendations Continue Daptomycin and ceftaroline as is for now. Monitor CPK Fu repeat blood cx 1/16 for clearance Monitor for metastatic sites of infection  Will plan to re-image Left hip and possibly left shoulder at a later date  Following   Rest of the management as per the primary team. Thank you for the consult. Please page with pertinent questions or concerns.  ______________________________________________________________________ Subjective Patient in OR for TEE and could not be seen   Vitals BP 134/80   Pulse 89   Temp 98 F (36.7 C) (Temporal)   Resp (!) 26   Ht 5\' 2"  (1.575 m)   Wt 56.7 kg   LMP 11/04/2022   SpO2 97%   BMI 22.86 kg/m   Pertinent Microbiology Results for orders placed or performed during the hospital encounter of 11/18/22  Resp panel by RT-PCR (RSV, Flu A&B, Covid) Anterior Nasal Swab     Status: None   Collection Time: 11/18/22 10:08 AM   Specimen: Anterior Nasal Swab  Result Value Ref Range Status   SARS Coronavirus 2 by RT PCR NEGATIVE NEGATIVE Final    Comment: (NOTE) SARS-CoV-2 target nucleic acids are NOT DETECTED.  The SARS-CoV-2 RNA is generally detectable in upper respiratory specimens during the acute phase of infection. The lowest concentration of SARS-CoV-2 viral copies this assay can detect is 138 copies/mL. A negative result does not preclude SARS-Cov-2 infection and should not be used as the sole basis for treatment or other patient management decisions. A  negative result may occur with  improper specimen collection/handling, submission of specimen other than nasopharyngeal swab, presence of viral mutation(s) within the areas targeted by this assay, and inadequate number of viral copies(<138 copies/mL). A negative result must be combined with clinical observations, patient history, and  epidemiological information. The expected result is Negative.  Fact Sheet for Patients:  BloggerCourse.comhttps://www.fda.gov/media/152166/download  Fact Sheet for Healthcare Providers:  SeriousBroker.ithttps://www.fda.gov/media/152162/download  This test is no t yet approved or cleared by the [place] FDA and  has been authorized for detection and/or diagnosis of SARS-CoV-2 by FDA under an Emergency Use Authorization (EUA). This EUA will remain  in effect (meaning this test can be used) for the duration of the COVID-19 declaration under Section 564(b)(1) of the Act, 21 U.S.C.section 360bbb-3(b)(1), unless the authorization is terminated  or revoked sooner.       Influenza A by PCR NEGATIVE NEGATIVE Final   Influenza B by PCR NEGATIVE NEGATIVE Final    Comment: (NOTE) The Xpert Xpress SARS-CoV-2/FLU/RSV plus assay is intended as an aid in the diagnosis of influenza from Nasopharyngeal swab specimens and should not be used as a sole basis for treatment. Nasal washings and aspirates are unacceptable for Xpert Xpress SARS-CoV-2/FLU/RSV testing.  Fact Sheet for Patients: BloggerCourse.comhttps://www.fda.gov/media/152166/download  Fact Sheet for Healthcare Providers: SeriousBroker.ithttps://www.fda.gov/media/152162/download  This test is not yet approved or cleared by the [place] FDA and has been authorized for detection and/or diagnosis of SARS-CoV-2 by FDA under an Emergency Use Authorization (EUA). This EUA will remain in effect (meaning this test can be used) for the duration of the COVID-19 declaration under Section 564(b)(1) of the Act, 21 U.S.C. section 360bbb-3(b)(1), unless the authorization is terminated or revoked.     Resp Syncytial Virus by PCR NEGATIVE NEGATIVE Final    Comment: (NOTE) Fact Sheet for Patients: BloggerCourse.comhttps://www.fda.gov/media/152166/download  Fact Sheet for Healthcare Providers: SeriousBroker.ithttps://www.fda.gov/media/152162/download  This test is not yet approved or cleared by the [place] FDA and has been  authorized for detection and/or diagnosis of SARS-CoV-2 by FDA under an Emergency Use Authorization (EUA). This EUA will remain in effect (meaning this test can be used) for the duration of the COVID-19 declaration under Section 564(b)(1) of the Act, 21 U.S.C. section 360bbb-3(b)(1), unless the authorization is terminated or revoked.  Performed at [facility], [address] [place], [place] [address]   Blood culture (routine x 2)     Status: Abnormal   Collection Time: 11/18/22 12:07 PM   Specimen: BLOOD  Result Value Ref Range Status   Specimen Description BLOOD SITE NOT SPECIFIED  Final   Special Requests   Final    BOTTLES DRAWN AEROBIC AND ANAEROBIC Blood Culture adequate volume   Culture  Setup Time   Final    GRAM POSITIVE COCCI IN BOTH AEROBIC AND ANAEROBIC BOTTLES CRITICAL RESULT CALLED TO, READ BACK BY AND VERIFIED WITH: PHARMD G. ABBOTT 11/19/22 @ 338 BY AB Performed at [facility], [address] [place], [place] [address]    Culture METHICILLIN RESISTANT STAPHYLOCOCCUS AUREUS (A)  Final   Report Status 11/21/2022 FINAL  Final   Organism ID, Bacteria METHICILLIN RESISTANT STAPHYLOCOCCUS AUREUS  Final      Susceptibility   Methicillin resistant staphylococcus aureus - MIC*    CIPROFLOXACIN >=8 RESISTANT Resistant     ERYTHROMYCIN >=8 RESISTANT Resistant     GENTAMICIN <=0.5 SENSITIVE Sensitive     OXACILLIN >=4 RESISTANT Resistant     TETRACYCLINE <=1 SENSITIVE Sensitive  VANCOMYCIN <=0.5 SENSITIVE Sensitive     TRIMETH/SULFA <=10 SENSITIVE Sensitive     CLINDAMYCIN >=8 RESISTANT Resistant     RIFAMPIN <=0.5 SENSITIVE Sensitive     Inducible Clindamycin NEGATIVE Sensitive     * METHICILLIN RESISTANT STAPHYLOCOCCUS AUREUS  Blood Culture ID Panel (Reflexed)     Status: Abnormal   Collection Time: 11/18/22 12:07 PM  Result Value Ref Range Status   Enterococcus faecalis NOT DETECTED NOT DETECTED Final   Enterococcus Faecium NOT DETECTED NOT  DETECTED Final   Listeria monocytogenes NOT DETECTED NOT DETECTED Final   Staphylococcus species DETECTED (A) NOT DETECTED Final    Comment: CRITICAL RESULT CALLED TO, READ BACK BY AND VERIFIED WITH: PHARMD G. ABBOTT 11/19/22 @ 338 BY AB    Staphylococcus aureus (BCID) DETECTED (A) NOT DETECTED Final    Comment: Methicillin (oxacillin)-resistant Staphylococcus aureus (MRSA). MRSA is predictably resistant to beta-lactam antibiotics (except ceftaroline). Preferred therapy is vancomycin unless clinically contraindicated. Patient requires contact precautions if  hospitalized. CRITICAL RESULT CALLED TO, READ BACK BY AND VERIFIED WITH: PHARMD G. ABBOTT 11/19/22 @ 338 BY AB    Staphylococcus epidermidis NOT DETECTED NOT DETECTED Final   Staphylococcus lugdunensis NOT DETECTED NOT DETECTED Final   Streptococcus species NOT DETECTED NOT DETECTED Final   Streptococcus agalactiae NOT DETECTED NOT DETECTED Final   Streptococcus pneumoniae NOT DETECTED NOT DETECTED Final   Streptococcus pyogenes NOT DETECTED NOT DETECTED Final   A.calcoaceticus-baumannii NOT DETECTED NOT DETECTED Final   Bacteroides fragilis NOT DETECTED NOT DETECTED Final   Enterobacterales NOT DETECTED NOT DETECTED Final   Enterobacter cloacae complex NOT DETECTED NOT DETECTED Final   Escherichia coli NOT DETECTED NOT DETECTED Final   Klebsiella aerogenes NOT DETECTED NOT DETECTED Final   Klebsiella oxytoca NOT DETECTED NOT DETECTED Final   Klebsiella pneumoniae NOT DETECTED NOT DETECTED Final   Proteus species NOT DETECTED NOT DETECTED Final   Salmonella species NOT DETECTED NOT DETECTED Final   Serratia marcescens NOT DETECTED NOT DETECTED Final   Haemophilus influenzae NOT DETECTED NOT DETECTED Final   Neisseria meningitidis NOT DETECTED NOT DETECTED Final   Pseudomonas aeruginosa NOT DETECTED NOT DETECTED Final   Stenotrophomonas maltophilia NOT DETECTED NOT DETECTED Final   Candida albicans NOT DETECTED NOT DETECTED Final    Candida auris NOT DETECTED NOT DETECTED Final   Candida glabrata NOT DETECTED NOT DETECTED Final   Candida krusei NOT DETECTED NOT DETECTED Final   Candida parapsilosis NOT DETECTED NOT DETECTED Final   Candida tropicalis NOT DETECTED NOT DETECTED Final   Cryptococcus neoformans/gattii NOT DETECTED NOT DETECTED Final   Meth resistant mecA/C and MREJ DETECTED (A) NOT DETECTED Final    Comment: CRITICAL RESULT CALLED TO, READ BACK BY AND VERIFIED WITH: PHARMD G. ABBOTT 11/19/22 @ 338 BY AB Performed at [facility], [address] [place], [place] [address]   Blood culture (routine x 2)     Status: None (Preliminary result)   Collection Time: 11/19/22  3:16 AM   Specimen: BLOOD  Result Value Ref Range Status   Specimen Description BLOOD BLOOD LEFT HAND  Final   Special Requests   Final    BOTTLES DRAWN AEROBIC AND ANAEROBIC Blood Culture adequate volume   Culture   Final    NO GROWTH 4 DAYS Performed at [facility], [address] [place], [place] [address]    Report Status PENDING  Incomplete  Culture, blood (Routine X 2) w Reflex to ID Panel  Status: Abnormal   Collection Time: 11/20/22  2:20 AM   Specimen: BLOOD LEFT HAND  Result Value Ref Range Status   Specimen Description BLOOD LEFT HAND  Final   Special Requests IN PEDIATRIC BOTTLE Blood Culture adequate volume  Final   Culture  Setup Time   Final    AEROBIC BOTTLE ONLY GRAM POSITIVE COCCI IN CLUSTERS CRITICAL VALUE NOTED.  VALUE IS CONSISTENT WITH PREVIOUSLY REPORTED AND CALLED VALUE.    Culture (A)  Final    STAPHYLOCOCCUS AUREUS SUSCEPTIBILITIES PERFORMED ON PREVIOUS CULTURE WITHIN THE LAST 5 DAYS. Performed at [facility], [address], [place], [place] [address]    Report Status 11/21/2022 FINAL  Final  Culture, blood (Routine X 2) w Reflex to ID Panel     Status: Abnormal   Collection Time: 11/20/22  6:09 AM   Specimen: BLOOD LEFT HAND  Result Value Ref Range Status   Specimen  Description BLOOD LEFT HAND  Final   Special Requests   Final    BOTTLES DRAWN AEROBIC ONLY Blood Culture results may not be optimal due to an inadequate volume of blood received in culture bottles   Culture  Setup Time   Final    AEROBIC BOTTLE ONLY GRAM POSITIVE COCCI IN CLUSTERS CRITICAL VALUE NOTED.  VALUE IS CONSISTENT WITH PREVIOUSLY REPORTED AND CALLED VALUE.    Culture (A)  Final    STAPHYLOCOCCUS AUREUS SUSCEPTIBILITIES PERFORMED ON PREVIOUS CULTURE WITHIN THE LAST 5 DAYS. Performed at [facility], [address] [place], [place] [address]    Report Status 11/21/2022 FINAL  Final  Culture, blood (Routine X 2) w Reflex to ID Panel     Status: Abnormal   Collection Time: 11/21/22 10:04 AM   Specimen: BLOOD LEFT HAND  Result Value Ref Range Status   Specimen Description BLOOD LEFT HAND  Final   Special Requests   Final    BOTTLES DRAWN AEROBIC AND ANAEROBIC Blood Culture adequate volume   Culture  Setup Time   Final    GRAM POSITIVE COCCI IN BOTH AEROBIC AND ANAEROBIC BOTTLES CRITICAL VALUE NOTED.  VALUE IS CONSISTENT WITH PREVIOUSLY REPORTED AND CALLED VALUE.    Culture (A)  Final    STAPHYLOCOCCUS AUREUS SUSCEPTIBILITIES PERFORMED ON PREVIOUS CULTURE WITHIN THE LAST 5 DAYS. Performed at [facility], [address], [place], [place] [address]    Report Status 11/24/2022 FINAL  Final  Culture, blood (Routine X 2) w Reflex to ID Panel     Status: Abnormal   Collection Time: 11/21/22 10:14 AM   Specimen: BLOOD  Result Value Ref Range Status   Specimen Description BLOOD RIGHT ANTECUBITAL  Final   Special Requests   Final    BOTTLES DRAWN AEROBIC ONLY Blood Culture adequate volume   Culture  Setup Time   Final    GRAM POSITIVE COCCI AEROBIC BOTTLE ONLY Gram Stain Report Called to,Read Back By and Verified With: PHARMD G. ABBOTT 11/21/22 @2321  BY AB    Culture (A)  Final    STAPHYLOCOCCUS AUREUS SUSCEPTIBILITIES PERFORMED ON PREVIOUS CULTURE WITHIN THE  LAST 5 DAYS. Performed at [facility], [address] [place], [place]    Report Status 11/24/2022 FINAL  Final  Culture, blood (Routine X 2) w Reflex to ID Panel     Status: Abnormal (Preliminary result)   Collection Time: 11/22/22 12:03 PM   Specimen: BLOOD LEFT HAND  Result Value Ref Range Status  Specimen Description BLOOD LEFT HAND  Final   Special Requests   Final    BOTTLES DRAWN AEROBIC AND ANAEROBIC Blood Culture adequate volume   Culture  Setup Time   Final    GRAM POSITIVE COCCI IN CLUSTERS ANAEROBIC BOTTLE ONLY CRITICAL VALUE NOTED.  VALUE IS CONSISTENT WITH PREVIOUSLY REPORTED AND CALLED VALUE.    Culture (A)  Final    STAPHYLOCOCCUS AUREUS SUSCEPTIBILITIES PERFORMED ON PREVIOUS CULTURE WITHIN THE LAST 5 DAYS. Performed at [facility], [place] [address], [place], [place] [address]    Report Status PENDING  Incomplete  Culture, blood (Routine X 2) w Reflex to ID Panel     Status: Abnormal (Preliminary result)   Collection Time: 11/22/22 12:24 PM   Specimen: BLOOD RIGHT HAND  Result Value Ref Range Status   Specimen Description BLOOD RIGHT HAND  Final   Special Requests   Final    BOTTLES DRAWN AEROBIC AND ANAEROBIC Blood Culture adequate volume   Culture  Setup Time   Final    GRAM POSITIVE COCCI IN CLUSTERS IN BOTH AEROBIC AND ANAEROBIC BOTTLES Gram Stain Report Called to,Read Back By and Verified With: PHARMD LEDFORD 11/23/22 @ 0512 BY AB    Culture (A)  Final    STAPHYLOCOCCUS AUREUS SUSCEPTIBILITIES PERFORMED ON PREVIOUS CULTURE WITHIN THE LAST 5 DAYS. Performed at [facility], [place] [address] [place], [place] [address]    Report Status PENDING  Incomplete  Culture, blood (Routine X 2) w Reflex to ID Panel     Status: None (Preliminary result)   Collection Time: 11/23/22  1:03 PM   Specimen: BLOOD LEFT ARM  Result Value Ref Range Status   Specimen Description BLOOD LEFT ARM  Final   Special Requests   Final    BOTTLES DRAWN  AEROBIC ONLY Blood Culture adequate volume   Culture   Final    NO GROWTH <12 HOURS Performed at [facility], [place] [address] [place], [place] [address]    Report Status PENDING  Incomplete  Culture, blood (Routine X 2) w Reflex to ID Panel     Status: None (Preliminary result)   Collection Time: 11/23/22  1:04 PM   Specimen: BLOOD LEFT ARM  Result Value Ref Range Status   Specimen Description BLOOD LEFT ARM  Final   Special Requests   Final    BOTTLES DRAWN AEROBIC ONLY Blood Culture adequate volume   Culture   Final    NO GROWTH <12 HOURS Performed at [facility], [place] [address] [place],  [address]    Report Status PENDING  Incomplete   Pertinent Lab.    Latest Ref Rng & Units 11/19/2022    3:16 AM 11/18/2022    8:28 AM 03/24/2020    4:37 AM  CBC  WBC 4.0 - 10.5 K/uL 15.6  15.6  20.3   Hemoglobin 12.0 - 15.0 g/dL 11.2  12.6  12.4   Hematocrit 36.0 - 46.0 % 31.8  36.4  35.8   Platelets 150 - 400 K/uL 194  216  425       Latest Ref Rng & Units 11/24/2022   10:20 AM 11/23/2022    8:09 AM 11/20/2022    2:20 AM  CMP  Glucose 70 - 99 mg/dL 102  101  99   BUN 6 - 20 mg/dL 6  6  9    Creatinine 0.44 - 1.00 mg/dL 0.57  0.50  0.60   Sodium 135 -  145 mmol/L 134  135  134   Potassium 3.5 - 5.1 mmol/L 3.6  4.3  4.3   Chloride 98 - 111 mmol/L 100  102  102   CO2 22 - 32 mmol/L 25  24  23    Calcium 8.9 - 10.3 mg/dL 8.0  7.7  8.2      Pertinent Imaging today Plain films and CT images have been personally visualized and interpreted; radiology reports have been reviewed. Decision making incorporated into the Impression / Recommendations.  No results found.   I spent 35 minutes for this patient encounter including review of prior medical records, coordination of care with primary/other specialist with greater than 50% of time being face to face/counseling and discussing diagnostics/treatment plan with the patient/family.  Electronically signed by:   , MD Infectious Disease Physician [facility] for Infectious Disease Pager: [phone]

## 2022-11-24 NOTE — Plan of Care (Signed)
  Problem: Education: ?Goal: Knowledge of General Education information will improve ?Description: Including pain rating scale, medication(s)/side effects and non-pharmacologic comfort measures ?Outcome: Progressing ?  ?Problem: Health Behavior/Discharge Planning: ?Goal: Ability to manage health-related needs will improve ?Outcome: Progressing ?  ?Problem: Clinical Measurements: ?Goal: Will remain free from infection ?Outcome: Progressing ?  ?

## 2022-11-24 NOTE — Interval H&P Note (Signed)
History and Physical Interval Note:  11/24/2022 12:18 PM  Cassandra Roberts  has presented today for surgery, with the diagnosis of BACTOREMIA (DRUG IV USE).  The various methods of treatment have been discussed with the patient and family. After consideration of risks, benefits and other options for treatment, the patient has consented to  Procedure(s): TRANSESOPHAGEAL ECHOCARDIOGRAM (TEE) (N/A) as a surgical intervention.  The patient's history has been reviewed, patient examined, no change in status, stable for surgery.  I have reviewed the patient's chart and labs.  Questions were answered to the patient's satisfaction.     [employer]

## 2022-11-24 NOTE — Progress Notes (Signed)
  Echocardiogram Echocardiogram Transesophageal has been performed.  Cassandra Roberts 11/24/2022, 1:03 PM

## 2022-11-24 NOTE — Transfer of Care (Signed)
Immediate Anesthesia Transfer of Care Note  Patient: Cassandra Roberts  Procedure(s) Performed: TRANSESOPHAGEAL ECHOCARDIOGRAM (TEE)  Patient Location: PACU and Endoscopy Unit  Anesthesia Type:MAC  Level of Consciousness: awake and alert   Airway & Oxygen Therapy: Patient Spontanous Breathing and Patient connected to nasal cannula oxygen  Post-op Assessment: Report given to RN and Post -op Vital signs reviewed and stable  Post vital signs: Reviewed and stable  Last Vitals:  Vitals Value Taken Time  BP 122/76    Temp    Pulse 94 11/24/22 1249  Resp 23 11/24/22 1249  SpO2 97 % 11/24/22 1249  Vitals shown include unvalidated device data.  Last Pain:  Vitals:   11/24/22 1108  TempSrc: Temporal  PainSc: 8       Patients Stated Pain Goal: 0 (37/44/51 4604)  Complications: No notable events documented.

## 2022-11-24 NOTE — Anesthesia Postprocedure Evaluation (Signed)
Anesthesia Post Note  Patient: Scientist, product/process development  Procedure(s) Performed: TRANSESOPHAGEAL ECHOCARDIOGRAM (TEE)     Patient location during evaluation: Endoscopy Anesthesia Type: MAC Level of consciousness: awake and alert, patient cooperative and oriented Pain management: pain level controlled Vital Signs Assessment: post-procedure vital signs reviewed and stable Respiratory status: nonlabored ventilation, spontaneous breathing and respiratory function stable Cardiovascular status: blood pressure returned to baseline and stable Postop Assessment: no apparent nausea or vomiting Anesthetic complications: no   No notable events documented.  Last Vitals:  Vitals:   11/24/22 1300 11/24/22 1310  BP: 128/78 122/81  Pulse: 88 86  Resp: (!) 25 20  Temp:    SpO2: 96% 98%    Last Pain:  Vitals:   11/24/22 1246  TempSrc: Temporal  PainSc: Asleep                 Araly Kaas,E. Kyrstal Monterrosa

## 2022-11-24 NOTE — CV Procedure (Signed)
   Transesophageal Echocardiogram  Indications: Bacteremia  Time out performed  During this procedure the patient was administered propofol under anesthesiology supervision to achieve and maintain moderate sedation.  The patient's heart rate, blood pressure, and oxygen saturation are monitored continuously during the procedure.   Findings:  Left Ventricle: Normal EF 65%  Mitral Valve: Normal  Aortic Valve: Normal  Tricuspid Valve: Normal  Left Atrium: Normal, no left atrial appendage thrombus  Right Atrium: Normal  Intraatrial septum: Small PFO  Bubble Contrast Study: Positive, mild bubble crossover, small PFO  Impression: No vegetation, no endocarditis.  Small PFO  Candee Furbish, MD

## 2022-11-24 NOTE — H&P (View-Only) (Signed)
PROGRESS NOTE        PATIENT DETAILS Name: Cassandra BussingChrista Roberts Age: 32 y.o. Sex: female Date of Birth: Oct 27, 1991 Admit Date: 11/18/2022 Admitting Physician Marinda ElkAbraham Feliz Ortiz, MD ZOX:[id number]:Patient, No Pcp Per  Brief Summary: Patient is a 32 y.o.  female with history of IVDA-recent hospitalization [facility] for multifocal pneumonia-left AMA-presented with chest tightness/shortness of breath-she was found to have sepsis due to multifocal pneumonia-and subsequently blood cultures positive for MRSA.  Significant events: 1/11>> admit to [facility]-sepsis-multifocal PNA-blood cultures subsequently positive for MRSA.  Significant studies: 1/11>> CT chest: Multifocal lung consolidation-greatest in the left side. 1/12>> TTE: EF 70-75%-no obvious vegetation 1/15>> MRI left shoulder: Moderate ill-defined fluid between the deltoid, infraspinatus and teres minor muscles.  No well-defined fluid collection identified. 1/15>> MRI left femur: Motion degraded exam-nondiagnostic exam. 1/16>> MRI left hip: Mild to moderate fluid tracking along the anterior aspect of the left iliopsoas junction-nonspecific-may represent bursitis.  Significant microbiology data: 1/11>> COVID/influenza/RSV PCR: Negative 1/11>> blood culture: MRSA 1/13>> blood culture: MRSA 1/14>> blood culture: MRSA 1/15>> blood culture: Negative 1/16>> blood culture: Negative  Procedures: None  Consults: ID Orthopedics  Subjective: Lying comfortably in bed-denies any chest pain or shortness of breath.  Objective: Vitals: Blood pressure 115/76, pulse 86, temperature 99.5 F (37.5 C), temperature source Oral, resp. rate 18, height 5\' 2"  (1.575 m), weight 56.7 kg, last menstrual period 11/04/2022, SpO2 100 %.   Exam: Gen Exam:Alert awake-not in any distress HEENT:atraumatic, normocephalic Chest: B/L clear to auscultation anteriorly CVS:S1S2 regular Abdomen:soft non tender, non  distended Extremities:no edema Neurology: Non focal Skin: no rash  Pertinent Labs/Radiology:    Latest Ref Rng & Units 11/19/2022    3:16 AM 11/18/2022    8:28 AM 03/24/2020    4:37 AM  CBC  WBC 4.0 - 10.5 K/uL 15.6  15.6  20.3   Hemoglobin 12.0 - 15.0 g/dL 09.811.2  11.912.6  14.712.4   Hematocrit 36.0 - 46.0 % 31.8  36.4  35.8   Platelets 150 - 400 K/uL 194  216  425     Lab Results  Component Value Date   NA 135 11/23/2022   K 4.3 11/23/2022   CL 102 11/23/2022   CO2 24 11/23/2022      Assessment/Plan: Sepsis secondary to MRSA bacteremia/septic emboli Sepsis physiology resolved Blood cultures on 1/15, 1/16 negative For TEE today No drainable fluid collection seen on MRI studies of left hip/left shoulder Continue as needed narcotics for pain issues. Clearly not a candidate for outpatient IV antibiotics-will need to stay inpatient to complete IV antibiotic therapy.  History of polysubstance abuse/IVDU (UDS positive for cocaine/amphetamines) Counseled  Chronic HCV infection Outpatient management  BMI: Estimated body mass index is 22.86 kg/m as calculated from the following:   Height as of this encounter: 5\' 2"  (1.575 m).   Weight as of this encounter: 56.7 kg.   Code status:   Code Status: Full Code   DVT Prophylaxis: enoxaparin (LOVENOX) injection 40 mg Start: 11/18/22 1930   Family Communication: None at bedside   Disposition Plan: Status is: Inpatient Remains inpatient appropriate because: Severity of illness.   Planned Discharge Destination:Home   Diet: Diet Order             Diet NPO time specified  Diet effective midnight  Antimicrobial agents: Anti-infectives (From admission, onward)    Start     Dose/Rate Route Frequency Ordered Stop   11/23/22 1400  DAPTOmycin (CUBICIN) 500 mg in sodium chloride 0.9 % IVPB        500 mg 120 mL/hr over 30 Minutes Intravenous Daily 11/23/22 0921     11/23/22 1000  ceftaroline (TEFLARO) 600  mg in sodium chloride 0.9 % 100 mL IVPB        600 mg 100 mL/hr over 60 Minutes Intravenous Every 8 hours 11/23/22 0921     11/22/22 1445  vancomycin (VANCOCIN) IVPB 1000 mg/200 mL premix  Status:  Discontinued        1,000 mg 200 mL/hr over 60 Minutes Intravenous Every 8 hours 11/22/22 1347 11/23/22 0921   11/19/22 0200  vancomycin (VANCOCIN) IVPB 750 mg/150 ml premix  Status:  Discontinued        750 mg 150 mL/hr over 60 Minutes Intravenous Every 12 hours 11/18/22 1354 11/19/22 0110   11/19/22 0200  vancomycin (VANCOREADY) IVPB 750 mg/150 mL  Status:  Discontinued        750 mg 150 mL/hr over 60 Minutes Intravenous Every 12 hours 11/19/22 0110 11/22/22 1347   11/19/22 0000  linezolid (ZYVOX) 600 MG tablet        600 mg Oral 2 times daily 11/19/22 1045 12/31/22 2359   11/18/22 1330  ceFEPIme (MAXIPIME) 2 g in sodium chloride 0.9 % 100 mL IVPB        2 g 200 mL/hr over 30 Minutes Intravenous  Once 11/18/22 1324 11/18/22 1408   11/18/22 1330  Vancomycin (VANCOCIN) 1,250 mg in sodium chloride 0.9 % 250 mL IVPB        1,250 mg 166.7 mL/hr over 90 Minutes Intravenous  Once 11/18/22 1329 11/18/22 1652        MEDICATIONS: Scheduled Meds:  enoxaparin (LOVENOX) injection  40 mg Subcutaneous Q24H   metoprolol tartrate  25 mg Oral BID   morphine  30 mg Oral Q12H   nicotine  14 mg Transdermal Daily   Continuous Infusions:  sodium chloride     ceFTAROline (TEFLARO) IV 600 mg (11/23/22 1812)   DAPTOmycin (CUBICIN) 500 mg in sodium chloride 0.9 % IVPB Stopped (11/23/22 1447)   PRN Meds:.acetaminophen **OR** acetaminophen, metoprolol tartrate, ondansetron **OR** ondansetron (ZOFRAN) IV, polyethylene glycol   I have personally reviewed following labs and imaging studies  LABORATORY DATA: CBC: Recent Labs  Lab 11/18/22 0828 11/19/22 0316  WBC 15.6* 15.6*  HGB 12.6 11.2*  HCT 36.4 31.8*  MCV 87.3 85.9  PLT 216 194    Basic Metabolic Panel: Recent Labs  Lab 11/18/22 0828  11/19/22 0316 11/20/22 0220 11/23/22 0809  NA 130* 134* 134* 135  K 3.7 3.1* 4.3 4.3  CL 97* 101 102 102  CO2 23 24 23 24   GLUCOSE 125* 149* 99 101*  BUN 5* 7 9 6   CREATININE 0.59 0.67 0.60 0.50  CALCIUM 8.6* 8.0* 8.2* 7.7*    GFR: Estimated Creatinine Clearance: 80.6 mL/min (by C-G formula based on SCr of 0.5 mg/dL).  Liver Function Tests: Recent Labs  Lab 11/19/22 0316  AST 19  ALT 42  ALKPHOS 162*  BILITOT 0.9  PROT 5.6*  ALBUMIN 2.3*   No results for input(s): "LIPASE", "AMYLASE" in the last 168 hours. No results for input(s): "AMMONIA" in the last 168 hours.  Coagulation Profile: No results for input(s): "INR", "PROTIME" in the last 168 hours.  Cardiac Enzymes: Recent Labs  Lab 11/23/22 1303  CKTOTAL 16*    BNP (last 3 results) No results for input(s): "PROBNP" in the last 8760 hours.  Lipid Profile: No results for input(s): "CHOL", "HDL", "LDLCALC", "TRIG", "CHOLHDL", "LDLDIRECT" in the last 72 hours.  Thyroid Function Tests: No results for input(s): "TSH", "T4TOTAL", "FREET4", "T3FREE", "THYROIDAB" in the last 72 hours.  Anemia Panel: No results for input(s): "VITAMINB12", "FOLATE", "FERRITIN", "TIBC", "IRON", "RETICCTPCT" in the last 72 hours.  Urine analysis:    Component Value Date/Time   COLORURINE YELLOW 11/18/2022 1020   APPEARANCEUR CLOUDY (A) 11/18/2022 1020   LABSPEC 1.009 11/18/2022 1020   PHURINE 6.0 11/18/2022 1020   GLUCOSEU NEGATIVE 11/18/2022 1020   HGBUR SMALL (A) 11/18/2022 1020   BILIRUBINUR NEGATIVE 11/18/2022 1020   KETONESUR NEGATIVE 11/18/2022 1020   PROTEINUR NEGATIVE 11/18/2022 1020   UROBILINOGEN 0.2 07/02/2015 1044   NITRITE POSITIVE (A) 11/18/2022 1020   LEUKOCYTESUR MODERATE (A) 11/18/2022 1020    Sepsis Labs: Lactic Acid, Venous    Component Value Date/Time   LATICACIDVEN 1.4 11/18/2022 1303    MICROBIOLOGY: Recent Results (from the past 240 hour(s))  Resp panel by RT-PCR (RSV, Flu A&B, Covid) Anterior  Nasal Swab     Status: None   Collection Time: 11/18/22 10:08 AM   Specimen: Anterior Nasal Swab  Result Value Ref Range Status   SARS Coronavirus 2 by RT PCR NEGATIVE NEGATIVE Final    Comment: (NOTE) SARS-CoV-2 target nucleic acids are NOT DETECTED.  The SARS-CoV-2 RNA is generally detectable in upper respiratory specimens during the acute phase of infection. The lowest concentration of SARS-CoV-2 viral copies this assay can detect is 138 copies/mL. A negative result does not preclude SARS-Cov-2 infection and should not be used as the sole basis for treatment or other patient management decisions. A negative result may occur with  improper specimen collection/handling, submission of specimen other than nasopharyngeal swab, presence of viral mutation(s) within the areas targeted by this assay, and inadequate number of viral copies(<138 copies/mL). A negative result must be combined with clinical observations, patient history, and epidemiological information. The expected result is Negative.  Fact Sheet for Patients:  BloggerCourse.com  Fact Sheet for Healthcare Providers:  SeriousBroker.it  This test is no t yet approved or cleared by the [place] FDA and  has been authorized for detection and/or diagnosis of SARS-CoV-2 by FDA under an Emergency Use Authorization (EUA). This EUA will remain  in effect (meaning this test can be used) for the duration of the COVID-19 declaration under Section 564(b)(1) of the Act, 21 U.S.C.section 360bbb-3(b)(1), unless the authorization is terminated  or revoked sooner.       Influenza A by PCR NEGATIVE NEGATIVE Final   Influenza B by PCR NEGATIVE NEGATIVE Final    Comment: (NOTE) The Xpert Xpress SARS-CoV-2/FLU/RSV plus assay is intended as an aid in the diagnosis of influenza from Nasopharyngeal swab specimens and should not be used as a sole basis for treatment. Nasal washings  and aspirates are unacceptable for Xpert Xpress SARS-CoV-2/FLU/RSV testing.  Fact Sheet for Patients: BloggerCourse.com  Fact Sheet for Healthcare Providers: SeriousBroker.it  This test is not yet approved or cleared by the [place] FDA and has been authorized for detection and/or diagnosis of SARS-CoV-2 by FDA under an Emergency Use Authorization (EUA). This EUA will remain in effect (meaning this test can be used) for the duration of the COVID-19 declaration under Section 564(b)(1) of the Act, 21 U.S.C. section 360bbb-3(b)(1), unless the authorization is terminated or  revoked.     Resp Syncytial Virus by PCR NEGATIVE NEGATIVE Final    Comment: (NOTE) Fact Sheet for Patients: EntrepreneurPulse.com.au  Fact Sheet for Healthcare Providers: IncredibleEmployment.be  This test is not yet approved or cleared by the [place] FDA and has been authorized for detection and/or diagnosis of SARS-CoV-2 by FDA under an Emergency Use Authorization (EUA). This EUA will remain in effect (meaning this test can be used) for the duration of the COVID-19 declaration under Section 564(b)(1) of the Act, 21 U.S.C. section 360bbb-3(b)(1), unless the authorization is terminated or revoked.  Performed at [facility], [place] [address] [place], [place] [address]   Blood culture (routine x 2)     Status: Abnormal   Collection Time: 11/18/22 12:07 PM   Specimen: BLOOD  Result Value Ref Range Status   Specimen Description BLOOD SITE NOT SPECIFIED  Final   Special Requests   Final    BOTTLES DRAWN AEROBIC AND ANAEROBIC Blood Culture adequate volume   Culture  Setup Time   Final    GRAM POSITIVE COCCI IN BOTH AEROBIC AND ANAEROBIC BOTTLES CRITICAL RESULT CALLED TO, READ BACK BY AND VERIFIED WITH: PHARMD G. ABBOTT 11/19/22 @ 338 BY AB Performed at [facility], [place] [address], [place], [place]  [address]    Culture METHICILLIN RESISTANT STAPHYLOCOCCUS AUREUS (A)  Final   Report Status 11/21/2022 FINAL  Final   Organism ID, Bacteria METHICILLIN RESISTANT STAPHYLOCOCCUS AUREUS  Final      Susceptibility   Methicillin resistant staphylococcus aureus - MIC*    CIPROFLOXACIN >=8 RESISTANT Resistant     ERYTHROMYCIN >=8 RESISTANT Resistant     GENTAMICIN <=0.5 SENSITIVE Sensitive     OXACILLIN >=4 RESISTANT Resistant     TETRACYCLINE <=1 SENSITIVE Sensitive     VANCOMYCIN <=0.5 SENSITIVE Sensitive     TRIMETH/SULFA <=10 SENSITIVE Sensitive     CLINDAMYCIN >=8 RESISTANT Resistant     RIFAMPIN <=0.5 SENSITIVE Sensitive     Inducible Clindamycin NEGATIVE Sensitive     * METHICILLIN RESISTANT STAPHYLOCOCCUS AUREUS  Blood Culture ID Panel (Reflexed)     Status: Abnormal   Collection Time: 11/18/22 12:07 PM  Result Value Ref Range Status   Enterococcus faecalis NOT DETECTED NOT DETECTED Final   Enterococcus Faecium NOT DETECTED NOT DETECTED Final   Listeria monocytogenes NOT DETECTED NOT DETECTED Final   Staphylococcus species DETECTED (A) NOT DETECTED Final    Comment: CRITICAL RESULT CALLED TO, READ BACK BY AND VERIFIED WITH: PHARMD G. ABBOTT 11/19/22 @ 338 BY AB    Staphylococcus aureus (BCID) DETECTED (A) NOT DETECTED Final    Comment: Methicillin (oxacillin)-resistant Staphylococcus aureus (MRSA). MRSA is predictably resistant to beta-lactam antibiotics (except ceftaroline). Preferred therapy is vancomycin unless clinically contraindicated. Patient requires contact precautions if  hospitalized. CRITICAL RESULT CALLED TO, READ BACK BY AND VERIFIED WITH: PHARMD G. ABBOTT 11/19/22 @ 338 BY AB    Staphylococcus epidermidis NOT DETECTED NOT DETECTED Final   Staphylococcus lugdunensis NOT DETECTED NOT DETECTED Final   Streptococcus species NOT DETECTED NOT DETECTED Final   Streptococcus agalactiae NOT DETECTED NOT DETECTED Final   Streptococcus pneumoniae NOT DETECTED NOT DETECTED Final    Streptococcus pyogenes NOT DETECTED NOT DETECTED Final   A.calcoaceticus-baumannii NOT DETECTED NOT DETECTED Final   Bacteroides fragilis NOT DETECTED NOT DETECTED Final   Enterobacterales NOT DETECTED NOT DETECTED Final   Enterobacter cloacae complex NOT DETECTED NOT DETECTED Final   Escherichia coli NOT DETECTED NOT DETECTED Final  Klebsiella aerogenes NOT DETECTED NOT DETECTED Final   Klebsiella oxytoca NOT DETECTED NOT DETECTED Final   Klebsiella pneumoniae NOT DETECTED NOT DETECTED Final   Proteus species NOT DETECTED NOT DETECTED Final   Salmonella species NOT DETECTED NOT DETECTED Final   Serratia marcescens NOT DETECTED NOT DETECTED Final   Haemophilus influenzae NOT DETECTED NOT DETECTED Final   Neisseria meningitidis NOT DETECTED NOT DETECTED Final   Pseudomonas aeruginosa NOT DETECTED NOT DETECTED Final   Stenotrophomonas maltophilia NOT DETECTED NOT DETECTED Final   Candida albicans NOT DETECTED NOT DETECTED Final   Candida auris NOT DETECTED NOT DETECTED Final   Candida glabrata NOT DETECTED NOT DETECTED Final   Candida krusei NOT DETECTED NOT DETECTED Final   Candida parapsilosis NOT DETECTED NOT DETECTED Final   Candida tropicalis NOT DETECTED NOT DETECTED Final   Cryptococcus neoformans/gattii NOT DETECTED NOT DETECTED Final   Meth resistant mecA/C and MREJ DETECTED (A) NOT DETECTED Final    Comment: CRITICAL RESULT CALLED TO, READ BACK BY AND VERIFIED WITH: PHARMD G. ABBOTT 11/19/22 @ 338 BY AB Performed at [facility], [address] [place], [place] [address]   Blood culture (routine x 2)     Status: None (Preliminary result)   Collection Time: 11/19/22  3:16 AM   Specimen: BLOOD  Result Value Ref Range Status   Specimen Description BLOOD BLOOD LEFT HAND  Final   Special Requests   Final    BOTTLES DRAWN AEROBIC AND ANAEROBIC Blood Culture adequate volume   Culture   Final    NO GROWTH 4 DAYS Performed at [facility], [address]  [place], [place] [address]    Report Status PENDING  Incomplete  Culture, blood (Routine X 2) w Reflex to ID Panel     Status: Abnormal   Collection Time: 11/20/22  2:20 AM   Specimen: BLOOD LEFT HAND  Result Value Ref Range Status   Specimen Description BLOOD LEFT HAND  Final   Special Requests IN PEDIATRIC BOTTLE Blood Culture adequate volume  Final   Culture  Setup Time   Final    AEROBIC BOTTLE ONLY GRAM POSITIVE COCCI IN CLUSTERS CRITICAL VALUE NOTED.  VALUE IS CONSISTENT WITH PREVIOUSLY REPORTED AND CALLED VALUE.    Culture (A)  Final    STAPHYLOCOCCUS AUREUS SUSCEPTIBILITIES PERFORMED ON PREVIOUS CULTURE WITHIN THE LAST 5 DAYS. Performed at [facility], [address] [place], [place] [address]    Report Status 11/21/2022 FINAL  Final  Culture, blood (Routine X 2) w Reflex to ID Panel     Status: Abnormal   Collection Time: 11/20/22  6:09 AM   Specimen: BLOOD LEFT HAND  Result Value Ref Range Status   Specimen Description BLOOD LEFT HAND  Final   Special Requests   Final    BOTTLES DRAWN AEROBIC ONLY Blood Culture results may not be optimal due to an inadequate volume of blood received in culture bottles   Culture  Setup Time   Final    AEROBIC BOTTLE ONLY GRAM POSITIVE COCCI IN CLUSTERS CRITICAL VALUE NOTED.  VALUE IS CONSISTENT WITH PREVIOUSLY REPORTED AND CALLED VALUE.    Culture (A)  Final    STAPHYLOCOCCUS AUREUS SUSCEPTIBILITIES PERFORMED ON PREVIOUS CULTURE WITHIN THE LAST 5 DAYS. Performed at [facility], [address] [place], [place] [address]    Report Status 11/21/2022 FINAL  Final  Culture, blood (Routine X 2) w Reflex to ID Panel  Status: Abnormal   Collection Time: 11/21/22 10:04 AM   Specimen: BLOOD LEFT HAND  Result Value Ref Range Status   Specimen Description BLOOD LEFT HAND  Final   Special Requests   Final    BOTTLES DRAWN AEROBIC AND ANAEROBIC Blood Culture adequate volume   Culture  Setup Time   Final    GRAM POSITIVE COCCI IN  BOTH AEROBIC AND ANAEROBIC BOTTLES CRITICAL VALUE NOTED.  VALUE IS CONSISTENT WITH PREVIOUSLY REPORTED AND CALLED VALUE.    Culture (A)  Final    STAPHYLOCOCCUS AUREUS SUSCEPTIBILITIES PERFORMED ON PREVIOUS CULTURE WITHIN THE LAST 5 DAYS. Performed at [facility], [address] [place], [place] [address]    Report Status 11/24/2022 FINAL  Final  Culture, blood (Routine X 2) w Reflex to ID Panel     Status: Abnormal   Collection Time: 11/21/22 10:14 AM   Specimen: BLOOD  Result Value Ref Range Status   Specimen Description BLOOD RIGHT ANTECUBITAL  Final   Special Requests   Final    BOTTLES DRAWN AEROBIC ONLY Blood Culture adequate volume   Culture  Setup Time   Final    GRAM POSITIVE COCCI AEROBIC BOTTLE ONLY Gram Stain Report Called to,Read Back By and Verified With: PHARMD G. ABBOTT 11/21/22 @2321  BY AB    Culture (A)  Final    STAPHYLOCOCCUS AUREUS SUSCEPTIBILITIES PERFORMED ON PREVIOUS CULTURE WITHIN THE LAST 5 DAYS. Performed at [facility], [address] [place], [place] [address]    Report Status 11/24/2022 FINAL  Final  Culture, blood (Routine X 2) w Reflex to ID Panel     Status: None (Preliminary result)   Collection Time: 11/22/22 12:03 PM   Specimen: BLOOD LEFT HAND  Result Value Ref Range Status   Specimen Description BLOOD LEFT HAND  Final   Special Requests   Final    BOTTLES DRAWN AEROBIC AND ANAEROBIC Blood Culture adequate volume   Culture  Setup Time   Final    GRAM POSITIVE COCCI IN CLUSTERS ANAEROBIC BOTTLE ONLY CRITICAL VALUE NOTED.  VALUE IS CONSISTENT WITH PREVIOUSLY REPORTED AND CALLED VALUE. Performed at [facility], [address] [place], [place] [address]    Culture GRAM POSITIVE COCCI  Final   Report Status PENDING  Incomplete  Culture, blood (Routine X 2) w Reflex to ID Panel     Status: None (Preliminary result)   Collection Time: 11/22/22 12:24 PM   Specimen: BLOOD RIGHT HAND  Result Value Ref Range Status    Specimen Description BLOOD RIGHT HAND  Final   Special Requests   Final    BOTTLES DRAWN AEROBIC AND ANAEROBIC Blood Culture adequate volume   Culture  Setup Time   Final    GRAM POSITIVE COCCI IN CLUSTERS IN BOTH AEROBIC AND ANAEROBIC BOTTLES Gram Stain Report Called to,Read Back By and Verified With: PHARMD LEDFORD 11/23/22 @ 0512 BY AB Performed at [facility], [address] [place], [place] [address]    Culture GRAM POSITIVE COCCI  Final   Report Status PENDING  Incomplete  Culture, blood (Routine X 2) w Reflex to ID Panel     Status: None (Preliminary result)   Collection Time: 11/23/22  1:03 PM   Specimen: BLOOD LEFT ARM  Result Value Ref Range Status   Specimen Description BLOOD LEFT ARM  Final   Special Requests   Final    BOTTLES DRAWN AEROBIC ONLY Blood Culture adequate volume  Culture   Final    NO GROWTH <12 HOURS Performed at [facility], [place] [address], [place], [place] [address]    Report Status PENDING  Incomplete  Culture, blood (Routine X 2) w Reflex to ID Panel     Status: None (Preliminary result)   Collection Time: 11/23/22  1:04 PM   Specimen: BLOOD LEFT ARM  Result Value Ref Range Status   Specimen Description BLOOD LEFT ARM  Final   Special Requests   Final    BOTTLES DRAWN AEROBIC ONLY Blood Culture adequate volume   Culture   Final    NO GROWTH <12 HOURS Performed at [facility], [place] [address] [place], [place] [address]    Report Status PENDING  Incomplete    RADIOLOGY STUDIES/RESULTS: MR HIP LEFT W WO CONTRAST  Result Date: 11/23/2022 CLINICAL DATA:  Septic arthritis suspected. EXAM: MRI OF THE LEFT HIP WITHOUT AND WITH CONTRAST TECHNIQUE: Multiplanar, multisequence MR imaging was performed both before and after administration of intravenous contrast. CONTRAST:  70mL GADAVIST GADOBUTROL 1 MMOL/ML IV SOLN COMPARISON:  Limited MRI bilateral femurs (patient was unable to complete exam) 11/22/2022 FINDINGS: Bones: Mild pubic  symphysis joint space narrowing and peripheral osteophytosis. No acute fracture or avascular necrosis is seen within the visualized portions of the pelvis or either proximal femur. There is decreased T1 signal seen diffusely throughout the ilium, ischium, and pubis of the pelvis. This remains hyperintense to skeletal muscle. This is nonspecific but compatible with red marrow reconversion as can be seen in the settings of obesity, anemia, and/or smoking along with other etiologies. No cortical erosion is seen. Articular cartilage and labrum Articular cartilage: Mild thinning of the superior left acetabular and femoral head cartilage. Labrum: Lack of intra-articular fluid limits evaluation of the left acetabular labrum. No dedicated left hip axial fluid sensitive images were performed. No definitive labral tear is seen on the provided images. Joint or bursal effusion Joint effusion:  No joint effusion within either hip. Bursae: No trochanteric bursitis on either side. Muscles and tendons Muscles and tendons: The origins of the bilateral rectus femoris and common hamstring tendons are intact. The insertions of the bilateral gluteus minimus, gluteus medius, and iliopsoas tendons are intact. There is mild-to-moderate fluid tracking along the anterior aspect of the left iliopsoas junction, possibly within the left iliopsoas bursa (axial series 5, image 21, left hip coronal series 7, image 19. No definite enhancement in this region. This is nonspecific and may represent iliopsoas bursitis, however this edema does extend more superiorly, superficial to the anterior aspect of the left iliacus muscle (axial series 5 images 1 through 12, and in this region there is thin peripheral enhancement and possible multiple tiny pockets of rim enhancing fluid that could represent tiny abscesses (axial series 10 images 4 through 16). Recommend clinical correlation. There is also decreased T1 increased T2 signal likely fluid with multiple  thin internal septations and some internal loculated components, especially anteriorly (coronal series 3, images 7 through 14 and axial series 5 images 12 through 16) centered deep to the right pelvic sidewall musculature. No postcontrast images were performed including this region. Given the indication, it is difficult to exclude an abscess in this region. The dominant portion of this measures up to approximately 1.6 x 3.3 x 4.4 cm (transverse by AP by craniocaudal). This process also extends mildly more inferiorly and anteriorly, just deep to the inferior aspect of the right superior pubic ramus (axial series 5 images  16 through 19, coronal series 3 images 13 through 16). Other findings Miscellaneous: Mild free fluid within the pelvis, within normal limits for a premenopausal female. Normal physiologic follicles are seen within the bilateral ovaries. IMPRESSION: 1. There is mild-to-moderate fluid tracking along the anterior aspect of the left iliopsoas junction, possibly within the left iliopsoas bursa. No definite peripheral enhancement in this region. This is nonspecific and may represent iliopsoas bursitis, however this mild fluid/edema does extend more superiorly, superficial to the anterior aspect of the left iliacus muscle. There is thin peripheral enhancement and possible multiple tiny pockets of rim enhancing fluid that could represent tiny abscesses anterior to the left iliacus muscle. 2. Additional fluid signal with multiple thin internal septations and loculations centered in between the right acetabulum and the right pelvic sidewall musculature. Noting the indication for this study, MRI cannot determine whether this fluid is infected. 3. No bone cortical erosion or marrow edema to indicate evidence of acute osteomyelitis. Electronically Signed   By: Neita Garnetonald  Viola M.D.   On: 11/23/2022 11:58   MR FEMUR LEFT WO CONTRAST  Result Date: 11/22/2022 CLINICAL DATA:  Limping, acute thigh pain EXAM: MR OF  THE LEFT FEMUR WITHOUT CONTRAST TECHNIQUE: Multiplanar, multisequence MR imaging of the left was performed. No intravenous contrast was administered. COMPARISON:  None Available. FINDINGS: The exam is significantly limited, only coronal T2 and T1 sequences were obtained. Patient motion limits evaluation IMPRESSION: Significantly limited exam due to patient motion. The exam is only coronal T2 and T1 sequences were obtained. Nondiagnostic examination. Electronically Signed   By: Larose HiresImran  Ahmed D.O.   On: 11/22/2022 15:33   MR SHOULDER LEFT WO CONTRAST  Result Date: 11/22/2022 CLINICAL DATA:  Shoulder pain. Septic arthritis suspected. History of polysubstance abuse and intravenous drug use. Probable pulmonary septic emboli on chest CT. EXAM: MRI OF THE LEFT SHOULDER WITHOUT CONTRAST TECHNIQUE: Multiplanar, multisequence MR imaging of the shoulder was performed. No intravenous contrast was administered. COMPARISON:  Chest CT 11/18/2022.  CT left elbow 03/23/2020. FINDINGS: Rotator cuff:  Intact without significant tendinosis. Muscles: No focal muscular atrophy. There is a moderate amount of ill-defined fluid posteriorly between the deltoid, infraspinatus and teres minor muscles. Edema extends into the deltoid muscle. No well-defined focal fluid collections are identified. Biceps long head:  Intact and normally positioned. Acromioclavicular Joint: The acromion is type 2. No significant abnormality of the acromioclavicular joint. No generalized fluid in the subacromial-subdeltoid bursa. Glenohumeral Joint: No significant shoulder joint effusion or glenohumeral arthropathy. Labrum: Labral assessment limited by the lack of joint fluid. No evidence of labral tear or paralabral cyst. Bones: No evidence of acute fracture, dislocation or osteomyelitis. No erosive changes are identified. Other: As above, posterior intramuscular and intermuscular ill-defined fluid which could be infected. No focal fluid collection identified.  IMPRESSION: 1. Moderate ill-defined fluid posteriorly between the deltoid, infraspinatus and teres minor muscles which could be infected. No well-defined focal fluid collection identified. 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 2. No evidence of septic arthritis or osteomyelitis. 3. The rotator cuff, biceps tendon and labrum appear intact. Electronically Signed   By: Carey BullocksWilliam  Veazey M.D.   On: 11/22/2022 14:25     LOS: 6 days   Jeoffrey MassedShanker Lismary Kiehn, MD  [facility]    To contact the attending provider between 7A-7P or the covering provider during after hours 7P-7A, please log into the web site [email or web address] and access using universal [place] password for that web site. If you do not have the password, please call the  hospital operator.  11/24/2022, 9:26 AM

## 2022-11-24 NOTE — Progress Notes (Signed)
Dr. Myna Hidalgo was made aware that pt IV was infiltrated and that pt refused to IV restarted.

## 2022-11-25 ENCOUNTER — Other Ambulatory Visit (HOSPITAL_COMMUNITY): Payer: Self-pay

## 2022-11-25 LAB — BASIC METABOLIC PANEL
Anion gap: 10 (ref 5–15)
BUN: 7 mg/dL (ref 6–20)
CO2: 25 mmol/L (ref 22–32)
Calcium: 8.4 mg/dL — ABNORMAL LOW (ref 8.9–10.3)
Chloride: 103 mmol/L (ref 98–111)
Creatinine, Ser: 0.54 mg/dL (ref 0.44–1.00)
GFR, Estimated: >60 mL/min (ref >60)
Glucose, Bld: 120 mg/dL — ABNORMAL HIGH (ref 70–99)
Potassium: 4.3 mmol/L (ref 3.5–5.1)
Sodium: 138 mmol/L (ref 135–145)

## 2022-11-25 LAB — CBC
HCT: 35.7 % — ABNORMAL LOW (ref 36.0–46.0)
Hemoglobin: 11.8 g/dL — ABNORMAL LOW (ref 12.0–15.0)
MCH: 29.1 pg (ref 26.0–34.0)
MCHC: 33.1 g/dL (ref 30.0–36.0)
MCV: 87.9 fL (ref 80.0–100.0)
Platelets: 374 10*3/uL (ref 150–400)
RBC: 4.06 MIL/uL (ref 3.87–5.11)
RDW: 14.6 % (ref 11.5–15.5)
WBC: 13.9 10*3/uL — ABNORMAL HIGH (ref 4.0–10.5)
nRBC: 0 % (ref 0.0–0.2)

## 2022-11-25 LAB — CULTURE, BLOOD (ROUTINE X 2)
Special Requests: ADEQUATE
Special Requests: ADEQUATE

## 2022-11-25 LAB — ECHO TEE

## 2022-11-25 NOTE — Progress Notes (Signed)
PROGRESS NOTE        PATIENT DETAILS Name: Cassandra Roberts Age: 32 y.o. Sex: female Date of Birth: 06-30-1991 Admit Date: 11/18/2022 Admitting Physician Charlynne Cousins, MD VZD:GLOVFIE, No Pcp Per  Brief Summary: Patient is a 32 y.o.  female with history of IVDA-recent hospitalization at [facility] for multifocal pneumonia-left AMA-presented with chest tightness/shortness of breath-she was found to have sepsis due to multifocal pneumonia-and subsequently blood cultures positive for MRSA.  Significant events: 1/11>> admit to [facility]-sepsis-multifocal PNA-blood cultures subsequently positive for MRSA.  Significant studies: 1/11>> CT chest: Multifocal lung consolidation-greatest in the left side. 1/12>> TTE: EF 70-75%-no obvious vegetation 1/15>> MRI left shoulder: Moderate ill-defined fluid between the deltoid, infraspinatus and teres minor muscles.  No well-defined fluid collection identified. 1/15>> MRI left femur: Motion degraded exam-nondiagnostic exam. 1/16>> MRI left hip: Mild to moderate fluid tracking along the anterior aspect of the left iliopsoas junction-nonspecific-may represent bursitis.  Significant microbiology data: 1/11>> COVID/influenza/RSV PCR: Negative 1/11>> blood culture: MRSA 1/13>> blood culture: MRSA 1/14>> blood culture: MRSA 1/15>> blood culture: MRSA 1/16>> blood culture: Negative  Procedures: 1/17>> TEE EE: No vegetation.  Consults: ID Orthopedics  Subjective: No major issues overnight.  Long discussion with patient-explained that she needs to try and be compliant with IV antibiotics as much as possible.  Per nursing staff-she refused some doses of antibiotics yesterday-but they were able to be dosed last night.  Some pain in the right mid abdominal area.  Objective: Vitals: Blood pressure 112/68, pulse 77, temperature 99.1 F (37.3 C), temperature source Oral, resp. rate 18, height 5\' 2"  (1.575 m),  weight 56.7 kg, last menstrual period 11/04/2022, SpO2 97 %.   Exam: Gen Exam:Alert awake-not in any distress HEENT:atraumatic, normocephalic Chest: B/L clear to auscultation anteriorly CVS:S1S2 regular Abdomen:soft non tender, non distended Extremities:no edema Neurology: Non focal Skin: no rash  Pertinent Labs/Radiology:    Latest Ref Rng & Units 11/19/2022    3:16 AM 11/18/2022    8:28 AM 03/24/2020    4:37 AM  CBC  WBC 4.0 - 10.5 K/uL 15.6  15.6  20.3   Hemoglobin 12.0 - 15.0 g/dL 11.2  12.6  12.4   Hematocrit 36.0 - 46.0 % 31.8  36.4  35.8   Platelets 150 - 400 K/uL 194  216  425     Lab Results  Component Value Date   NA 134 (L) 11/24/2022   K 3.6 11/24/2022   CL 100 11/24/2022   CO2 25 11/24/2022      Assessment/Plan: Sepsis secondary to MRSA bacteremia/septic emboli Sepsis physiology resolved Blood cultures on 1/15 were negative till yesterday-now positive Blood cultures on 1/16 negative so far TEE negative No drainable fluid collection seen on MRI studies of left hip/left shoulder Continue as needed narcotics for pain issues. Clearly not a candidate for outpatient IV antibiotics-will need to stay inpatient to complete IV antibiotic therapy. Per nursing staff-patient has refused some antibiotics yesterday-I had a long conversation with patient-explained importance of taking the antibiotics as prescribed-and potential for metastatic MRSA infection-due to noncompliance.  History of polysubstance abuse/IVDU (UDS positive for cocaine/amphetamines) Counseled  Chronic HCV infection Outpatient management  BMI: Estimated body mass index is 22.86 kg/m as calculated from the following:   Height as of this encounter: 5\' 2"  (1.575 m).   Weight as of this encounter: 56.7 kg.  Code status:   Code Status: Full Code   DVT Prophylaxis: enoxaparin (LOVENOX) injection 40 mg Start: 11/18/22 1930   Family Communication: None at bedside   Disposition Plan: Status is:  Inpatient Remains inpatient appropriate because: Severity of illness.   Planned Discharge Destination:Home   Diet: Diet Order             Diet regular Room service appropriate? Yes; Fluid consistency: Thin  Diet effective now                     Antimicrobial agents: Anti-infectives (From admission, onward)    Start     Dose/Rate Route Frequency Ordered Stop   11/23/22 1400  DAPTOmycin (CUBICIN) 500 mg in sodium chloride 0.9 % IVPB        500 mg 120 mL/hr over 30 Minutes Intravenous Daily 11/23/22 0921     11/23/22 1000  ceftaroline (TEFLARO) 600 mg in sodium chloride 0.9 % 100 mL IVPB        600 mg 100 mL/hr over 60 Minutes Intravenous Every 8 hours 11/23/22 0921     11/22/22 1445  vancomycin (VANCOCIN) IVPB 1000 mg/200 mL premix  Status:  Discontinued        1,000 mg 200 mL/hr over 60 Minutes Intravenous Every 8 hours 11/22/22 1347 11/23/22 0921   11/19/22 0200  vancomycin (VANCOCIN) IVPB 750 mg/150 ml premix  Status:  Discontinued        750 mg 150 mL/hr over 60 Minutes Intravenous Every 12 hours 11/18/22 1354 11/19/22 0110   11/19/22 0200  vancomycin (VANCOREADY) IVPB 750 mg/150 mL  Status:  Discontinued        750 mg 150 mL/hr over 60 Minutes Intravenous Every 12 hours 11/19/22 0110 11/22/22 1347   11/19/22 0000  linezolid (ZYVOX) 600 MG tablet        600 mg Oral 2 times daily 11/19/22 1045 12/31/22 2359   11/18/22 1330  ceFEPIme (MAXIPIME) 2 g in sodium chloride 0.9 % 100 mL IVPB        2 g 200 mL/hr over 30 Minutes Intravenous  Once 11/18/22 1324 11/18/22 1408   11/18/22 1330  Vancomycin (VANCOCIN) 1,250 mg in sodium chloride 0.9 % 250 mL IVPB        1,250 mg 166.7 mL/hr over 90 Minutes Intravenous  Once 11/18/22 1329 11/18/22 1652        MEDICATIONS: Scheduled Meds:  enoxaparin (LOVENOX) injection  40 mg Subcutaneous Q24H   metoprolol tartrate  25 mg Oral BID   morphine  30 mg Oral Q12H   nicotine  14 mg Transdermal Daily   Continuous Infusions:   ceFTAROline (TEFLARO) IV 600 mg (11/25/22 0826)   DAPTOmycin (CUBICIN) 500 mg in sodium chloride 0.9 % IVPB 500 mg (11/24/22 2254)   PRN Meds:.acetaminophen **OR** acetaminophen, metoprolol tartrate, ondansetron **OR** ondansetron (ZOFRAN) IV, polyethylene glycol   I have personally reviewed following labs and imaging studies  LABORATORY DATA: CBC: Recent Labs  Lab 11/19/22 0316  WBC 15.6*  HGB 11.2*  HCT 31.8*  MCV 85.9  PLT 194     Basic Metabolic Panel: Recent Labs  Lab 11/19/22 0316 11/20/22 0220 11/23/22 0809 11/24/22 1020  NA 134* 134* 135 134*  K 3.1* 4.3 4.3 3.6  CL 101 102 102 100  CO2 24 23 24 25   GLUCOSE 149* 99 101* 102*  BUN 7 9 6 6   CREATININE 0.67 0.60 0.50 0.57  CALCIUM 8.0* 8.2* 7.7* 8.0*  GFR: Estimated Creatinine Clearance: 80.6 mL/min (by C-G formula based on SCr of 0.57 mg/dL).  Liver Function Tests: Recent Labs  Lab 11/19/22 0316  AST 19  ALT 42  ALKPHOS 162*  BILITOT 0.9  PROT 5.6*  ALBUMIN 2.3*    No results for input(s): "LIPASE", "AMYLASE" in the last 168 hours. No results for input(s): "AMMONIA" in the last 168 hours.  Coagulation Profile: Recent Labs  Lab 11/24/22 1020  INR 1.1    Cardiac Enzymes: Recent Labs  Lab 11/23/22 1303  CKTOTAL 16*     BNP (last 3 results) No results for input(s): "PROBNP" in the last 8760 hours.  Lipid Profile: No results for input(s): "CHOL", "HDL", "LDLCALC", "TRIG", "CHOLHDL", "LDLDIRECT" in the last 72 hours.  Thyroid Function Tests: No results for input(s): "TSH", "T4TOTAL", "FREET4", "T3FREE", "THYROIDAB" in the last 72 hours.  Anemia Panel: No results for input(s): "VITAMINB12", "FOLATE", "FERRITIN", "TIBC", "IRON", "RETICCTPCT" in the last 72 hours.  Urine analysis:    Component Value Date/Time   COLORURINE YELLOW 11/18/2022 1020   APPEARANCEUR CLOUDY (A) 11/18/2022 1020   LABSPEC 1.009 11/18/2022 1020   PHURINE 6.0 11/18/2022 1020   GLUCOSEU NEGATIVE 11/18/2022  1020   HGBUR SMALL (A) 11/18/2022 1020   BILIRUBINUR NEGATIVE 11/18/2022 1020   KETONESUR NEGATIVE 11/18/2022 1020   PROTEINUR NEGATIVE 11/18/2022 1020   UROBILINOGEN 0.2 07/02/2015 1044   NITRITE POSITIVE (A) 11/18/2022 1020   LEUKOCYTESUR MODERATE (A) 11/18/2022 1020    Sepsis Labs: Lactic Acid, Venous    Component Value Date/Time   LATICACIDVEN 1.4 11/18/2022 1303    MICROBIOLOGY: Recent Results (from the past 240 hour(s))  Resp panel by RT-PCR (RSV, Flu A&B, Covid) Anterior Nasal Swab     Status: None   Collection Time: 11/18/22 10:08 AM   Specimen: Anterior Nasal Swab  Result Value Ref Range Status   SARS Coronavirus 2 by RT PCR NEGATIVE NEGATIVE Final    Comment: (NOTE) SARS-CoV-2 target nucleic acids are NOT DETECTED.  The SARS-CoV-2 RNA is generally detectable in upper respiratory specimens during the acute phase of infection. The lowest concentration of SARS-CoV-2 viral copies this assay can detect is 138 copies/mL. A negative result does not preclude SARS-Cov-2 infection and should not be used as the sole basis for treatment or other patient management decisions. A negative result may occur with  improper specimen collection/handling, submission of specimen other than nasopharyngeal swab, presence of viral mutation(s) within the areas targeted by this assay, and inadequate number of viral copies(<138 copies/mL). A negative result must be combined with clinical observations, patient history, and epidemiological information. The expected result is Negative.  Fact Sheet for Patients:  BloggerCourse.com  Fact Sheet for Healthcare Providers:  SeriousBroker.it  This test is no t yet approved or cleared by the [place] FDA and  has been authorized for detection and/or diagnosis of SARS-CoV-2 by FDA under an Emergency Use Authorization (EUA). This EUA will remain  in effect (meaning this test can be used) for the  duration of the COVID-19 declaration under Section 564(b)(1) of the Act, 21 U.S.C.section 360bbb-3(b)(1), unless the authorization is terminated  or revoked sooner.       Influenza A by PCR NEGATIVE NEGATIVE Final   Influenza B by PCR NEGATIVE NEGATIVE Final    Comment: (NOTE) The Xpert Xpress SARS-CoV-2/FLU/RSV plus assay is intended as an aid in the diagnosis of influenza from Nasopharyngeal swab specimens and should not be used as a sole basis for treatment. Nasal washings and  aspirates are unacceptable for Xpert Xpress SARS-CoV-2/FLU/RSV testing.  Fact Sheet for Patients: BloggerCourse.com  Fact Sheet for Healthcare Providers: SeriousBroker.it  This test is not yet approved or cleared by the [place] FDA and has been authorized for detection and/or diagnosis of SARS-CoV-2 by FDA under an Emergency Use Authorization (EUA). This EUA will remain in effect (meaning this test can be used) for the duration of the COVID-19 declaration under Section 564(b)(1) of the Act, 21 U.S.C. section 360bbb-3(b)(1), unless the authorization is terminated or revoked.     Resp Syncytial Virus by PCR NEGATIVE NEGATIVE Final    Comment: (NOTE) Fact Sheet for Patients: BloggerCourse.com  Fact Sheet for Healthcare Providers: SeriousBroker.it  This test is not yet approved or cleared by the [place] FDA and has been authorized for detection and/or diagnosis of SARS-CoV-2 by FDA under an Emergency Use Authorization (EUA). This EUA will remain in effect (meaning this test can be used) for the duration of the COVID-19 declaration under Section 564(b)(1) of the Act, 21 U.S.C. section 360bbb-3(b)(1), unless the authorization is terminated or revoked.  Performed at [facility], [address], [place], [place] [address]   Blood culture (routine x 2)     Status: Abnormal    Collection Time: 11/18/22 12:07 PM   Specimen: BLOOD  Result Value Ref Range Status   Specimen Description BLOOD SITE NOT SPECIFIED  Final   Special Requests   Final    BOTTLES DRAWN AEROBIC AND ANAEROBIC Blood Culture adequate volume   Culture  Setup Time   Final    GRAM POSITIVE COCCI IN BOTH AEROBIC AND ANAEROBIC BOTTLES CRITICAL RESULT CALLED TO, READ BACK BY AND VERIFIED WITH: PHARMD G. ABBOTT 11/19/22 @ 338 BY AB Performed at [facility], [address], [place], [place] [address]    Culture METHICILLIN RESISTANT STAPHYLOCOCCUS AUREUS (A)  Final   Report Status 11/21/2022 FINAL  Final   Organism ID, Bacteria METHICILLIN RESISTANT STAPHYLOCOCCUS AUREUS  Final      Susceptibility   Methicillin resistant staphylococcus aureus - MIC*    CIPROFLOXACIN >=8 RESISTANT Resistant     ERYTHROMYCIN >=8 RESISTANT Resistant     GENTAMICIN <=0.5 SENSITIVE Sensitive     OXACILLIN >=4 RESISTANT Resistant     TETRACYCLINE <=1 SENSITIVE Sensitive     VANCOMYCIN <=0.5 SENSITIVE Sensitive     TRIMETH/SULFA <=10 SENSITIVE Sensitive     CLINDAMYCIN >=8 RESISTANT Resistant     RIFAMPIN <=0.5 SENSITIVE Sensitive     Inducible Clindamycin NEGATIVE Sensitive     * METHICILLIN RESISTANT STAPHYLOCOCCUS AUREUS  Blood Culture ID Panel (Reflexed)     Status: Abnormal   Collection Time: 11/18/22 12:07 PM  Result Value Ref Range Status   Enterococcus faecalis NOT DETECTED NOT DETECTED Final   Enterococcus Faecium NOT DETECTED NOT DETECTED Final   Listeria monocytogenes NOT DETECTED NOT DETECTED Final   Staphylococcus species DETECTED (A) NOT DETECTED Final    Comment: CRITICAL RESULT CALLED TO, READ BACK BY AND VERIFIED WITH: PHARMD G. ABBOTT 11/19/22 @ 338 BY AB    Staphylococcus aureus (BCID) DETECTED (A) NOT DETECTED Final    Comment: Methicillin (oxacillin)-resistant Staphylococcus aureus (MRSA). MRSA is predictably resistant to beta-lactam antibiotics (except ceftaroline). Preferred therapy is  vancomycin unless clinically contraindicated. Patient requires contact precautions if  hospitalized. CRITICAL RESULT CALLED TO, READ BACK BY AND VERIFIED WITH: PHARMD G. ABBOTT 11/19/22 @ 338 BY AB    Staphylococcus epidermidis NOT DETECTED NOT DETECTED Final  Staphylococcus lugdunensis NOT DETECTED NOT DETECTED Final   Streptococcus species NOT DETECTED NOT DETECTED Final   Streptococcus agalactiae NOT DETECTED NOT DETECTED Final   Streptococcus pneumoniae NOT DETECTED NOT DETECTED Final   Streptococcus pyogenes NOT DETECTED NOT DETECTED Final   A.calcoaceticus-baumannii NOT DETECTED NOT DETECTED Final   Bacteroides fragilis NOT DETECTED NOT DETECTED Final   Enterobacterales NOT DETECTED NOT DETECTED Final   Enterobacter cloacae complex NOT DETECTED NOT DETECTED Final   Escherichia coli NOT DETECTED NOT DETECTED Final   Klebsiella aerogenes NOT DETECTED NOT DETECTED Final   Klebsiella oxytoca NOT DETECTED NOT DETECTED Final   Klebsiella pneumoniae NOT DETECTED NOT DETECTED Final   Proteus species NOT DETECTED NOT DETECTED Final   Salmonella species NOT DETECTED NOT DETECTED Final   Serratia marcescens NOT DETECTED NOT DETECTED Final   Haemophilus influenzae NOT DETECTED NOT DETECTED Final   Neisseria meningitidis NOT DETECTED NOT DETECTED Final   Pseudomonas aeruginosa NOT DETECTED NOT DETECTED Final   Stenotrophomonas maltophilia NOT DETECTED NOT DETECTED Final   Candida albicans NOT DETECTED NOT DETECTED Final   Candida auris NOT DETECTED NOT DETECTED Final   Candida glabrata NOT DETECTED NOT DETECTED Final   Candida krusei NOT DETECTED NOT DETECTED Final   Candida parapsilosis NOT DETECTED NOT DETECTED Final   Candida tropicalis NOT DETECTED NOT DETECTED Final   Cryptococcus neoformans/gattii NOT DETECTED NOT DETECTED Final   Meth resistant mecA/C and MREJ DETECTED (A) NOT DETECTED Final    Comment: CRITICAL RESULT CALLED TO, READ BACK BY AND VERIFIED WITH: PHARMD G. ABBOTT  11/19/22 @ 338 BY AB Performed at [facility], [address] [place], [place] [address]   Blood culture (routine x 2)     Status: None   Collection Time: 11/19/22  3:16 AM   Specimen: BLOOD  Result Value Ref Range Status   Specimen Description BLOOD BLOOD LEFT HAND  Final   Special Requests   Final    BOTTLES DRAWN AEROBIC AND ANAEROBIC Blood Culture adequate volume   Culture   Final    NO GROWTH 5 DAYS Performed at [facility], [place] [address] [place], [place] [address]    Report Status 11/24/2022 FINAL  Final  Culture, blood (Routine X 2) w Reflex to ID Panel     Status: Abnormal   Collection Time: 11/20/22  2:20 AM   Specimen: BLOOD LEFT HAND  Result Value Ref Range Status   Specimen Description BLOOD LEFT HAND  Final   Special Requests IN PEDIATRIC BOTTLE Blood Culture adequate volume  Final   Culture  Setup Time   Final    AEROBIC BOTTLE ONLY GRAM POSITIVE COCCI IN CLUSTERS CRITICAL VALUE NOTED.  VALUE IS CONSISTENT WITH PREVIOUSLY REPORTED AND CALLED VALUE.    Culture (A)  Final    STAPHYLOCOCCUS AUREUS SUSCEPTIBILITIES PERFORMED ON PREVIOUS CULTURE WITHIN THE LAST 5 DAYS. Performed at [facility], [place] [address] [place], [place] [address]    Report Status 11/21/2022 FINAL  Final  Culture, blood (Routine X 2) w Reflex to ID Panel     Status: Abnormal   Collection Time: 11/20/22  6:09 AM   Specimen: BLOOD LEFT HAND  Result Value Ref Range Status   Specimen Description BLOOD LEFT HAND  Final   Special Requests   Final    BOTTLES DRAWN AEROBIC ONLY Blood Culture results may not be optimal due to an inadequate volume of blood received in culture bottles   Culture  Setup Time   Final    AEROBIC BOTTLE ONLY GRAM POSITIVE COCCI IN CLUSTERS CRITICAL VALUE NOTED.  VALUE IS CONSISTENT WITH PREVIOUSLY REPORTED AND CALLED VALUE.    Culture (A)  Final    STAPHYLOCOCCUS AUREUS SUSCEPTIBILITIES PERFORMED ON PREVIOUS CULTURE WITHIN THE LAST 5  DAYS. Performed at [facility], [address] [place], [place] [address]    Report Status 11/21/2022 FINAL  Final  Culture, blood (Routine X 2) w Reflex to ID Panel     Status: Abnormal   Collection Time: 11/21/22 10:04 AM   Specimen: BLOOD LEFT HAND  Result Value Ref Range Status   Specimen Description BLOOD LEFT HAND  Final   Special Requests   Final    BOTTLES DRAWN AEROBIC AND ANAEROBIC Blood Culture adequate volume   Culture  Setup Time   Final    GRAM POSITIVE COCCI IN BOTH AEROBIC AND ANAEROBIC BOTTLES CRITICAL VALUE NOTED.  VALUE IS CONSISTENT WITH PREVIOUSLY REPORTED AND CALLED VALUE.    Culture (A)  Final    STAPHYLOCOCCUS AUREUS SUSCEPTIBILITIES PERFORMED ON PREVIOUS CULTURE WITHIN THE LAST 5 DAYS. Performed at [facility], [address] [place], [place] [address]    Report Status 11/24/2022 FINAL  Final  Culture, blood (Routine X 2) w Reflex to ID Panel     Status: Abnormal   Collection Time: 11/21/22 10:14 AM   Specimen: BLOOD  Result Value Ref Range Status   Specimen Description BLOOD RIGHT ANTECUBITAL  Final   Special Requests   Final    BOTTLES DRAWN AEROBIC ONLY Blood Culture adequate volume   Culture  Setup Time   Final    GRAM POSITIVE COCCI AEROBIC BOTTLE ONLY Gram Stain Report Called to,Read Back By and Verified With: PHARMD G. ABBOTT 11/21/22 @2321  BY AB    Culture (A)  Final    STAPHYLOCOCCUS AUREUS SUSCEPTIBILITIES PERFORMED ON PREVIOUS CULTURE WITHIN THE LAST 5 DAYS. Performed at [facility], [address] [place], [place] [address]    Report Status 11/24/2022 FINAL  Final  Culture, blood (Routine X 2) w Reflex to ID Panel     Status: Abnormal   Collection Time: 11/22/22 12:03 PM   Specimen: BLOOD LEFT HAND  Result Value Ref Range Status   Specimen Description BLOOD LEFT HAND  Final   Special Requests   Final    BOTTLES DRAWN AEROBIC AND ANAEROBIC Blood Culture adequate volume   Culture  Setup Time   Final    GRAM  POSITIVE COCCI IN CLUSTERS ANAEROBIC BOTTLE ONLY CRITICAL VALUE NOTED.  VALUE IS CONSISTENT WITH PREVIOUSLY REPORTED AND CALLED VALUE.    Culture (A)  Final    STAPHYLOCOCCUS AUREUS SUSCEPTIBILITIES PERFORMED ON PREVIOUS CULTURE WITHIN THE LAST 5 DAYS. Performed at [facility], [address] [place], [place] [address]    Report Status 11/25/2022 FINAL  Final  Culture, blood (Routine X 2) w Reflex to ID Panel     Status: Abnormal   Collection Time: 11/22/22 12:24 PM   Specimen: BLOOD RIGHT HAND  Result Value Ref Range Status   Specimen Description BLOOD RIGHT HAND  Final   Special Requests   Final    BOTTLES DRAWN AEROBIC AND ANAEROBIC Blood Culture adequate volume   Culture  Setup Time   Final    GRAM POSITIVE COCCI IN CLUSTERS IN BOTH AEROBIC AND ANAEROBIC BOTTLES Gram Stain Report Called to,Read Back By and Verified With: [facility] LEDFORD 11/23/22 @ [id number]  BY AB    Culture (A)  Final    STAPHYLOCOCCUS AUREUS SUSCEPTIBILITIES PERFORMED ON PREVIOUS CULTURE WITHIN THE LAST 5 DAYS. Performed at [facility], [address] [place], [place] [address]    Report Status 11/25/2022 FINAL  Final  Culture, blood (Routine X 2) w Reflex to ID Panel     Status: None (Preliminary result)   Collection Time: 11/23/22  1:03 PM   Specimen: BLOOD LEFT ARM  Result Value Ref Range Status   Specimen Description BLOOD LEFT ARM  Final   Special Requests   Final    BOTTLES DRAWN AEROBIC ONLY Blood Culture adequate volume   Culture   Final    NO GROWTH 1 DAY Performed at [facility], [address] [place], [place] [address]    Report Status PENDING  Incomplete  Culture, blood (Routine X 2) w Reflex to ID Panel     Status: None (Preliminary result)   Collection Time: 11/23/22  1:04 PM   Specimen: BLOOD LEFT ARM  Result Value Ref Range Status   Specimen Description BLOOD LEFT ARM  Final   Special Requests   Final    BOTTLES DRAWN AEROBIC ONLY Blood Culture adequate volume    Culture   Final    NO GROWTH 1 DAY Performed at [facility], [address] [place], [place] [address]    Report Status PENDING  Incomplete    RADIOLOGY STUDIES/RESULTS: MR HIP LEFT W WO CONTRAST  Result Date: 11/23/2022 CLINICAL DATA:  Septic arthritis suspected. EXAM: MRI OF THE LEFT HIP WITHOUT AND WITH CONTRAST TECHNIQUE: Multiplanar, multisequence MR imaging was performed both before and after administration of intravenous contrast. CONTRAST:  5mL GADAVIST GADOBUTROL 1 MMOL/ML IV SOLN COMPARISON:  Limited MRI bilateral femurs (patient was unable to complete exam) 11/22/2022 FINDINGS: Bones: Mild pubic symphysis joint space narrowing and peripheral osteophytosis. No acute fracture or avascular necrosis is seen within the visualized portions of the pelvis or either proximal femur. There is decreased T1 signal seen diffusely throughout the ilium, ischium, and pubis of the pelvis. This remains hyperintense to skeletal muscle. This is nonspecific but compatible with red marrow reconversion as can be seen in the settings of obesity, anemia, and/or smoking along with other etiologies. No cortical erosion is seen. Articular cartilage and labrum Articular cartilage: Mild thinning of the superior left acetabular and femoral head cartilage. Labrum: Lack of intra-articular fluid limits evaluation of the left acetabular labrum. No dedicated left hip axial fluid sensitive images were performed. No definitive labral tear is seen on the provided images. Joint or bursal effusion Joint effusion:  No joint effusion within either hip. Bursae: No trochanteric bursitis on either side. Muscles and tendons Muscles and tendons: The origins of the bilateral rectus femoris and common hamstring tendons are intact. The insertions of the bilateral gluteus minimus, gluteus medius, and iliopsoas tendons are intact. There is mild-to-moderate fluid tracking along the anterior aspect of the left iliopsoas junction, possibly  within the left iliopsoas bursa (axial series 5, image 21, left hip coronal series 7, image 19. No definite enhancement in this region. This is nonspecific and may represent iliopsoas bursitis, however this edema does extend more superiorly, superficial to the anterior aspect of the left iliacus muscle (axial series 5 images 1 through 12, and in this region there is thin peripheral enhancement and possible multiple tiny pockets of rim enhancing fluid that could represent tiny abscesses (axial series 10 images 4 through 16).  Recommend clinical correlation. There is also decreased T1 increased T2 signal likely fluid with multiple thin internal septations and some internal loculated components, especially anteriorly (coronal series 3, images 7 through 14 and axial series 5 images 12 through 16) centered deep to the right pelvic sidewall musculature. No postcontrast images were performed including this region. Given the indication, it is difficult to exclude an abscess in this region. The dominant portion of this measures up to approximately 1.6 x 3.3 x 4.4 cm (transverse by AP by craniocaudal). This process also extends mildly more inferiorly and anteriorly, just deep to the inferior aspect of the right superior pubic ramus (axial series 5 images 16 through 19, coronal series 3 images 13 through 16). Other findings Miscellaneous: Mild free fluid within the pelvis, within normal limits for a premenopausal female. Normal physiologic follicles are seen within the bilateral ovaries. IMPRESSION: 1. There is mild-to-moderate fluid tracking along the anterior aspect of the left iliopsoas junction, possibly within the left iliopsoas bursa. No definite peripheral enhancement in this region. This is nonspecific and may represent iliopsoas bursitis, however this mild fluid/edema does extend more superiorly, superficial to the anterior aspect of the left iliacus muscle. There is thin peripheral enhancement and possible multiple  tiny pockets of rim enhancing fluid that could represent tiny abscesses anterior to the left iliacus muscle. 2. Additional fluid signal with multiple thin internal septations and loculations centered in between the right acetabulum and the right pelvic sidewall musculature. Noting the indication for this study, MRI cannot determine whether this fluid is infected. 3. No bone cortical erosion or marrow edema to indicate evidence of acute osteomyelitis. Electronically Signed   By: Neita Garnet M.D.   On: 11/23/2022 11:58     LOS: 7 days   Jeoffrey Massed, MD  [facility]    To contact the attending provider between 7A-7P or the covering provider during after hours 7P-7A, please log into the web site [email or web address] and access using universal [place] password for that web site. If you do not have the password, please call the hospital operator.  11/25/2022, 9:40 AM

## 2022-11-26 ENCOUNTER — Other Ambulatory Visit (HOSPITAL_COMMUNITY): Payer: Self-pay

## 2022-11-26 MED ORDER — IPRATROPIUM-ALBUTEROL 0.5-2.5 (3) MG/3ML IN SOLN
3.0000 mL | Freq: Four times a day (QID) | RESPIRATORY_TRACT | Status: DC | PRN
  Filled 2022-11-26: qty 3

## 2022-11-26 NOTE — TOC Progression Note (Signed)
Transition of Care [facility]) - Progression Note    Patient Details  Name: Cassandra Roberts MRN: [id number] Date of Birth: 1991-07-12  [facility]) CM/SW Contact  Levonne Lapping, RN Phone Number: 11/26/2022, 2:01 PM  Clinical Narrative:     Patient will remain here until IV ABX are completed  Must have a negative culture. Patient has a hx of IVDU and is uninsured.  Patient has no PCP and a request has been made to establish patient at the [facility] in [facility]         Barriers to Discharge: Inadequate or no insurance  Expected Discharge Plan and [place] arrangements for the past 2 months: Single Family Home                                       Social Determinants of Health (SDOH) Interventions SDOH Screenings   Food Insecurity: No Food Insecurity (11/18/2022)  Housing: Low Risk  (11/18/2022)  Transportation Needs: No Transportation Needs (11/18/2022)  Utilities: Not At Risk (11/18/2022)  Tobacco Use: High Risk (11/24/2022)    Readmission Risk Interventions     No data to display

## 2022-11-26 NOTE — Progress Notes (Signed)
[facility] Infectious Diseases Follow Up Note  Patient Identification: Patient Name: Cassandra Roberts MRN: [id number] Admit Date: 11/18/2022  8:00 AM Age: 32 y.o.Today's Date: 11/26/2022  Reason for Visit: MRSA bacteremia   Principal Problem:   MRSA bacteremia Active Problems:   Polysubstance abuse (HCC)   IVDU (intravenous drug user)   CAP (community acquired pneumonia)   Septic embolism (HCC)   Sepsis (HCC)   Sinus tachycardia   Cellulitis of left foot   Multifocal pneumonia   Staphylococcal arthritis of left shoulder (HCC)   Chronic hepatitis C with hepatic coma (HCC)   Pyomyositis   Antibiotics: Vancomycin 1/11- 1/15, Daptomycin/ceftaroline combo 1/16-c Total days of abtx d 7   Lines/Hardwares: No known   Interval Events: afebrile, leukocytosis is downtrending  Assessment 32 year old female with history of IVDU and polysubstance abuse was initially seen at [facility] on 12/23 for multifocal pneumonia and presented to [facility] ED for worsening cough and left lower extremity cellulitis.  Admitted with  # MRSA bacteremia with possible pulmonary septic emboli with at least with cavity - no spinal tenderness at CTL spine  - 1/11 Blood cx  MRSA - 1/12 blood cx NG -1/13 blood cx 2/2 sets  MRSA - 1/14 blood cx 2/2 sets MRSA - 1/15 blood cx 2/2 sets MRSA - 1/16 blood cx 2/2 sets NG in 3 days  - 1/17 TEE negative for vegetations or endocarditis. Bubble Contrast Study: Positive, mild bubble crossover, small PFO    # Moderate ill-defined fluid posteriorly between the deltoid, infraspinatus and teres minor muscles/Possible posterior deltoid myositis Left shoulder - no symptoms at left shoulder whatsoever today  # Fluid tracking along the left iliopsoas bursa, possible iliopsoas bursitis with associated multiple tiny abscesses  as well as multiple thin internal septations and loculations in between the rt acetabulum and rt pelvic  wall.  Evaluated by orthopedics, no intervention recommended. Recommended to continue IV abtx. - no symptoms at left hip/thigh today   # Hepatitis C - management OP  Recommendations Continue Daptomycin and ceftaroline as is until blood cx 1/16 is negative ( Final). Most likely the persistent bacteremia was due to under dosing of Vancomycin and will switch to Vancomycin monotherapy once blood cultures clears  Will plan to re-image left hip sometime next week.  Monitor CPK, CBC and BMP Will follow peripherally over the weekend, please call with questions D/w ID Pharm D  Rest of the management as per the primary team. Thank you for the consult. Please page with pertinent questions or concerns.  ______________________________________________________________________ Subjective Patient seen and examined at bedside. No complaints including her left hip and left shoulder. She wants to sleep   Vitals BP 119/76 (BP Location: Right Arm)   Pulse 76   Temp 98.6 F (37 C) (Oral)   Resp 16   Ht 5\' 2"  (1.575 m)   Wt 56.7 kg   LMP 11/04/2022   SpO2 100%   BMI 22.86 kg/m   Exam Young female lying in the bed and appears comfortable  HEENT - wnl, mucosa moist Heart and lung sounds WNL Skin - no rashes, tattoos+ MSK - no pedal edema, Left shoulder with no signs of septic joint, full ROM. Left hip with full ROM. No signs of septic joints in other peripheral joints.   Pertinent Microbiology Results for orders placed or performed during the hospital encounter of 11/18/22  Resp panel by RT-PCR (RSV, Flu A&B, Covid) Anterior Nasal Swab     Status: None   Collection  Time: 11/18/22 10:08 AM   Specimen: Anterior Nasal Swab  Result Value Ref Range Status   SARS Coronavirus 2 by RT PCR NEGATIVE NEGATIVE Final    Comment: (NOTE) SARS-CoV-2 target nucleic acids are NOT DETECTED.  The SARS-CoV-2 RNA is generally detectable in upper respiratory specimens during the acute phase of infection. The  lowest concentration of SARS-CoV-2 viral copies this assay can detect is 138 copies/mL. A negative result does not preclude SARS-Cov-2 infection and should not be used as the sole basis for treatment or other patient management decisions. A negative result may occur with  improper specimen collection/handling, submission of specimen other than nasopharyngeal swab, presence of viral mutation(s) within the areas targeted by this assay, and inadequate number of viral copies(<138 copies/mL). A negative result must be combined with clinical observations, patient history, and epidemiological information. The expected result is Negative.  Fact Sheet for Patients:  BloggerCourse.comhttps://www.fda.gov/media/152166/download  Fact Sheet for Healthcare Providers:  SeriousBroker.ithttps://www.fda.gov/media/152162/download  This test is no t yet approved or cleared by the [place] FDA and  has been authorized for detection and/or diagnosis of SARS-CoV-2 by FDA under an Emergency Use Authorization (EUA). This EUA will remain  in effect (meaning this test can be used) for the duration of the COVID-19 declaration under Section 564(b)(1) of the Act, 21 U.S.C.section 360bbb-3(b)(1), unless the authorization is terminated  or revoked sooner.       Influenza A by PCR NEGATIVE NEGATIVE Final   Influenza B by PCR NEGATIVE NEGATIVE Final    Comment: (NOTE) The Xpert Xpress SARS-CoV-2/FLU/RSV plus assay is intended as an aid in the diagnosis of influenza from Nasopharyngeal swab specimens and should not be used as a sole basis for treatment. Nasal washings and aspirates are unacceptable for Xpert Xpress SARS-CoV-2/FLU/RSV testing.  Fact Sheet for Patients: BloggerCourse.comhttps://www.fda.gov/media/152166/download  Fact Sheet for Healthcare Providers: SeriousBroker.ithttps://www.fda.gov/media/152162/download  This test is not yet approved or cleared by the [place] FDA and has been authorized for detection and/or diagnosis of SARS-CoV-2 by FDA under  an Emergency Use Authorization (EUA). This EUA will remain in effect (meaning this test can be used) for the duration of the COVID-19 declaration under Section 564(b)(1) of the Act, 21 U.S.C. section 360bbb-3(b)(1), unless the authorization is terminated or revoked.     Resp Syncytial Virus by PCR NEGATIVE NEGATIVE Final    Comment: (NOTE) Fact Sheet for Patients: BloggerCourse.comhttps://www.fda.gov/media/152166/download  Fact Sheet for Healthcare Providers: SeriousBroker.ithttps://www.fda.gov/media/152162/download  This test is not yet approved or cleared by the [place] FDA and has been authorized for detection and/or diagnosis of SARS-CoV-2 by FDA under an Emergency Use Authorization (EUA). This EUA will remain in effect (meaning this test can be used) for the duration of the COVID-19 declaration under Section 564(b)(1) of the Act, 21 U.S.C. section 360bbb-3(b)(1), unless the authorization is terminated or revoked.  Performed at [facility], [address] [place], [place] [id number]   Blood culture (routine x 2)     Status: Abnormal   Collection Time: 11/18/22 12:07 PM   Specimen: BLOOD  Result Value Ref Range Status   Specimen Description BLOOD SITE NOT SPECIFIED  Final   Special Requests   Final    BOTTLES DRAWN AEROBIC AND ANAEROBIC Blood Culture adequate volume   Culture  Setup Time   Final    GRAM POSITIVE COCCI IN BOTH AEROBIC AND ANAEROBIC BOTTLES CRITICAL RESULT CALLED TO, READ BACK BY AND VERIFIED WITH: PHARMD G. ABBOTT 11/19/22 @ 338 BY AB Performed at [facility], [address], [place], [place] [address]    Culture METHICILLIN RESISTANT STAPHYLOCOCCUS AUREUS (A)  Final   Report Status 11/21/2022 FINAL  Final   Organism ID, Bacteria METHICILLIN RESISTANT STAPHYLOCOCCUS AUREUS  Final      Susceptibility   Methicillin resistant staphylococcus aureus - MIC*    CIPROFLOXACIN >=8 RESISTANT Resistant     ERYTHROMYCIN >=8 RESISTANT Resistant     GENTAMICIN <=0.5 SENSITIVE  Sensitive     OXACILLIN >=4 RESISTANT Resistant     TETRACYCLINE <=1 SENSITIVE Sensitive     VANCOMYCIN <=0.5 SENSITIVE Sensitive     TRIMETH/SULFA <=10 SENSITIVE Sensitive     CLINDAMYCIN >=8 RESISTANT Resistant     RIFAMPIN <=0.5 SENSITIVE Sensitive     Inducible Clindamycin NEGATIVE Sensitive     * METHICILLIN RESISTANT STAPHYLOCOCCUS AUREUS  Blood Culture ID Panel (Reflexed)     Status: Abnormal   Collection Time: 11/18/22 12:07 PM  Result Value Ref Range Status   Enterococcus faecalis NOT DETECTED NOT DETECTED Final   Enterococcus Faecium NOT DETECTED NOT DETECTED Final   Listeria monocytogenes NOT DETECTED NOT DETECTED Final   Staphylococcus species DETECTED (A) NOT DETECTED Final    Comment: CRITICAL RESULT CALLED TO, READ BACK BY AND VERIFIED WITH: PHARMD G. ABBOTT 11/19/22 @ 338 BY AB    Staphylococcus aureus (BCID) DETECTED (A) NOT DETECTED Final    Comment: Methicillin (oxacillin)-resistant Staphylococcus aureus (MRSA). MRSA is predictably resistant to beta-lactam antibiotics (except ceftaroline). Preferred therapy is vancomycin unless clinically contraindicated. Patient requires contact precautions if  hospitalized. CRITICAL RESULT CALLED TO, READ BACK BY AND VERIFIED WITH: PHARMD G. ABBOTT 11/19/22 @ 338 BY AB    Staphylococcus epidermidis NOT DETECTED NOT DETECTED Final   Staphylococcus lugdunensis NOT DETECTED NOT DETECTED Final   Streptococcus species NOT DETECTED NOT DETECTED Final   Streptococcus agalactiae NOT DETECTED NOT DETECTED Final   Streptococcus pneumoniae NOT DETECTED NOT DETECTED Final   Streptococcus pyogenes NOT DETECTED NOT DETECTED Final   A.calcoaceticus-baumannii NOT DETECTED NOT DETECTED Final   Bacteroides fragilis NOT DETECTED NOT DETECTED Final   Enterobacterales NOT DETECTED NOT DETECTED Final   Enterobacter cloacae complex NOT DETECTED NOT DETECTED Final   Escherichia coli NOT DETECTED NOT DETECTED Final   Klebsiella aerogenes NOT DETECTED  NOT DETECTED Final   Klebsiella oxytoca NOT DETECTED NOT DETECTED Final   Klebsiella pneumoniae NOT DETECTED NOT DETECTED Final   Proteus species NOT DETECTED NOT DETECTED Final   Salmonella species NOT DETECTED NOT DETECTED Final   Serratia marcescens NOT DETECTED NOT DETECTED Final   Haemophilus influenzae NOT DETECTED NOT DETECTED Final   Neisseria meningitidis NOT DETECTED NOT DETECTED Final   Pseudomonas aeruginosa NOT DETECTED NOT DETECTED Final   Stenotrophomonas maltophilia NOT DETECTED NOT DETECTED Final   Candida albicans NOT DETECTED NOT DETECTED Final   Candida auris NOT DETECTED NOT DETECTED Final   Candida glabrata NOT DETECTED NOT DETECTED Final   Candida krusei NOT DETECTED NOT DETECTED Final   Candida parapsilosis NOT DETECTED NOT DETECTED Final   Candida tropicalis NOT DETECTED NOT DETECTED Final   Cryptococcus neoformans/gattii NOT DETECTED NOT DETECTED Final   Meth resistant mecA/C and MREJ DETECTED (A) NOT DETECTED Final    Comment: CRITICAL RESULT CALLED TO, READ BACK BY AND VERIFIED WITH: PHARMD G. ABBOTT 11/19/22 @ 338 BY AB Performed at [facility], [address] [place], [place] [address]   Blood culture (routine x 2)     Status: None  Collection Time: 11/19/22  3:16 AM   Specimen: BLOOD  Result Value Ref Range Status   Specimen Description BLOOD BLOOD LEFT HAND  Final   Special Requests   Final    BOTTLES DRAWN AEROBIC AND ANAEROBIC Blood Culture adequate volume   Culture   Final    NO GROWTH 5 DAYS Performed at [facility], [address], [place], [place] [address]    Report Status 11/24/2022 FINAL  Final  Culture, blood (Routine X 2) w Reflex to ID Panel     Status: Abnormal   Collection Time: 11/20/22  2:20 AM   Specimen: BLOOD LEFT HAND  Result Value Ref Range Status   Specimen Description BLOOD LEFT HAND  Final   Special Requests IN PEDIATRIC BOTTLE Blood Culture adequate volume  Final   Culture  Setup Time   Final    AEROBIC  BOTTLE ONLY GRAM POSITIVE COCCI IN CLUSTERS CRITICAL VALUE NOTED.  VALUE IS CONSISTENT WITH PREVIOUSLY REPORTED AND CALLED VALUE.    Culture (A)  Final    STAPHYLOCOCCUS AUREUS SUSCEPTIBILITIES PERFORMED ON PREVIOUS CULTURE WITHIN THE LAST 5 DAYS. Performed at [facility], [place] [address] [place], [place] [address]    Report Status 11/21/2022 FINAL  Final  Culture, blood (Routine X 2) w Reflex to ID Panel     Status: Abnormal   Collection Time: 11/20/22  6:09 AM   Specimen: BLOOD LEFT HAND  Result Value Ref Range Status   Specimen Description BLOOD LEFT HAND  Final   Special Requests   Final    BOTTLES DRAWN AEROBIC ONLY Blood Culture results may not be optimal due to an inadequate volume of blood received in culture bottles   Culture  Setup Time   Final    AEROBIC BOTTLE ONLY GRAM POSITIVE COCCI IN CLUSTERS CRITICAL VALUE NOTED.  VALUE IS CONSISTENT WITH PREVIOUSLY REPORTED AND CALLED VALUE.    Culture (A)  Final    STAPHYLOCOCCUS AUREUS SUSCEPTIBILITIES PERFORMED ON PREVIOUS CULTURE WITHIN THE LAST 5 DAYS. Performed at [facility], [place] [address] [place], [place] [address]    Report Status 11/21/2022 FINAL  Final  Culture, blood (Routine X 2) w Reflex to ID Panel     Status: Abnormal   Collection Time: 11/21/22 10:04 AM   Specimen: BLOOD LEFT HAND  Result Value Ref Range Status   Specimen Description BLOOD LEFT HAND  Final   Special Requests   Final    BOTTLES DRAWN AEROBIC AND ANAEROBIC Blood Culture adequate volume   Culture  Setup Time   Final    GRAM POSITIVE COCCI IN BOTH AEROBIC AND ANAEROBIC BOTTLES CRITICAL VALUE NOTED.  VALUE IS CONSISTENT WITH PREVIOUSLY REPORTED AND CALLED VALUE.    Culture (A)  Final    STAPHYLOCOCCUS AUREUS SUSCEPTIBILITIES PERFORMED ON PREVIOUS CULTURE WITHIN THE LAST 5 DAYS. Performed at [facility], [place] [address] [place], [place] [address]    Report Status 11/24/2022 FINAL  Final  Culture, blood (Routine X 2) w  Reflex to ID Panel     Status: Abnormal   Collection Time: 11/21/22 10:14 AM   Specimen: BLOOD  Result Value Ref Range Status   Specimen Description BLOOD RIGHT ANTECUBITAL  Final   Special Requests   Final    BOTTLES DRAWN AEROBIC ONLY Blood Culture adequate volume   Culture  Setup Time   Final    GRAM POSITIVE COCCI AEROBIC BOTTLE ONLY Gram Stain Report Called to,Read Back  By and Verified With: PHARMD G. ABBOTT 11/21/22 @2321  BY AB    Culture (A)  Final    STAPHYLOCOCCUS AUREUS SUSCEPTIBILITIES PERFORMED ON PREVIOUS CULTURE WITHIN THE LAST 5 DAYS. Performed at [facility], [address], [place], [place]    Report Status 11/24/2022 FINAL  Final  Culture, blood (Routine X 2) w Reflex to ID Panel     Status: Abnormal   Collection Time: 11/22/22 12:03 PM   Specimen: BLOOD LEFT HAND  Result Value Ref Range Status   Specimen Description BLOOD LEFT HAND  Final   Special Requests   Final    BOTTLES DRAWN AEROBIC AND ANAEROBIC Blood Culture adequate volume   Culture  Setup Time   Final    GRAM POSITIVE COCCI IN CLUSTERS ANAEROBIC BOTTLE ONLY CRITICAL VALUE NOTED.  VALUE IS CONSISTENT WITH PREVIOUSLY REPORTED AND CALLED VALUE.    Culture (A)  Final    STAPHYLOCOCCUS AUREUS SUSCEPTIBILITIES PERFORMED ON PREVIOUS CULTURE WITHIN THE LAST 5 DAYS. Performed at [facility], [address], [place], [place]    Report Status 11/25/2022 FINAL  Final  Culture, blood (Routine X 2) w Reflex to ID Panel     Status: Abnormal   Collection Time: 11/22/22 12:24 PM   Specimen: BLOOD RIGHT HAND  Result Value Ref Range Status   Specimen Description BLOOD RIGHT HAND  Final   Special Requests   Final    BOTTLES DRAWN AEROBIC AND ANAEROBIC Blood Culture adequate volume   Culture  Setup Time   Final    GRAM POSITIVE COCCI IN CLUSTERS IN BOTH AEROBIC AND ANAEROBIC BOTTLES Gram Stain Report Called to,Read Back By and Verified With: PHARMD LEDFORD 11/23/22 @ 0512 BY  AB    Culture (A)  Final    STAPHYLOCOCCUS AUREUS SUSCEPTIBILITIES PERFORMED ON PREVIOUS CULTURE WITHIN THE LAST 5 DAYS. Performed at [facility], [address], [place], [place]    Report Status 11/25/2022 FINAL  Final  Culture, blood (Routine X 2) w Reflex to ID Panel     Status: None (Preliminary result)   Collection Time: 11/23/22  1:03 PM   Specimen: BLOOD LEFT ARM  Result Value Ref Range Status   Specimen Description BLOOD LEFT ARM  Final   Special Requests   Final    BOTTLES DRAWN AEROBIC ONLY Blood Culture adequate volume   Culture   Final    NO GROWTH 3 DAYS Performed at [facility], [address], [place], [place]    Report Status PENDING  Incomplete  Culture, blood (Routine X 2) w Reflex to ID Panel     Status: None (Preliminary result)   Collection Time: 11/23/22  1:04 PM   Specimen: BLOOD LEFT ARM  Result Value Ref Range Status   Specimen Description BLOOD LEFT ARM  Final   Special Requests   Final    BOTTLES DRAWN AEROBIC ONLY Blood Culture adequate volume   Culture   Final    NO GROWTH 3 DAYS Performed at [facility], [address] [place], [place]    Report Status PENDING  Incomplete   Pertinent Lab.    Latest Ref Rng & Units 11/25/2022    9:36 AM 11/19/2022    3:16 AM 11/18/2022    8:28 AM  CBC  WBC 4.0 - 10.5 K/uL 13.9  15.6  15.6   Hemoglobin 12.0 - 15.0 g/dL 01/17/2023  75.6  12.6  Hematocrit 36.0 - 46.0 % 35.7  31.8  36.4   Platelets 150 - 400 K/uL 374  194  216       Latest Ref Rng & Units 11/25/2022    9:36 AM 11/24/2022   10:20 AM 11/23/2022    8:09 AM  CMP  Glucose 70 - 99 mg/dL 120  102  101   BUN 6 - 20 mg/dL 7  6  6    Creatinine 0.44 - 1.00 mg/dL 0.54  0.57  0.50   Sodium 135 - 145 mmol/L 138  134  135   Potassium 3.5 - 5.1 mmol/L 4.3  3.6  4.3   Chloride 98 - 111 mmol/L 103  100  102   CO2 22 - 32 mmol/L 25  25  24    Calcium 8.9 - 10.3 mg/dL 8.4  8.0  7.7      Pertinent Imaging  today Plain films and CT images have been personally visualized and interpreted; radiology reports have been reviewed. Decision making incorporated into the Impression / Recommendations.  No results found.   I spent 51  minutes for this patient encounter including review of prior medical records, coordination of care with primary/other specialist with greater than 50% of time being face to face/counseling and discussing diagnostics/treatment plan with the patient/family.  Electronically signed by:   Rosiland Oz, MD Infectious Disease Physician [facility] for Infectious Disease Pager: [phone]

## 2022-11-26 NOTE — TOC Transition Note (Signed)
Discharge medications (1) are being stored in the main pharmacy on the ground floor until patient is ready for discharge.   

## 2022-11-26 NOTE — Progress Notes (Signed)
PROGRESS NOTE        PATIENT DETAILS Name: Cassandra Roberts Age: 32 y.o. Sex: female Date of Birth: [birth year] Admit Date: 11/18/2022 Admitting Physician Marinda Elk, MD VWU:[id number], No Pcp Per  Brief Summary: Patient is a 32 y.o.  female with history of IVDA-recent hospitalization at [facility] for multifocal pneumonia-left AMA-presented with chest tightness/shortness of breath-she was found to have sepsis due to multifocal pneumonia-and subsequently blood cultures positive for MRSA.  Significant events: 1/11>> admit to [facility]-sepsis-multifocal PNA-blood cultures subsequently positive for MRSA.  Significant studies: 1/11>> CT chest: Multifocal lung consolidation-greatest in the left side. 1/12>> TTE: EF 70-75%-no obvious vegetation 1/15>> MRI left shoulder: Moderate ill-defined fluid between the deltoid, infraspinatus and teres minor muscles.  No well-defined fluid collection identified. 1/15>> MRI left femur: Motion degraded exam-nondiagnostic exam. 1/16>> MRI left hip: Mild to moderate fluid tracking along the anterior aspect of the left iliopsoas junction-nonspecific-may represent bursitis.  Significant microbiology data: 1/11>> COVID/influenza/RSV PCR: Negative 1/11>> blood culture: MRSA 1/13>> blood culture: MRSA 1/14>> blood culture: MRSA 1/15>> blood culture: MRSA 1/16>> blood culture: Negative  Procedures: 1/17>> TEE: No vegetation.  Consults: ID Orthopedics  Subjective: Appears much more comfortable-denies any pain in the left hip to me today.  Her pain for the past several days has been mostly in the right flank/right lower chest area-patient thinks it is from repeated bouts of coughing.  Objective: Vitals: Blood pressure 116/74, pulse 71, temperature 98.3 F (36.8 C), temperature source Oral, resp. rate 16, height 5\' 2"  (1.575 m), weight 56.7 kg, last menstrual period 11/04/2022, SpO2 93 %.   Exam: Gen  Exam:Alert awake-not in any distress HEENT:atraumatic, normocephalic Chest: B/L clear to auscultation anteriorly CVS:S1S2 regular Abdomen:soft non tender, non distended Extremities:no edema Neurology: Non focal Skin: no rash  Pertinent Labs/Radiology:    Latest Ref Rng & Units 11/25/2022    9:36 AM 11/19/2022    3:16 AM 11/18/2022    8:28 AM  CBC  WBC 4.0 - 10.5 K/uL 13.9  15.6  15.6   Hemoglobin 12.0 - 15.0 g/dL 01/17/2023  78.2  95.6   Hematocrit 36.0 - 46.0 % 35.7  31.8  36.4   Platelets 150 - 400 K/uL 374  194  216     Lab Results  Component Value Date   NA 138 11/25/2022   K 4.3 11/25/2022   CL 103 11/25/2022   CO2 25 11/25/2022      Assessment/Plan: Sepsis secondary to MRSA bacteremia/septic emboli Sepsis physiology resolved Blood cultures until 1/15 were positive, however blood cultures on 1/16-negative x 3 days. TEE negative Numerous imaging studies negative for any metastatic sites of infection Clinically she appears to be stabilizing-plan is to continue IV antibiotics-and monitor clinically.  If needed-we can probably repeat imaging over the next week or so to see if she has developed metastatic sites of infection.   She is not a candidate for outpatient IV antibiotics and will need to complete recommended course of IV antibiotics as an inpatient.    History of polysubstance abuse/IVDU (UDS positive for cocaine/amphetamines) Counseled  Chronic HCV infection Outpatient management  BMI: Estimated body mass index is 22.86 kg/m as calculated from the following:   Height as of this encounter: 5\' 2"  (1.575 m).   Weight as of this encounter: 56.7 kg.   Code status:   Code Status: Full  Code   DVT Prophylaxis: enoxaparin (LOVENOX) injection 40 mg Start: 11/18/22 1930   Family Communication: None at bedside   Disposition Plan: Status is: Inpatient Remains inpatient appropriate because: Severity of illness.   Planned Discharge Destination:Home   Diet: Diet  Order             Diet regular Room service appropriate? Yes; Fluid consistency: Thin  Diet effective now                     Antimicrobial agents: Anti-infectives (From admission, onward)    Start     Dose/Rate Route Frequency Ordered Stop   11/23/22 1400  DAPTOmycin (CUBICIN) 500 mg in sodium chloride 0.9 % IVPB        500 mg 120 mL/hr over 30 Minutes Intravenous Daily 11/23/22 0921     11/23/22 1000  ceftaroline (TEFLARO) 600 mg in sodium chloride 0.9 % 100 mL IVPB        600 mg 100 mL/hr over 60 Minutes Intravenous Every 8 hours 11/23/22 0921     11/22/22 1445  vancomycin (VANCOCIN) IVPB 1000 mg/200 mL premix  Status:  Discontinued        1,000 mg 200 mL/hr over 60 Minutes Intravenous Every 8 hours 11/22/22 1347 11/23/22 0921   11/19/22 0200  vancomycin (VANCOCIN) IVPB 750 mg/150 ml premix  Status:  Discontinued        750 mg 150 mL/hr over 60 Minutes Intravenous Every 12 hours 11/18/22 1354 11/19/22 0110   11/19/22 0200  vancomycin (VANCOREADY) IVPB 750 mg/150 mL  Status:  Discontinued        750 mg 150 mL/hr over 60 Minutes Intravenous Every 12 hours 11/19/22 0110 11/22/22 1347   11/19/22 0000  linezolid (ZYVOX) 600 MG tablet        600 mg Oral 2 times daily 11/19/22 1045 12/31/22 2359   11/18/22 1330  ceFEPIme (MAXIPIME) 2 g in sodium chloride 0.9 % 100 mL IVPB        2 g 200 mL/hr over 30 Minutes Intravenous  Once 11/18/22 1324 11/18/22 1408   11/18/22 1330  Vancomycin (VANCOCIN) 1,250 mg in sodium chloride 0.9 % 250 mL IVPB        1,250 mg 166.7 mL/hr over 90 Minutes Intravenous  Once 11/18/22 1329 11/18/22 1652        MEDICATIONS: Scheduled Meds:  enoxaparin (LOVENOX) injection  40 mg Subcutaneous Q24H   metoprolol tartrate  25 mg Oral BID   morphine  30 mg Oral Q12H   nicotine  14 mg Transdermal Daily   Continuous Infusions:  ceFTAROline (TEFLARO) IV 600 mg (11/26/22 0229)   DAPTOmycin (CUBICIN) 500 mg in sodium chloride 0.9 % IVPB 500 mg (11/25/22  2017)   PRN Meds:.acetaminophen **OR** acetaminophen, metoprolol tartrate, ondansetron **OR** ondansetron (ZOFRAN) IV, polyethylene glycol   I have personally reviewed following labs and imaging studies  LABORATORY DATA: CBC: Recent Labs  Lab 11/25/22 0936  WBC 13.9*  HGB 11.8*  HCT 35.7*  MCV 87.9  PLT 374     Basic Metabolic Panel: Recent Labs  Lab 11/20/22 0220 11/23/22 0809 11/24/22 1020 11/25/22 0936  NA 134* 135 134* 138  K 4.3 4.3 3.6 4.3  CL 102 102 100 103  CO2 23 24 25 25   GLUCOSE 99 101* 102* 120*  BUN 9 6 6 7   CREATININE 0.60 0.50 0.57 0.54  CALCIUM 8.2* 7.7* 8.0* 8.4*     GFR: Estimated Creatinine Clearance: 80.6 mL/min (  by C-G formula based on SCr of 0.54 mg/dL).  Liver Function Tests: No results for input(s): "AST", "ALT", "ALKPHOS", "BILITOT", "PROT", "ALBUMIN" in the last 168 hours.  No results for input(s): "LIPASE", "AMYLASE" in the last 168 hours. No results for input(s): "AMMONIA" in the last 168 hours.  Coagulation Profile: Recent Labs  Lab 11/24/22 1020  INR 1.1     Cardiac Enzymes: Recent Labs  Lab 11/23/22 1303  CKTOTAL 16*     BNP (last 3 results) No results for input(s): "PROBNP" in the last 8760 hours.  Lipid Profile: No results for input(s): "CHOL", "HDL", "LDLCALC", "TRIG", "CHOLHDL", "LDLDIRECT" in the last 72 hours.  Thyroid Function Tests: No results for input(s): "TSH", "T4TOTAL", "FREET4", "T3FREE", "THYROIDAB" in the last 72 hours.  Anemia Panel: No results for input(s): "VITAMINB12", "FOLATE", "FERRITIN", "TIBC", "IRON", "RETICCTPCT" in the last 72 hours.  Urine analysis:    Component Value Date/Time   COLORURINE YELLOW 11/18/2022 1020   APPEARANCEUR CLOUDY (A) 11/18/2022 1020   LABSPEC 1.009 11/18/2022 1020   PHURINE 6.0 11/18/2022 1020   GLUCOSEU NEGATIVE 11/18/2022 1020   HGBUR SMALL (A) 11/18/2022 1020   BILIRUBINUR NEGATIVE 11/18/2022 1020   KETONESUR NEGATIVE 11/18/2022 1020   PROTEINUR  NEGATIVE 11/18/2022 1020   UROBILINOGEN 0.2 07/02/2015 1044   NITRITE POSITIVE (A) 11/18/2022 1020   LEUKOCYTESUR MODERATE (A) 11/18/2022 1020    Sepsis Labs: Lactic Acid, Venous    Component Value Date/Time   LATICACIDVEN 1.4 11/18/2022 1303    MICROBIOLOGY: Recent Results (from the past 240 hour(s))  Resp panel by RT-PCR (RSV, Flu A&B, Covid) Anterior Nasal Swab     Status: None   Collection Time: 11/18/22 10:08 AM   Specimen: Anterior Nasal Swab  Result Value Ref Range Status   SARS Coronavirus 2 by RT PCR NEGATIVE NEGATIVE Final    Comment: (NOTE) SARS-CoV-2 target nucleic acids are NOT DETECTED.  The SARS-CoV-2 RNA is generally detectable in upper respiratory specimens during the acute phase of infection. The lowest concentration of SARS-CoV-2 viral copies this assay can detect is 138 copies/mL. A negative result does not preclude SARS-Cov-2 infection and should not be used as the sole basis for treatment or other patient management decisions. A negative result may occur with  improper specimen collection/handling, submission of specimen other than nasopharyngeal swab, presence of viral mutation(s) within the areas targeted by this assay, and inadequate number of viral copies(<138 copies/mL). A negative result must be combined with clinical observations, patient history, and epidemiological information. The expected result is Negative.  Fact Sheet for Patients:  EntrepreneurPulse.com.au  Fact Sheet for Healthcare Providers:  IncredibleEmployment.be  This test is no t yet approved or cleared by the [place] FDA and  has been authorized for detection and/or diagnosis of SARS-CoV-2 by FDA under an Emergency Use Authorization (EUA). This EUA will remain  in effect (meaning this test can be used) for the duration of the COVID-19 declaration under Section 564(b)(1) of the Act, 21 U.S.C.section 360bbb-3(b)(1), unless the  authorization is terminated  or revoked sooner.       Influenza A by PCR NEGATIVE NEGATIVE Final   Influenza B by PCR NEGATIVE NEGATIVE Final    Comment: (NOTE) The Xpert Xpress SARS-CoV-2/FLU/RSV plus assay is intended as an aid in the diagnosis of influenza from Nasopharyngeal swab specimens and should not be used as a sole basis for treatment. Nasal washings and aspirates are unacceptable for Xpert Xpress SARS-CoV-2/FLU/RSV testing.  Fact Sheet for Patients: EntrepreneurPulse.com.au  Fact  Sheet for Healthcare Providers: SeriousBroker.ithttps://www.fda.gov/media/152162/download  This test is not yet approved or cleared by the [place] FDA and has been authorized for detection and/or diagnosis of SARS-CoV-2 by FDA under an Emergency Use Authorization (EUA). This EUA will remain in effect (meaning this test can be used) for the duration of the COVID-19 declaration under Section 564(b)(1) of the Act, 21 U.S.C. section 360bbb-3(b)(1), unless the authorization is terminated or revoked.     Resp Syncytial Virus by PCR NEGATIVE NEGATIVE Final    Comment: (NOTE) Fact Sheet for Patients: BloggerCourse.comhttps://www.fda.gov/media/152166/download  Fact Sheet for Healthcare Providers: SeriousBroker.ithttps://www.fda.gov/media/152162/download  This test is not yet approved or cleared by the [place] FDA and has been authorized for detection and/or diagnosis of SARS-CoV-2 by FDA under an Emergency Use Authorization (EUA). This EUA will remain in effect (meaning this test can be used) for the duration of the COVID-19 declaration under Section 564(b)(1) of the Act, 21 U.S.C. section 360bbb-3(b)(1), unless the authorization is terminated or revoked.  Performed at [facility], [address] [place], [place] [address]   Blood culture (routine x 2)     Status: Abnormal   Collection Time: 11/18/22 12:07 PM   Specimen: BLOOD  Result Value Ref Range Status   Specimen Description BLOOD SITE NOT  SPECIFIED  Final   Special Requests   Final    BOTTLES DRAWN AEROBIC AND ANAEROBIC Blood Culture adequate volume   Culture  Setup Time   Final    GRAM POSITIVE COCCI IN BOTH AEROBIC AND ANAEROBIC BOTTLES CRITICAL RESULT CALLED TO, READ BACK BY AND VERIFIED WITH: PHARMD G. ABBOTT 11/19/22 @ 338 BY AB Performed at [facility], [address] [place], [place] [address]    Culture METHICILLIN RESISTANT STAPHYLOCOCCUS AUREUS (A)  Final   Report Status 11/21/2022 FINAL  Final   Organism ID, Bacteria METHICILLIN RESISTANT STAPHYLOCOCCUS AUREUS  Final      Susceptibility   Methicillin resistant staphylococcus aureus - MIC*    CIPROFLOXACIN >=8 RESISTANT Resistant     ERYTHROMYCIN >=8 RESISTANT Resistant     GENTAMICIN <=0.5 SENSITIVE Sensitive     OXACILLIN >=4 RESISTANT Resistant     TETRACYCLINE <=1 SENSITIVE Sensitive     VANCOMYCIN <=0.5 SENSITIVE Sensitive     TRIMETH/SULFA <=10 SENSITIVE Sensitive     CLINDAMYCIN >=8 RESISTANT Resistant     RIFAMPIN <=0.5 SENSITIVE Sensitive     Inducible Clindamycin NEGATIVE Sensitive     * METHICILLIN RESISTANT STAPHYLOCOCCUS AUREUS  Blood Culture ID Panel (Reflexed)     Status: Abnormal   Collection Time: 11/18/22 12:07 PM  Result Value Ref Range Status   Enterococcus faecalis NOT DETECTED NOT DETECTED Final   Enterococcus Faecium NOT DETECTED NOT DETECTED Final   Listeria monocytogenes NOT DETECTED NOT DETECTED Final   Staphylococcus species DETECTED (A) NOT DETECTED Final    Comment: CRITICAL RESULT CALLED TO, READ BACK BY AND VERIFIED WITH: PHARMD G. ABBOTT 11/19/22 @ 338 BY AB    Staphylococcus aureus (BCID) DETECTED (A) NOT DETECTED Final    Comment: Methicillin (oxacillin)-resistant Staphylococcus aureus (MRSA). MRSA is predictably resistant to beta-lactam antibiotics (except ceftaroline). Preferred therapy is vancomycin unless clinically contraindicated. Patient requires contact precautions if  hospitalized. CRITICAL RESULT CALLED  TO, READ BACK BY AND VERIFIED WITH: PHARMD G. ABBOTT 11/19/22 @ 338 BY AB    Staphylococcus epidermidis NOT DETECTED NOT DETECTED Final   Staphylococcus lugdunensis NOT DETECTED NOT DETECTED Final   Streptococcus species NOT DETECTED NOT DETECTED  Final   Streptococcus agalactiae NOT DETECTED NOT DETECTED Final   Streptococcus pneumoniae NOT DETECTED NOT DETECTED Final   Streptococcus pyogenes NOT DETECTED NOT DETECTED Final   A.calcoaceticus-baumannii NOT DETECTED NOT DETECTED Final   Bacteroides fragilis NOT DETECTED NOT DETECTED Final   Enterobacterales NOT DETECTED NOT DETECTED Final   Enterobacter cloacae complex NOT DETECTED NOT DETECTED Final   Escherichia coli NOT DETECTED NOT DETECTED Final   Klebsiella aerogenes NOT DETECTED NOT DETECTED Final   Klebsiella oxytoca NOT DETECTED NOT DETECTED Final   Klebsiella pneumoniae NOT DETECTED NOT DETECTED Final   Proteus species NOT DETECTED NOT DETECTED Final   Salmonella species NOT DETECTED NOT DETECTED Final   Serratia marcescens NOT DETECTED NOT DETECTED Final   Haemophilus influenzae NOT DETECTED NOT DETECTED Final   Neisseria meningitidis NOT DETECTED NOT DETECTED Final   Pseudomonas aeruginosa NOT DETECTED NOT DETECTED Final   Stenotrophomonas maltophilia NOT DETECTED NOT DETECTED Final   Candida albicans NOT DETECTED NOT DETECTED Final   Candida auris NOT DETECTED NOT DETECTED Final   Candida glabrata NOT DETECTED NOT DETECTED Final   Candida krusei NOT DETECTED NOT DETECTED Final   Candida parapsilosis NOT DETECTED NOT DETECTED Final   Candida tropicalis NOT DETECTED NOT DETECTED Final   Cryptococcus neoformans/gattii NOT DETECTED NOT DETECTED Final   Meth resistant mecA/C and MREJ DETECTED (A) NOT DETECTED Final    Comment: CRITICAL RESULT CALLED TO, READ BACK BY AND VERIFIED WITH: PHARMD G. ABBOTT 11/19/22 @ 338 BY AB Performed at [facility], [address] [place], [place] [address]   Blood culture (routine x 2)      Status: None   Collection Time: 11/19/22  3:16 AM   Specimen: BLOOD  Result Value Ref Range Status   Specimen Description BLOOD BLOOD LEFT HAND  Final   Special Requests   Final    BOTTLES DRAWN AEROBIC AND ANAEROBIC Blood Culture adequate volume   Culture   Final    NO GROWTH 5 DAYS Performed at [facility], [address] [place], [place] [address]    Report Status 11/24/2022 FINAL  Final  Culture, blood (Routine X 2) w Reflex to ID Panel     Status: Abnormal   Collection Time: 11/20/22  2:20 AM   Specimen: BLOOD LEFT HAND  Result Value Ref Range Status   Specimen Description BLOOD LEFT HAND  Final   Special Requests IN PEDIATRIC BOTTLE Blood Culture adequate volume  Final   Culture  Setup Time   Final    AEROBIC BOTTLE ONLY GRAM POSITIVE COCCI IN CLUSTERS CRITICAL VALUE NOTED.  VALUE IS CONSISTENT WITH PREVIOUSLY REPORTED AND CALLED VALUE.    Culture (A)  Final    STAPHYLOCOCCUS AUREUS SUSCEPTIBILITIES PERFORMED ON PREVIOUS CULTURE WITHIN THE LAST 5 DAYS. Performed at [facility], [address] [place], [place] [address]    Report Status 11/21/2022 FINAL  Final  Culture, blood (Routine X 2) w Reflex to ID Panel     Status: Abnormal   Collection Time: 11/20/22  6:09 AM   Specimen: BLOOD LEFT HAND  Result Value Ref Range Status   Specimen Description BLOOD LEFT HAND  Final   Special Requests   Final    BOTTLES DRAWN AEROBIC ONLY Blood Culture results may not be optimal due to an inadequate volume of blood received in culture bottles   Culture  Setup Time   Final    AEROBIC BOTTLE ONLY GRAM POSITIVE COCCI  IN CLUSTERS CRITICAL VALUE NOTED.  VALUE IS CONSISTENT WITH PREVIOUSLY REPORTED AND CALLED VALUE.    Culture (A)  Final    STAPHYLOCOCCUS AUREUS SUSCEPTIBILITIES PERFORMED ON PREVIOUS CULTURE WITHIN THE LAST 5 DAYS. Performed at [facility], [place] [address], [place], [place] [address]    Report Status 11/21/2022 FINAL  Final  Culture, blood  (Routine X 2) w Reflex to ID Panel     Status: Abnormal   Collection Time: 11/21/22 10:04 AM   Specimen: BLOOD LEFT HAND  Result Value Ref Range Status   Specimen Description BLOOD LEFT HAND  Final   Special Requests   Final    BOTTLES DRAWN AEROBIC AND ANAEROBIC Blood Culture adequate volume   Culture  Setup Time   Final    GRAM POSITIVE COCCI IN BOTH AEROBIC AND ANAEROBIC BOTTLES CRITICAL VALUE NOTED.  VALUE IS CONSISTENT WITH PREVIOUSLY REPORTED AND CALLED VALUE.    Culture (A)  Final    STAPHYLOCOCCUS AUREUS SUSCEPTIBILITIES PERFORMED ON PREVIOUS CULTURE WITHIN THE LAST 5 DAYS. Performed at [facility], [place] [address] [place], [place] [address]    Report Status 11/24/2022 FINAL  Final  Culture, blood (Routine X 2) w Reflex to ID Panel     Status: Abnormal   Collection Time: 11/21/22 10:14 AM   Specimen: BLOOD  Result Value Ref Range Status   Specimen Description BLOOD RIGHT ANTECUBITAL  Final   Special Requests   Final    BOTTLES DRAWN AEROBIC ONLY Blood Culture adequate volume   Culture  Setup Time   Final    GRAM POSITIVE COCCI AEROBIC BOTTLE ONLY Gram Stain Report Called to,Read Back By and Verified With: PHARMD G. ABBOTT 11/21/22 @2321  BY AB    Culture (A)  Final    STAPHYLOCOCCUS AUREUS SUSCEPTIBILITIES PERFORMED ON PREVIOUS CULTURE WITHIN THE LAST 5 DAYS. Performed at [facility], [place] [address] [place], [place] [address]    Report Status 11/24/2022 FINAL  Final  Culture, blood (Routine X 2) w Reflex to ID Panel     Status: Abnormal   Collection Time: 11/22/22 12:03 PM   Specimen: BLOOD LEFT HAND  Result Value Ref Range Status   Specimen Description BLOOD LEFT HAND  Final   Special Requests   Final    BOTTLES DRAWN AEROBIC AND ANAEROBIC Blood Culture adequate volume   Culture  Setup Time   Final    GRAM POSITIVE COCCI IN CLUSTERS ANAEROBIC BOTTLE ONLY CRITICAL VALUE NOTED.  VALUE IS CONSISTENT WITH PREVIOUSLY REPORTED AND CALLED VALUE.     Culture (A)  Final    STAPHYLOCOCCUS AUREUS SUSCEPTIBILITIES PERFORMED ON PREVIOUS CULTURE WITHIN THE LAST 5 DAYS. Performed at [facility], [place] [address], [place], [place] [address]    Report Status 11/25/2022 FINAL  Final  Culture, blood (Routine X 2) w Reflex to ID Panel     Status: Abnormal   Collection Time: 11/22/22 12:24 PM   Specimen: BLOOD RIGHT HAND  Result Value Ref Range Status   Specimen Description BLOOD RIGHT HAND  Final   Special Requests   Final    BOTTLES DRAWN AEROBIC AND ANAEROBIC Blood Culture adequate volume   Culture  Setup Time   Final    GRAM POSITIVE COCCI IN CLUSTERS IN BOTH AEROBIC AND ANAEROBIC BOTTLES Gram Stain Report Called to,Read Back By and Verified With: PHARMD LEDFORD 11/23/22 @ 0512 BY AB    Culture (A)  Final    STAPHYLOCOCCUS AUREUS  SUSCEPTIBILITIES PERFORMED ON PREVIOUS CULTURE WITHIN THE LAST 5 DAYS. Performed at [facility], [address] [place], [place] [address]    Report Status 11/25/2022 FINAL  Final  Culture, blood (Routine X 2) w Reflex to ID Panel     Status: None (Preliminary result)   Collection Time: 11/23/22  1:03 PM   Specimen: BLOOD LEFT ARM  Result Value Ref Range Status   Specimen Description BLOOD LEFT ARM  Final   Special Requests   Final    BOTTLES DRAWN AEROBIC ONLY Blood Culture adequate volume   Culture   Final    NO GROWTH 3 DAYS Performed at [facility], [address] [place], [place] [address]    Report Status PENDING  Incomplete  Culture, blood (Routine X 2) w Reflex to ID Panel     Status: None (Preliminary result)   Collection Time: 11/23/22  1:04 PM   Specimen: BLOOD LEFT ARM  Result Value Ref Range Status   Specimen Description BLOOD LEFT ARM  Final   Special Requests   Final    BOTTLES DRAWN AEROBIC ONLY Blood Culture adequate volume   Culture   Final    NO GROWTH 3 DAYS Performed at [facility], [address], [place], [place] [address]    Report Status PENDING   Incomplete    RADIOLOGY STUDIES/RESULTS: ECHO TEE  Result Date: 11/25/2022    TRANSESOPHOGEAL ECHO REPORT   Patient Name:   [facility]ONICA [facility] Date of Exam: 11/24/2022 Medical Rec #:  [id number]      Height:       62.0 in Accession #:    [id number]     Weight:       125.0 lb Date of Birth:  Jul 04, 1991      BSA:          1.566 m Patient Age:    31 years       BP:           126/82 mmHg Patient Gender: F              HR:           90 bpm. Exam Location:  Inpatient Procedure: Transesophageal Echo and Color Doppler Indications:     Bacteremia  History:         Patient has prior history of Echocardiogram examinations, most                  recent 11/19/2022. Signs/Symptoms:Bacteremia; Risk Factors:IV                  drug use.  Sonographer:     Delcie Roch RDCS Referring Phys:  [address] [place] Diagnosing Phys: Donato Schultz MD PROCEDURE: After discussion of the risks and benefits of a TEE, an informed consent was obtained from the patient. The transesophogeal probe was passed without difficulty through the esophogus of the patient. Imaged were obtained with the patient in a left lateral decubitus position. Sedation performed by different physician. The patient was monitored while under deep sedation. Anesthestetic sedation was provided intravenously by Anesthesiology: 169mg  of Propofol, 60mg  of Lidocaine. The patient's vital signs; including heart rate, blood pressure, and oxygen saturation; remained stable throughout the procedure. The patient developed no complications during the procedure.  IMPRESSIONS  1. Left ventricular ejection fraction, by estimation, is 60 to 65%. The left ventricle has normal function. The left ventricle has no regional wall motion abnormalities.  2. Right  ventricular systolic function is normal. The right ventricular size is normal.  3. No left atrial/left atrial appendage thrombus was detected.  4. The mitral valve is normal in structure. Trivial mitral valve regurgitation. No evidence  of mitral stenosis.  5. The aortic valve is normal in structure. Aortic valve regurgitation is not visualized. No aortic stenosis is present.  6. The inferior vena cava is normal in size with greater than 50% respiratory variability, suggesting right atrial pressure of 3 mmHg.  7. Evidence of atrial level shunting detected by color flow Doppler. Agitated saline contrast bubble study was positive with shunting observed within 3-6 cardiac cycles suggestive of interatrial shunt. There is a small patent foramen ovale. Conclusion(s)/Recommendation(s): No evidence of vegetation/infective endocarditis on this transesophageael echocardiogram. FINDINGS  Left Ventricle: Left ventricular ejection fraction, by estimation, is 60 to 65%. The left ventricle has normal function. The left ventricle has no regional wall motion abnormalities. The left ventricular internal cavity size was normal in size. There is  no left ventricular hypertrophy. Right Ventricle: The right ventricular size is normal. No increase in right ventricular wall thickness. Right ventricular systolic function is normal. Left Atrium: Left atrial size was normal in size. No left atrial/left atrial appendage thrombus was detected. Right Atrium: Right atrial size was normal in size. Pericardium: There is no evidence of pericardial effusion. Mitral Valve: The mitral valve is normal in structure. Trivial mitral valve regurgitation. No evidence of mitral valve stenosis. Tricuspid Valve: The tricuspid valve is normal in structure. Tricuspid valve regurgitation is not demonstrated. No evidence of tricuspid stenosis. Aortic Valve: The aortic valve is normal in structure. Aortic valve regurgitation is not visualized. No aortic stenosis is present. Pulmonic Valve: The pulmonic valve was normal in structure. Pulmonic valve regurgitation is not visualized. No evidence of pulmonic stenosis. Aorta: The aortic root is normal in size and structure. Venous: The inferior vena cava  is normal in size with greater than 50% respiratory variability, suggesting right atrial pressure of 3 mmHg. IAS/Shunts: Evidence of atrial level shunting detected by color flow Doppler. Agitated saline contrast was given intravenously to evaluate for intracardiac shunting. Agitated saline contrast bubble study was positive with shunting observed within 3-6 cardiac cycles suggestive of interatrial shunt. A small patent foramen ovale is detected. Donato SchultzMark Skains MD Electronically signed by Donato SchultzMark Skains MD Signature Date/Time: 11/25/2022/1:54:36 PM    Final      LOS: 8 days   Jeoffrey MassedShanker Elman Dettman, MD  [facility]    To contact the attending provider between 7A-7P or the covering provider during after hours 7P-7A, please log into the web site [email or web address] and access using universal [place] password for that web site. If you do not have the password, please call the hospital operator.  11/26/2022, 9:40 AM

## 2022-11-27 ENCOUNTER — Encounter (HOSPITAL_COMMUNITY): Payer: Self-pay | Admitting: Cardiology

## 2022-11-27 NOTE — Progress Notes (Signed)
PROGRESS NOTE        PATIENT DETAILS Name: Cassandra BussingChrista Roberts Age: 32 y.o. Sex: female Date of Birth: 24-Feb-1991 Admit Date: 11/18/2022 Admitting Physician Marinda ElkAbraham Feliz Ortiz, MD RUE:[id number]:Patient, No Pcp Per  Brief Summary: Patient is a 32 y.o.  female with history of IVDA-recent hospitalization [facility] for multifocal pneumonia-left AMA-presented with chest tightness/shortness of breath-she was found to have sepsis due to multifocal pneumonia-and subsequently blood cultures positive for MRSA.  Significant events: 1/11>> admit to [facility]-sepsis-multifocal PNA-blood cultures subsequently positive for MRSA.  Significant studies: 1/11>> CT chest: Multifocal lung consolidation-greatest in the left side. 1/12>> TTE: EF 70-75%-no obvious vegetation 1/15>> MRI left shoulder: Moderate ill-defined fluid between the deltoid, infraspinatus and teres minor muscles.  No well-defined fluid collection identified. 1/15>> MRI left femur: Motion degraded exam-nondiagnostic exam. 1/16>> MRI left hip: Mild to moderate fluid tracking along the anterior aspect of the left iliopsoas junction-nonspecific-may represent bursitis.  Significant microbiology data: 1/11>> COVID/influenza/RSV PCR: Negative 1/11>> blood culture: MRSA 1/13>> blood culture: MRSA 1/14>> blood culture: MRSA 1/15>> blood culture: MRSA 1/16>> blood culture: Negative  Procedures: 1/17>> TEE: No vegetation.  Consults: ID Orthopedics  Subjective: No left hip or right hip pain.  Some pain in the right flank/right chest area.  Appears comfortably lying in bed.  Objective: Vitals: Blood pressure 120/81, pulse 71, temperature 98.6 F (37 C), temperature source Oral, resp. rate 14, height 5\' 2"  (1.575 m), weight 56.7 kg, last menstrual period 11/04/2022, SpO2 94 %.   Exam: Gen Exam:Alert awake-not in any distress HEENT:atraumatic, normocephalic Chest: B/L clear to auscultation  anteriorly CVS:S1S2 regular Abdomen:soft non tender, non distended Extremities:no edema Neurology: Non focal Skin: no rash  Pertinent Labs/Radiology:    Latest Ref Rng & Units 11/25/2022    9:36 AM 11/19/2022    3:16 AM 11/18/2022    8:28 AM  CBC  WBC 4.0 - 10.5 K/uL 13.9  15.6  15.6   Hemoglobin 12.0 - 15.0 g/dL 19.111.8  47.811.2  29.512.6   Hematocrit 36.0 - 46.0 % 35.7  31.8  36.4   Platelets 150 - 400 K/uL 374  194  216     Lab Results  Component Value Date   NA 138 11/25/2022   K 4.3 11/25/2022   CL 103 11/25/2022   CO2 25 11/25/2022      Assessment/Plan: Sepsis secondary to MRSA bacteremia/septic emboli Sepsis physiology resolved Blood culture on 1/16 remains negative TEE negative Numerous imaging studies negative for metastatic sites of infection Clinically she appears to be stabilizing  Plan is to follow closely and reimage accordingly ID following and directing IV antibiotics-remains on daptomycin/Teflaro.     History of polysubstance abuse/IVDU (UDS positive for cocaine/amphetamines) Counseled  Chronic HCV infection Outpatient management  BMI: Estimated body mass index is 22.86 kg/m as calculated from the following:   Height as of this encounter: 5\' 2"  (1.575 m).   Weight as of this encounter: 56.7 kg.   Code status:   Code Status: Full Code   DVT Prophylaxis: enoxaparin (LOVENOX) injection 40 mg Start: 11/18/22 1930   Family Communication: None at bedside   Disposition Plan: Status is: Inpatient Remains inpatient appropriate because: Severity of illness.   Planned Discharge Destination:Home   Diet: Diet Order             Diet regular Room service appropriate? Yes;  Fluid consistency: Thin  Diet effective now                     Antimicrobial agents: Anti-infectives (From admission, onward)    Start     Dose/Rate Route Frequency Ordered Stop   11/23/22 1400  DAPTOmycin (CUBICIN) 500 mg in sodium chloride 0.9 % IVPB        500 mg 120  mL/hr over 30 Minutes Intravenous Daily 11/23/22 0921     11/23/22 1000  ceftaroline (TEFLARO) 600 mg in sodium chloride 0.9 % 100 mL IVPB        600 mg 100 mL/hr over 60 Minutes Intravenous Every 8 hours 11/23/22 0921     11/22/22 1445  vancomycin (VANCOCIN) IVPB 1000 mg/200 mL premix  Status:  Discontinued        1,000 mg 200 mL/hr over 60 Minutes Intravenous Every 8 hours 11/22/22 1347 11/23/22 0921   11/19/22 0200  vancomycin (VANCOCIN) IVPB 750 mg/150 ml premix  Status:  Discontinued        750 mg 150 mL/hr over 60 Minutes Intravenous Every 12 hours 11/18/22 1354 11/19/22 0110   11/19/22 0200  vancomycin (VANCOREADY) IVPB 750 mg/150 mL  Status:  Discontinued        750 mg 150 mL/hr over 60 Minutes Intravenous Every 12 hours 11/19/22 0110 11/22/22 1347   11/19/22 0000  linezolid (ZYVOX) 600 MG tablet        600 mg Oral 2 times daily 11/19/22 1045 12/31/22 2359   11/18/22 1330  ceFEPIme (MAXIPIME) 2 g in sodium chloride 0.9 % 100 mL IVPB        2 g 200 mL/hr over 30 Minutes Intravenous  Once 11/18/22 1324 11/18/22 1408   11/18/22 1330  Vancomycin (VANCOCIN) 1,250 mg in sodium chloride 0.9 % 250 mL IVPB        1,250 mg 166.7 mL/hr over 90 Minutes Intravenous  Once 11/18/22 1329 11/18/22 1652        MEDICATIONS: Scheduled Meds:  enoxaparin (LOVENOX) injection  40 mg Subcutaneous Q24H   metoprolol tartrate  25 mg Oral BID   morphine  30 mg Oral Q12H   nicotine  14 mg Transdermal Daily   Continuous Infusions:  ceFTAROline (TEFLARO) IV 600 mg (11/27/22 0842)   DAPTOmycin (CUBICIN) 500 mg in sodium chloride 0.9 % IVPB 500 mg (11/26/22 2010)   PRN Meds:.acetaminophen **OR** acetaminophen, ipratropium-albuterol, metoprolol tartrate, ondansetron **OR** ondansetron (ZOFRAN) IV, polyethylene glycol   I have personally reviewed following labs and imaging studies  LABORATORY DATA: CBC: Recent Labs  Lab 11/25/22 0936  WBC 13.9*  HGB 11.8*  HCT 35.7*  MCV 87.9  PLT 374      Basic Metabolic Panel: Recent Labs  Lab 11/23/22 0809 11/24/22 1020 11/25/22 0936  NA 135 134* 138  K 4.3 3.6 4.3  CL 102 100 103  CO2 24 25 25   GLUCOSE 101* 102* 120*  BUN 6 6 7   CREATININE 0.50 0.57 0.54  CALCIUM 7.7* 8.0* 8.4*     GFR: Estimated Creatinine Clearance: 80.6 mL/min (by C-G formula based on SCr of 0.54 mg/dL).  Liver Function Tests: No results for input(s): "AST", "ALT", "ALKPHOS", "BILITOT", "PROT", "ALBUMIN" in the last 168 hours.  No results for input(s): "LIPASE", "AMYLASE" in the last 168 hours. No results for input(s): "AMMONIA" in the last 168 hours.  Coagulation Profile: Recent Labs  Lab 11/24/22 1020  INR 1.1     Cardiac Enzymes: Recent Labs  Lab 11/23/22 1303  CKTOTAL 16*     BNP (last 3 results) No results for input(s): "PROBNP" in the last 8760 hours.  Lipid Profile: No results for input(s): "CHOL", "HDL", "LDLCALC", "TRIG", "CHOLHDL", "LDLDIRECT" in the last 72 hours.  Thyroid Function Tests: No results for input(s): "TSH", "T4TOTAL", "FREET4", "T3FREE", "THYROIDAB" in the last 72 hours.  Anemia Panel: No results for input(s): "VITAMINB12", "FOLATE", "FERRITIN", "TIBC", "IRON", "RETICCTPCT" in the last 72 hours.  Urine analysis:    Component Value Date/Time   COLORURINE YELLOW 11/18/2022 1020   APPEARANCEUR CLOUDY (A) 11/18/2022 1020   LABSPEC 1.009 11/18/2022 1020   PHURINE 6.0 11/18/2022 1020   GLUCOSEU NEGATIVE 11/18/2022 1020   HGBUR SMALL (A) 11/18/2022 1020   BILIRUBINUR NEGATIVE 11/18/2022 1020   KETONESUR NEGATIVE 11/18/2022 1020   PROTEINUR NEGATIVE 11/18/2022 1020   UROBILINOGEN 0.2 07/02/2015 1044   NITRITE POSITIVE (A) 11/18/2022 1020   LEUKOCYTESUR MODERATE (A) 11/18/2022 1020    Sepsis Labs: Lactic Acid, Venous    Component Value Date/Time   LATICACIDVEN 1.4 11/18/2022 1303    MICROBIOLOGY: Recent Results (from the past 240 hour(s))  Resp panel by RT-PCR (RSV, Flu A&B, Covid) Anterior  Nasal Swab     Status: None   Collection Time: 11/18/22 10:08 AM   Specimen: Anterior Nasal Swab  Result Value Ref Range Status   SARS Coronavirus 2 by RT PCR NEGATIVE NEGATIVE Final    Comment: (NOTE) SARS-CoV-2 target nucleic acids are NOT DETECTED.  The SARS-CoV-2 RNA is generally detectable in upper respiratory specimens during the acute phase of infection. The lowest concentration of SARS-CoV-2 viral copies this assay can detect is 138 copies/mL. A negative result does not preclude SARS-Cov-2 infection and should not be used as the sole basis for treatment or other patient management decisions. A negative result may occur with  improper specimen collection/handling, submission of specimen other than nasopharyngeal swab, presence of viral mutation(s) within the areas targeted by this assay, and inadequate number of viral copies(<138 copies/mL). A negative result must be combined with clinical observations, patient history, and epidemiological information. The expected result is Negative.  Fact Sheet for Patients:  EntrepreneurPulse.com.au  Fact Sheet for Healthcare Providers:  IncredibleEmployment.be  This test is no t yet approved or cleared by the [place] FDA and  has been authorized for detection and/or diagnosis of SARS-CoV-2 by FDA under an Emergency Use Authorization (EUA). This EUA will remain  in effect (meaning this test can be used) for the duration of the COVID-19 declaration under Section 564(b)(1) of the Act, 21 U.S.C.section 360bbb-3(b)(1), unless the authorization is terminated  or revoked sooner.       Influenza A by PCR NEGATIVE NEGATIVE Final   Influenza B by PCR NEGATIVE NEGATIVE Final    Comment: (NOTE) The Xpert Xpress SARS-CoV-2/FLU/RSV plus assay is intended as an aid in the diagnosis of influenza from Nasopharyngeal swab specimens and should not be used as a sole basis for treatment. Nasal washings  and aspirates are unacceptable for Xpert Xpress SARS-CoV-2/FLU/RSV testing.  Fact Sheet for Patients: EntrepreneurPulse.com.au  Fact Sheet for Healthcare Providers: IncredibleEmployment.be  This test is not yet approved or cleared by the [place] FDA and has been authorized for detection and/or diagnosis of SARS-CoV-2 by FDA under an Emergency Use Authorization (EUA). This EUA will remain in effect (meaning this test can be used) for the duration of the COVID-19 declaration under Section 564(b)(1) of the Act, 21 U.S.C. section 360bbb-3(b)(1), unless the authorization is terminated  or revoked.     Resp Syncytial Virus by PCR NEGATIVE NEGATIVE Final    Comment: (NOTE) Fact Sheet for Patients: BloggerCourse.com  Fact Sheet for Healthcare Providers: SeriousBroker.it  This test is not yet approved or cleared by the [place] FDA and has been authorized for detection and/or diagnosis of SARS-CoV-2 by FDA under an Emergency Use Authorization (EUA). This EUA will remain in effect (meaning this test can be used) for the duration of the COVID-19 declaration under Section 564(b)(1) of the Act, 21 U.S.C. section 360bbb-3(b)(1), unless the authorization is terminated or revoked.  Performed at [facility], [address], [place], [place] [address]   Blood culture (routine x 2)     Status: Abnormal   Collection Time: 11/18/22 12:07 PM   Specimen: BLOOD  Result Value Ref Range Status   Specimen Description BLOOD SITE NOT SPECIFIED  Final   Special Requests   Final    BOTTLES DRAWN AEROBIC AND ANAEROBIC Blood Culture adequate volume   Culture  Setup Time   Final    GRAM POSITIVE COCCI IN BOTH AEROBIC AND ANAEROBIC BOTTLES CRITICAL RESULT CALLED TO, READ BACK BY AND VERIFIED WITH: PHARMD G. ABBOTT 11/19/22 @ 338 BY AB Performed at [facility], [address], [place], [place]  [address]    Culture METHICILLIN RESISTANT STAPHYLOCOCCUS AUREUS (A)  Final   Report Status 11/21/2022 FINAL  Final   Organism ID, Bacteria METHICILLIN RESISTANT STAPHYLOCOCCUS AUREUS  Final      Susceptibility   Methicillin resistant staphylococcus aureus - MIC*    CIPROFLOXACIN >=8 RESISTANT Resistant     ERYTHROMYCIN >=8 RESISTANT Resistant     GENTAMICIN <=0.5 SENSITIVE Sensitive     OXACILLIN >=4 RESISTANT Resistant     TETRACYCLINE <=1 SENSITIVE Sensitive     VANCOMYCIN <=0.5 SENSITIVE Sensitive     TRIMETH/SULFA <=10 SENSITIVE Sensitive     CLINDAMYCIN >=8 RESISTANT Resistant     RIFAMPIN <=0.5 SENSITIVE Sensitive     Inducible Clindamycin NEGATIVE Sensitive     * METHICILLIN RESISTANT STAPHYLOCOCCUS AUREUS  Blood Culture ID Panel (Reflexed)     Status: Abnormal   Collection Time: 11/18/22 12:07 PM  Result Value Ref Range Status   Enterococcus faecalis NOT DETECTED NOT DETECTED Final   Enterococcus Faecium NOT DETECTED NOT DETECTED Final   Listeria monocytogenes NOT DETECTED NOT DETECTED Final   Staphylococcus species DETECTED (A) NOT DETECTED Final    Comment: CRITICAL RESULT CALLED TO, READ BACK BY AND VERIFIED WITH: PHARMD G. ABBOTT 11/19/22 @ 338 BY AB    Staphylococcus aureus (BCID) DETECTED (A) NOT DETECTED Final    Comment: Methicillin (oxacillin)-resistant Staphylococcus aureus (MRSA). MRSA is predictably resistant to beta-lactam antibiotics (except ceftaroline). Preferred therapy is vancomycin unless clinically contraindicated. Patient requires contact precautions if  hospitalized. CRITICAL RESULT CALLED TO, READ BACK BY AND VERIFIED WITH: PHARMD G. ABBOTT 11/19/22 @ 338 BY AB    Staphylococcus epidermidis NOT DETECTED NOT DETECTED Final   Staphylococcus lugdunensis NOT DETECTED NOT DETECTED Final   Streptococcus species NOT DETECTED NOT DETECTED Final   Streptococcus agalactiae NOT DETECTED NOT DETECTED Final   Streptococcus pneumoniae NOT DETECTED NOT DETECTED Final    Streptococcus pyogenes NOT DETECTED NOT DETECTED Final   A.calcoaceticus-baumannii NOT DETECTED NOT DETECTED Final   Bacteroides fragilis NOT DETECTED NOT DETECTED Final   Enterobacterales NOT DETECTED NOT DETECTED Final   Enterobacter cloacae complex NOT DETECTED NOT DETECTED Final   Escherichia coli NOT DETECTED NOT DETECTED  Final   Klebsiella aerogenes NOT DETECTED NOT DETECTED Final   Klebsiella oxytoca NOT DETECTED NOT DETECTED Final   Klebsiella pneumoniae NOT DETECTED NOT DETECTED Final   Proteus species NOT DETECTED NOT DETECTED Final   Salmonella species NOT DETECTED NOT DETECTED Final   Serratia marcescens NOT DETECTED NOT DETECTED Final   Haemophilus influenzae NOT DETECTED NOT DETECTED Final   Neisseria meningitidis NOT DETECTED NOT DETECTED Final   Pseudomonas aeruginosa NOT DETECTED NOT DETECTED Final   Stenotrophomonas maltophilia NOT DETECTED NOT DETECTED Final   Candida albicans NOT DETECTED NOT DETECTED Final   Candida auris NOT DETECTED NOT DETECTED Final   Candida glabrata NOT DETECTED NOT DETECTED Final   Candida krusei NOT DETECTED NOT DETECTED Final   Candida parapsilosis NOT DETECTED NOT DETECTED Final   Candida tropicalis NOT DETECTED NOT DETECTED Final   Cryptococcus neoformans/gattii NOT DETECTED NOT DETECTED Final   Meth resistant mecA/C and MREJ DETECTED (A) NOT DETECTED Final    Comment: CRITICAL RESULT CALLED TO, READ BACK BY AND VERIFIED WITH: PHARMD G. ABBOTT 11/19/22 @ 338 BY AB Performed at [facility], [address], [place], [place] [address]   Blood culture (routine x 2)     Status: None   Collection Time: 11/19/22  3:16 AM   Specimen: BLOOD  Result Value Ref Range Status   Specimen Description BLOOD BLOOD LEFT HAND  Final   Special Requests   Final    BOTTLES DRAWN AEROBIC AND ANAEROBIC Blood Culture adequate volume   Culture   Final    NO GROWTH 5 DAYS Performed at [facility], [place] [address] [place], [place] [address]     Report Status 11/24/2022 FINAL  Final  Culture, blood (Routine X 2) w Reflex to ID Panel     Status: Abnormal   Collection Time: 11/20/22  2:20 AM   Specimen: BLOOD LEFT HAND  Result Value Ref Range Status   Specimen Description BLOOD LEFT HAND  Final   Special Requests IN PEDIATRIC BOTTLE Blood Culture adequate volume  Final   Culture  Setup Time   Final    AEROBIC BOTTLE ONLY GRAM POSITIVE COCCI IN CLUSTERS CRITICAL VALUE NOTED.  VALUE IS CONSISTENT WITH PREVIOUSLY REPORTED AND CALLED VALUE.    Culture (A)  Final    STAPHYLOCOCCUS AUREUS SUSCEPTIBILITIES PERFORMED ON PREVIOUS CULTURE WITHIN THE LAST 5 DAYS. Performed at [facility], [place] [address] [place], [place] [address]    Report Status 11/21/2022 FINAL  Final  Culture, blood (Routine X 2) w Reflex to ID Panel     Status: Abnormal   Collection Time: 11/20/22  6:09 AM   Specimen: BLOOD LEFT HAND  Result Value Ref Range Status   Specimen Description BLOOD LEFT HAND  Final   Special Requests   Final    BOTTLES DRAWN AEROBIC ONLY Blood Culture results may not be optimal due to an inadequate volume of blood received in culture bottles   Culture  Setup Time   Final    AEROBIC BOTTLE ONLY GRAM POSITIVE COCCI IN CLUSTERS CRITICAL VALUE NOTED.  VALUE IS CONSISTENT WITH PREVIOUSLY REPORTED AND CALLED VALUE.    Culture (A)  Final    STAPHYLOCOCCUS AUREUS SUSCEPTIBILITIES PERFORMED ON PREVIOUS CULTURE WITHIN THE LAST 5 DAYS. Performed at [facility], [place] [address] [place], [place] [address]    Report Status 11/21/2022 FINAL  Final  Culture, blood (Routine X 2) w Reflex to ID Panel  Status: Abnormal   Collection Time: 11/21/22 10:04 AM   Specimen: BLOOD LEFT HAND  Result Value Ref Range Status   Specimen Description BLOOD LEFT HAND  Final   Special Requests   Final    BOTTLES DRAWN AEROBIC AND ANAEROBIC Blood Culture adequate volume   Culture  Setup Time   Final    GRAM POSITIVE COCCI IN BOTH AEROBIC AND  ANAEROBIC BOTTLES CRITICAL VALUE NOTED.  VALUE IS CONSISTENT WITH PREVIOUSLY REPORTED AND CALLED VALUE.    Culture (A)  Final    STAPHYLOCOCCUS AUREUS SUSCEPTIBILITIES PERFORMED ON PREVIOUS CULTURE WITHIN THE LAST 5 DAYS. Performed at [facility], [address] [place], [place] [address]    Report Status 11/24/2022 FINAL  Final  Culture, blood (Routine X 2) w Reflex to ID Panel     Status: Abnormal   Collection Time: 11/21/22 10:14 AM   Specimen: BLOOD  Result Value Ref Range Status   Specimen Description BLOOD RIGHT ANTECUBITAL  Final   Special Requests   Final    BOTTLES DRAWN AEROBIC ONLY Blood Culture adequate volume   Culture  Setup Time   Final    GRAM POSITIVE COCCI AEROBIC BOTTLE ONLY Gram Stain Report Called to,Read Back By and Verified With: PHARMD G. ABBOTT 11/21/22 @2321  BY AB    Culture (A)  Final    STAPHYLOCOCCUS AUREUS SUSCEPTIBILITIES PERFORMED ON PREVIOUS CULTURE WITHIN THE LAST 5 DAYS. Performed at [facility], [address] [place], [place] [address]    Report Status 11/24/2022 FINAL  Final  Culture, blood (Routine X 2) w Reflex to ID Panel     Status: Abnormal   Collection Time: 11/22/22 12:03 PM   Specimen: BLOOD LEFT HAND  Result Value Ref Range Status   Specimen Description BLOOD LEFT HAND  Final   Special Requests   Final    BOTTLES DRAWN AEROBIC AND ANAEROBIC Blood Culture adequate volume   Culture  Setup Time   Final    GRAM POSITIVE COCCI IN CLUSTERS ANAEROBIC BOTTLE ONLY CRITICAL VALUE NOTED.  VALUE IS CONSISTENT WITH PREVIOUSLY REPORTED AND CALLED VALUE.    Culture (A)  Final    STAPHYLOCOCCUS AUREUS SUSCEPTIBILITIES PERFORMED ON PREVIOUS CULTURE WITHIN THE LAST 5 DAYS. Performed at [facility], [address] [place], [place] [address]    Report Status 11/25/2022 FINAL  Final  Culture, blood (Routine X 2) w Reflex to ID Panel     Status: Abnormal   Collection Time: 11/22/22 12:24 PM   Specimen: BLOOD RIGHT HAND   Result Value Ref Range Status   Specimen Description BLOOD RIGHT HAND  Final   Special Requests   Final    BOTTLES DRAWN AEROBIC AND ANAEROBIC Blood Culture adequate volume   Culture  Setup Time   Final    GRAM POSITIVE COCCI IN CLUSTERS IN BOTH AEROBIC AND ANAEROBIC BOTTLES Gram Stain Report Called to,Read Back By and Verified With: PHARMD LEDFORD 11/23/22 @ 0512 BY AB    Culture (A)  Final    STAPHYLOCOCCUS AUREUS SUSCEPTIBILITIES PERFORMED ON PREVIOUS CULTURE WITHIN THE LAST 5 DAYS. Performed at [facility], [address] [place], [place] [address]    Report Status 11/25/2022 FINAL  Final  Culture, blood (Routine X 2) w Reflex to ID Panel     Status: None (Preliminary result)   Collection Time: 11/23/22  1:03 PM   Specimen: BLOOD LEFT ARM  Result Value Ref Range Status   Specimen  Description BLOOD LEFT ARM  Final   Special Requests   Final    BOTTLES DRAWN AEROBIC ONLY Blood Culture adequate volume   Culture   Final    NO GROWTH 4 DAYS Performed at [facility], [address], [place], [place] [address]    Report Status PENDING  Incomplete  Culture, blood (Routine X 2) w Reflex to ID Panel     Status: None (Preliminary result)   Collection Time: 11/23/22  1:04 PM   Specimen: BLOOD LEFT ARM  Result Value Ref Range Status   Specimen Description BLOOD LEFT ARM  Final   Special Requests   Final    BOTTLES DRAWN AEROBIC ONLY Blood Culture adequate volume   Culture   Final    NO GROWTH 4 DAYS Performed at [facility], [address] [place], [place] [address]    Report Status PENDING  Incomplete    RADIOLOGY STUDIES/RESULTS: No results found.   LOS: 9 days   Jeoffrey Massed, MD  [facility]    To contact the attending provider between 7A-7P or the covering provider during after hours 7P-7A, please log into the web site [email or web address] and access using universal  password for that web site. If you do not have the password,  please call the hospital operator.  11/27/2022, 10:24 AM

## 2022-11-28 LAB — CBC
HCT: 31.2 % — ABNORMAL LOW (ref 36.0–46.0)
Hemoglobin: 10.4 g/dL — ABNORMAL LOW (ref 12.0–15.0)
MCH: 29.7 pg (ref 26.0–34.0)
MCHC: 33.3 g/dL (ref 30.0–36.0)
MCV: 89.1 fL (ref 80.0–100.0)
Platelets: 481 10*3/uL — ABNORMAL HIGH (ref 150–400)
RBC: 3.5 MIL/uL — ABNORMAL LOW (ref 3.87–5.11)
RDW: 14.4 % (ref 11.5–15.5)
WBC: 12.5 10*3/uL — ABNORMAL HIGH (ref 4.0–10.5)
nRBC: 0 % (ref 0.0–0.2)

## 2022-11-28 LAB — CULTURE, BLOOD (ROUTINE X 2)
Culture: NO GROWTH
Culture: NO GROWTH
Special Requests: ADEQUATE
Special Requests: ADEQUATE

## 2022-11-28 LAB — BASIC METABOLIC PANEL
Anion gap: 7 (ref 5–15)
BUN: 9 mg/dL (ref 6–20)
CO2: 26 mmol/L (ref 22–32)
Calcium: 8.4 mg/dL — ABNORMAL LOW (ref 8.9–10.3)
Chloride: 99 mmol/L (ref 98–111)
Creatinine, Ser: 0.62 mg/dL (ref 0.44–1.00)
GFR, Estimated: >60 mL/min (ref >60)
Glucose, Bld: 101 mg/dL — ABNORMAL HIGH (ref 70–99)
Potassium: 4.1 mmol/L (ref 3.5–5.1)
Sodium: 132 mmol/L — ABNORMAL LOW (ref 135–145)

## 2022-11-28 NOTE — Progress Notes (Signed)
PROGRESS NOTE        PATIENT DETAILS Name: Cassandra Roberts Age: 32 y.o. Sex: female Date of Birth: 10-Jul-1991 Admit Date: 11/18/2022 Admitting Physician Marinda Elk, MD [id number], No Pcp Per  Brief Summary: Patient is a 32 y.o.  female with history of IVDA-recent hospitalization at [facility] for multifocal pneumonia-left AMA-presented with chest tightness/shortness of breath-she was found to have sepsis due to multifocal pneumonia-and subsequently blood cultures positive for MRSA.  Significant events: 1/11>> admit to [facility]-sepsis-multifocal PNA-blood cultures subsequently positive for MRSA.  Significant studies: 1/11>> CT chest: Multifocal lung consolidation-greatest in the left side. 1/12>> TTE: EF 70-75%-no obvious vegetation 1/15>> MRI left shoulder: Moderate ill-defined fluid between the deltoid, infraspinatus and teres minor muscles.  No well-defined fluid collection identified. 1/15>> MRI left femur: Motion degraded exam-nondiagnostic exam. 1/16>> MRI left hip: Mild to moderate fluid tracking along the anterior aspect of the left iliopsoas junction-nonspecific-may represent bursitis.  Significant microbiology data: 1/11>> COVID/influenza/RSV PCR: Negative 1/11>> blood culture: MRSA 1/13>> blood culture: MRSA 1/14>> blood culture: MRSA 1/15>> blood culture: MRSA 1/16>> blood culture: Negative  Procedures: 1/17>> TEE: No vegetation.  Consults: [facility] Orthoped[facility]  Subjective: Afebrile.  Denies hip pain today.  Pain in her right flank/chest area is also significantly better.  Objective: Vitals: Blood pressure 128/87, pulse 90, temperature 98.3 F (36.8 C), temperature source Oral, resp. rate 16, height 5\' 2"  (1.575 m), weight 56.7 kg, last menstrual period 11/04/2022, SpO2 96 %.   Exam: Gen Exam:Alert awake-not in any distress HEENT:atraumatic, normocephalic Chest: B/L clear to auscultation anteriorly CVS:S1S2  regular Abdomen:soft non tender, non distended Extremities:no edema Neurology: Non focal Skin: no rash  Pertinent Labs/Radiology:    Latest Ref Rng & Units 11/28/2022    7:28 AM 11/25/2022    9:36 AM 11/19/2022    3:16 AM  CBC  WBC 4.0 - 10.5 K/uL 12.5  13.9  15.6   Hemoglobin 12.0 - 15.0 g/dL 01/18/2023  78.2  95.6   Hematocrit 36.0 - 46.0 % 31.2  35.7  31.8   Platelets 150 - 400 K/uL 481  374  194     Lab Results  Component Value Date   NA 132 (L) 11/28/2022   K 4.1 11/28/2022   CL 99 11/28/2022   CO2 26 11/28/2022      Assessment/Plan: Sepsis secondary to MRSA bacteremia/septic emboli Sepsis physiology resolved Had persistent bacteremia initially-however blood cultures since 1/16 remain negative.  Persistent bacteremia most likely due to underdosing of vancomycin. Remains on Teflaro/daptomycin TEE negative Continue to monitor clinically-appears to be stabilizing-no longer with hip pain/right flank pain/chest pain.   Await further recommendations from infectious disease  History of polysubstance abuse/IVDU (UDS positive for cocaine/amphetamines) Counseled  Chronic HCV infection Outpatient management  BMI: Estimated body mass index is 22.86 kg/m as calculated from the following:   Height as of this encounter: 5\' 2"  (1.575 m).   Weight as of this encounter: 56.7 kg.   Code status:   Code Status: Full Code   DVT Prophylaxis: enoxaparin (LOVENOX) injection 40 mg Start: 11/18/22 1930   Family Communication: None at bedside   Disposition Plan: Status is: Inpatient Remains inpatient appropriate because: Severity of illness.   Planned Discharge Destination:Home   Diet: Diet Order             Diet regular Room service appropriate? Yes;  Fluid consistency: Thin  Diet effective now                     Antimicrobial agents: Anti-infectives (From admission, onward)    Start     Dose/Rate Route Frequency Ordered Stop   11/23/22 1400  DAPTOmycin (CUBICIN)  500 mg in sodium chloride 0.9 % IVPB        500 mg 120 mL/hr over 30 Minutes Intravenous Daily 11/23/22 0921     11/23/22 1000  ceftaroline (TEFLARO) 600 mg in sodium chloride 0.9 % 100 mL IVPB        600 mg 100 mL/hr over 60 Minutes Intravenous Every 8 hours 11/23/22 0921     11/22/22 1445  vancomycin (VANCOCIN) IVPB 1000 mg/200 mL premix  Status:  Discontinued        1,000 mg 200 mL/hr over 60 Minutes Intravenous Every 8 hours 11/22/22 1347 11/23/22 0921   11/19/22 0200  vancomycin (VANCOCIN) IVPB 750 mg/150 ml premix  Status:  Discontinued        750 mg 150 mL/hr over 60 Minutes Intravenous Every 12 hours 11/18/22 1354 11/19/22 0110   11/19/22 0200  vancomycin (VANCOREADY) IVPB 750 mg/150 mL  Status:  Discontinued        750 mg 150 mL/hr over 60 Minutes Intravenous Every 12 hours 11/19/22 0110 11/22/22 1347   11/19/22 0000  linezolid (ZYVOX) 600 MG tablet        600 mg Oral 2 times daily 11/19/22 1045 12/31/22 2359   11/18/22 1330  ceFEPIme (MAXIPIME) 2 g in sodium chloride 0.9 % 100 mL IVPB        2 g 200 mL/hr over 30 Minutes Intravenous  Once 11/18/22 1324 11/18/22 1408   11/18/22 1330  Vancomycin (VANCOCIN) 1,250 mg in sodium chloride 0.9 % 250 mL IVPB        1,250 mg 166.7 mL/hr over 90 Minutes Intravenous  Once 11/18/22 1329 11/18/22 1652        MEDICATIONS: Scheduled Meds:  enoxaparin (LOVENOX) injection  40 mg Subcutaneous Q24H   metoprolol tartrate  25 mg Oral BID   morphine  30 mg Oral Q12H   nicotine  14 mg Transdermal Daily   Continuous Infusions:  ceFTAROline (TEFLARO) IV 600 mg (11/28/22 0252)   DAPTOmycin (CUBICIN) 500 mg in sodium chloride 0.9 % IVPB 500 mg (11/27/22 2038)   PRN Meds:.acetaminophen **OR** acetaminophen, ipratropium-albuterol, metoprolol tartrate, ondansetron **OR** ondansetron (ZOFRAN) IV, polyethylene glycol   I have personally reviewed following labs and imaging studies  LABORATORY DATA: CBC: Recent Labs  Lab 11/25/22 0936  11/28/22 0728  WBC 13.9* 12.5*  HGB 11.8* 10.4*  HCT 35.7* 31.2*  MCV 87.9 89.1  PLT 374 481*     Basic Metabolic Panel: Recent Labs  Lab 11/23/22 0809 11/24/22 1020 11/25/22 0936 11/28/22 0728  NA 135 134* 138 132*  K 4.3 3.6 4.3 4.1  CL 102 100 103 99  CO2 24 25 25 26   GLUCOSE 101* 102* 120* 101*  BUN 6 6 7 9   CREATININE 0.50 0.57 0.54 0.62  CALCIUM 7.7* 8.0* 8.4* 8.4*     GFR: Estimated Creatinine Clearance: 80.6 mL/min (by C-G formula based on SCr of 0.62 mg/dL).  Liver Function Tests: No results for input(s): "AST", "ALT", "ALKPHOS", "BILITOT", "PROT", "ALBUMIN" in the last 168 hours.  No results for input(s): "LIPASE", "AMYLASE" in the last 168 hours. No results for input(s): "AMMONIA" in the last 168 hours.  Coagulation Profile: Recent  Labs  Lab 11/24/22 1020  INR 1.1     Cardiac Enzymes: Recent Labs  Lab 11/23/22 1303  CKTOTAL 16*     BNP (last 3 results) No results for input(s): "PROBNP" in the last 8760 hours.  Lipid Profile: No results for input(s): "CHOL", "HDL", "LDLCALC", "TRIG", "CHOLHDL", "LDLDIRECT" in the last 72 hours.  Thyroid Function Tests: No results for input(s): "TSH", "T4TOTAL", "FREET4", "T3FREE", "THYROIDAB" in the last 72 hours.  Anemia Panel: No results for input(s): "VITAMINB12", "FOLATE", "FERRITIN", "TIBC", "IRON", "RETICCTPCT" in the last 72 hours.  Urine analysis:    Component Value Date/Time   COLORURINE YELLOW 11/18/2022 1020   APPEARANCEUR CLOUDY (A) 11/18/2022 1020   LABSPEC 1.009 11/18/2022 1020   PHURINE 6.0 11/18/2022 1020   GLUCOSEU NEGATIVE 11/18/2022 1020   HGBUR SMALL (A) 11/18/2022 1020   BILIRUBINUR NEGATIVE 11/18/2022 1020   KETONESUR NEGATIVE 11/18/2022 1020   PROTEINUR NEGATIVE 11/18/2022 1020   UROBILINOGEN 0.2 07/02/2015 1044   NITRITE POSITIVE (A) 11/18/2022 1020   LEUKOCYTESUR MODERATE (A) 11/18/2022 1020    Sepsis Labs: Lactic Acid, Venous    Component Value Date/Time    LATICACIDVEN 1.4 11/18/2022 1303    MICROBIOLOGY: Recent Results (from the past 240 hour(s))  Resp panel by RT-PCR (RSV, Flu A&B, Covid) Anterior Nasal Swab     Status: None   Collection Time: 11/18/22 10:08 AM   Specimen: Anterior Nasal Swab  Result Value Ref Range Status   SARS Coronavirus 2 by RT PCR NEGATIVE NEGATIVE Final    Comment: (NOTE) SARS-CoV-2 target nucleic acids are NOT DETECTED.  The SARS-CoV-2 RNA is generally detectable in upper respiratory specimens during the acute phase of infection. The lowest concentration of SARS-CoV-2 viral copies this assay can detect is 138 copies/mL. A negative result does not preclude SARS-Cov-2 infection and should not be used as the sole basis for treatment or other patient management decisions. A negative result may occur with  improper specimen collection/handling, submission of specimen other than nasopharyngeal swab, presence of viral mutation(s) within the areas targeted by this assay, and inadequate number of viral copies(<138 copies/mL). A negative result must be combined with clinical observations, patient history, and epidemiological information. The expected result is Negative.  Fact Sheet for Patients:  BloggerCourse.com  Fact Sheet for Healthcare Providers:  SeriousBroker.it  This test is no t yet approved or cleared by the [place] FDA and  has been authorized for detection and/or diagnosis of SARS-CoV-2 by FDA under an Emergency Use Authorization (EUA). This EUA will remain  in effect (meaning this test can be used) for the duration of the COVID-19 declaration under Section 564(b)(1) of the Act, 21 U.S.C.section 360bbb-3(b)(1), unless the authorization is terminated  or revoked sooner.       Influenza A by PCR NEGATIVE NEGATIVE Final   Influenza B by PCR NEGATIVE NEGATIVE Final    Comment: (NOTE) The Xpert Xpress SARS-CoV-2/FLU/RSV plus assay is intended as  an aid in the diagnosis of influenza from Nasopharyngeal swab specimens and should not be used as a sole basis for treatment. Nasal washings and aspirates are unacceptable for Xpert Xpress SARS-CoV-2/FLU/RSV testing.  Fact Sheet for Patients: BloggerCourse.com  Fact Sheet for Healthcare Providers: SeriousBroker.it  This test is not yet approved or cleared by the [place] FDA and has been authorized for detection and/or diagnosis of SARS-CoV-2 by FDA under an Emergency Use Authorization (EUA). This EUA will remain in effect (meaning this test can be used) for the duration of the  COVID-19 declaration under Section 564(b)(1) of the Act, 21 U.S.C. section 360bbb-3(b)(1), unless the authorization is terminated or revoked.     Resp Syncytial Virus by PCR NEGATIVE NEGATIVE Final    Comment: (NOTE) Fact Sheet for Patients: EntrepreneurPulse.com.au  Fact Sheet for Healthcare Providers: IncredibleEmployment.be  This test is not yet approved or cleared by the [place] FDA and has been authorized for detection and/or diagnosis of SARS-CoV-2 by FDA under an Emergency Use Authorization (EUA). This EUA will remain in effect (meaning this test can be used) for the duration of the COVID-19 declaration under Section 564(b)(1) of the Act, 21 U.S.C. section 360bbb-3(b)(1), unless the authorization is terminated or revoked.  Performed at [facility], [place] [address] [place], [place] [address]   Blood culture (routine x 2)     Status: Abnormal   Collection Time: 11/18/22 12:07 PM   Specimen: BLOOD  Result Value Ref Range Status   Specimen Description BLOOD SITE NOT SPECIFIED  Final   Special Requests   Final    BOTTLES DRAWN AEROBIC AND ANAEROBIC Blood Culture adequate volume   Culture  Setup Time   Final    GRAM POSITIVE COCCI IN BOTH AEROBIC AND ANAEROBIC BOTTLES CRITICAL RESULT CALLED  TO, READ BACK BY AND VERIFIED WITH: PHARMD G. ABBOTT 11/19/22 @ 338 BY AB Performed at [facility], [place] [address] [place], [place] [address]    Culture METHICILLIN RESISTANT STAPHYLOCOCCUS AUREUS (A)  Final   Report Status 11/21/2022 FINAL  Final   Organism ID, Bacteria METHICILLIN RESISTANT STAPHYLOCOCCUS AUREUS  Final      Susceptibility   Methicillin resistant staphylococcus aureus - MIC*    CIPROFLOXACIN >=8 RESISTANT Resistant     ERYTHROMYCIN >=8 RESISTANT Resistant     GENTAMICIN <=0.5 SENSITIVE Sensitive     OXACILLIN >=4 RESISTANT Resistant     TETRACYCLINE <=1 SENSITIVE Sensitive     VANCOMYCIN <=0.5 SENSITIVE Sensitive     TRIMETH/SULFA <=10 SENSITIVE Sensitive     CLINDAMYCIN >=8 RESISTANT Resistant     RIFAMPIN <=0.5 SENSITIVE Sensitive     Inducible Clindamycin NEGATIVE Sensitive     * METHICILLIN RESISTANT STAPHYLOCOCCUS AUREUS  Blood Culture ID Panel (Reflexed)     Status: Abnormal   Collection Time: 11/18/22 12:07 PM  Result Value Ref Range Status   Enterococcus faecalis NOT DETECTED NOT DETECTED Final   Enterococcus Faecium NOT DETECTED NOT DETECTED Final   Listeria monocytogenes NOT DETECTED NOT DETECTED Final   Staphylococcus species DETECTED (A) NOT DETECTED Final    Comment: CRITICAL RESULT CALLED TO, READ BACK BY AND VERIFIED WITH: PHARMD G. ABBOTT 11/19/22 @ 338 BY AB    Staphylococcus aureus (BCID) DETECTED (A) NOT DETECTED Final    Comment: Methicillin (oxacillin)-resistant Staphylococcus aureus (MRSA). MRSA is predictably resistant to beta-lactam antibiotics (except ceftaroline). Preferred therapy is vancomycin unless clinically contraindicated. Patient requires contact precautions if  hospitalized. CRITICAL RESULT CALLED TO, READ BACK BY AND VERIFIED WITH: PHARMD G. ABBOTT 11/19/22 @ 338 BY AB    Staphylococcus epidermidis NOT DETECTED NOT DETECTED Final   Staphylococcus lugdunensis NOT DETECTED NOT DETECTED Final   Streptococcus species NOT  DETECTED NOT DETECTED Final   Streptococcus agalactiae NOT DETECTED NOT DETECTED Final   Streptococcus pneumoniae NOT DETECTED NOT DETECTED Final   Streptococcus pyogenes NOT DETECTED NOT DETECTED Final   A.calcoaceticus-baumannii NOT DETECTED NOT DETECTED Final   Bacteroides fragilis NOT DETECTED NOT DETECTED Final   Enterobacterales NOT DETECTED NOT DETECTED Final  Enterobacter cloacae complex NOT DETECTED NOT DETECTED Final   Escherichia coli NOT DETECTED NOT DETECTED Final   Klebsiella aerogenes NOT DETECTED NOT DETECTED Final   Klebsiella oxytoca NOT DETECTED NOT DETECTED Final   Klebsiella pneumoniae NOT DETECTED NOT DETECTED Final   Proteus species NOT DETECTED NOT DETECTED Final   Salmonella species NOT DETECTED NOT DETECTED Final   Serratia marcescens NOT DETECTED NOT DETECTED Final   Haemophilus influenzae NOT DETECTED NOT DETECTED Final   Neisseria meningitidis NOT DETECTED NOT DETECTED Final   Pseudomonas aeruginosa NOT DETECTED NOT DETECTED Final   Stenotrophomonas maltophilia NOT DETECTED NOT DETECTED Final   Candida albicans NOT DETECTED NOT DETECTED Final   Candida auris NOT DETECTED NOT DETECTED Final   Candida glabrata NOT DETECTED NOT DETECTED Final   Candida krusei NOT DETECTED NOT DETECTED Final   Candida parapsilosis NOT DETECTED NOT DETECTED Final   Candida tropicalis NOT DETECTED NOT DETECTED Final   Cryptococcus neoformans/gattii NOT DETECTED NOT DETECTED Final   Meth resistant mecA/C and MREJ DETECTED (A) NOT DETECTED Final    Comment: CRITICAL RESULT CALLED TO, READ BACK BY AND VERIFIED WITH: PHARMD G. ABBOTT 11/19/22 @ 338 BY AB Performed at [facility], [address], [place], [place] [address]   Blood culture (routine x 2)     Status: None   Collection Time: 11/19/22  3:16 AM   Specimen: BLOOD  Result Value Ref Range Status   Specimen Description BLOOD BLOOD LEFT HAND  Final   Special Requests   Final    BOTTLES DRAWN AEROBIC AND ANAEROBIC  Blood Culture adequate volume   Culture   Final    NO GROWTH 5 DAYS Performed at [facility], [place] [address], [place], [place] [address]    Report Status 11/24/2022 FINAL  Final  Culture, blood (Routine X 2) w Reflex to ID Panel     Status: Abnormal   Collection Time: 11/20/22  2:20 AM   Specimen: BLOOD LEFT HAND  Result Value Ref Range Status   Specimen Description BLOOD LEFT HAND  Final   Special Requests IN PEDIATRIC BOTTLE Blood Culture adequate volume  Final   Culture  Setup Time   Final    AEROBIC BOTTLE ONLY GRAM POSITIVE COCCI IN CLUSTERS CRITICAL VALUE NOTED.  VALUE IS CONSISTENT WITH PREVIOUSLY REPORTED AND CALLED VALUE.    Culture (A)  Final    STAPHYLOCOCCUS AUREUS SUSCEPTIBILITIES PERFORMED ON PREVIOUS CULTURE WITHIN THE LAST 5 DAYS. Performed at [facility], [place] [address] [place], [place] [address]    Report Status 11/21/2022 FINAL  Final  Culture, blood (Routine X 2) w Reflex to ID Panel     Status: Abnormal   Collection Time: 11/20/22  6:09 AM   Specimen: BLOOD LEFT HAND  Result Value Ref Range Status   Specimen Description BLOOD LEFT HAND  Final   Special Requests   Final    BOTTLES DRAWN AEROBIC ONLY Blood Culture results may not be optimal due to an inadequate volume of blood received in culture bottles   Culture  Setup Time   Final    AEROBIC BOTTLE ONLY GRAM POSITIVE COCCI IN CLUSTERS CRITICAL VALUE NOTED.  VALUE IS CONSISTENT WITH PREVIOUSLY REPORTED AND CALLED VALUE.    Culture (A)  Final    STAPHYLOCOCCUS AUREUS SUSCEPTIBILITIES PERFORMED ON PREVIOUS CULTURE WITHIN THE LAST 5 DAYS. Performed at [facility], [place] [address] [place], [place] [address]    Report Status  11/21/2022 FINAL  Final  Culture, blood (Routine X 2) w Reflex to ID Panel     Status: Abnormal   Collection Time: 11/21/22 10:04 AM   Specimen: BLOOD LEFT HAND  Result Value Ref Range Status   Specimen Description BLOOD LEFT HAND  Final   Special Requests    Final    BOTTLES DRAWN AEROBIC AND ANAEROBIC Blood Culture adequate volume   Culture  Setup Time   Final    GRAM POSITIVE COCCI IN BOTH AEROBIC AND ANAEROBIC BOTTLES CRITICAL VALUE NOTED.  VALUE IS CONSISTENT WITH PREVIOUSLY REPORTED AND CALLED VALUE.    Culture (A)  Final    STAPHYLOCOCCUS AUREUS SUSCEPTIBILITIES PERFORMED ON PREVIOUS CULTURE WITHIN THE LAST 5 DAYS. Performed at [facility], [address] [place], [place] [address]    Report Status 11/24/2022 FINAL  Final  Culture, blood (Routine X 2) w Reflex to ID Panel     Status: Abnormal   Collection Time: 11/21/22 10:14 AM   Specimen: BLOOD  Result Value Ref Range Status   Specimen Description BLOOD RIGHT ANTECUBITAL  Final   Special Requests   Final    BOTTLES DRAWN AEROBIC ONLY Blood Culture adequate volume   Culture  Setup Time   Final    GRAM POSITIVE COCCI AEROBIC BOTTLE ONLY Gram Stain Report Called to,Read Back By and Verified With: PHARMD G. ABBOTT 11/21/22 @2321  BY AB    Culture (A)  Final    STAPHYLOCOCCUS AUREUS SUSCEPTIBILITIES PERFORMED ON PREVIOUS CULTURE WITHIN THE LAST 5 DAYS. Performed at [facility], [address] [place], [place] [address]    Report Status 11/24/2022 FINAL  Final  Culture, blood (Routine X 2) w Reflex to ID Panel     Status: Abnormal   Collection Time: 11/22/22 12:03 PM   Specimen: BLOOD LEFT HAND  Result Value Ref Range Status   Specimen Description BLOOD LEFT HAND  Final   Special Requests   Final    BOTTLES DRAWN AEROBIC AND ANAEROBIC Blood Culture adequate volume   Culture  Setup Time   Final    GRAM POSITIVE COCCI IN CLUSTERS ANAEROBIC BOTTLE ONLY CRITICAL VALUE NOTED.  VALUE IS CONSISTENT WITH PREVIOUSLY REPORTED AND CALLED VALUE.    Culture (A)  Final    STAPHYLOCOCCUS AUREUS SUSCEPTIBILITIES PERFORMED ON PREVIOUS CULTURE WITHIN THE LAST 5 DAYS. Performed at [facility], [address] [place], [place] [address]    Report Status 11/25/2022  FINAL  Final  Culture, blood (Routine X 2) w Reflex to ID Panel     Status: Abnormal   Collection Time: 11/22/22 12:24 PM   Specimen: BLOOD RIGHT HAND  Result Value Ref Range Status   Specimen Description BLOOD RIGHT HAND  Final   Special Requests   Final    BOTTLES DRAWN AEROBIC AND ANAEROBIC Blood Culture adequate volume   Culture  Setup Time   Final    GRAM POSITIVE COCCI IN CLUSTERS IN BOTH AEROBIC AND ANAEROBIC BOTTLES Gram Stain Report Called to,Read Back By and Verified With: PHARMD LEDFORD 11/23/22 @ 0512 BY AB    Culture (A)  Final    STAPHYLOCOCCUS AUREUS SUSCEPTIBILITIES PERFORMED ON PREVIOUS CULTURE WITHIN THE LAST 5 DAYS. Performed at [facility], [address] [place], [place] [address]    Report Status 11/25/2022 FINAL  Final  Culture, blood (Routine X 2) w Reflex to ID Panel     Status: None   Collection Time: 11/23/22  1:03 PM   Specimen: BLOOD LEFT ARM  Result Value Ref Range Status   Specimen Description BLOOD LEFT ARM  Final   Special Requests   Final    BOTTLES DRAWN AEROBIC ONLY Blood Culture adequate volume   Culture   Final    NO GROWTH 5 DAYS Performed at [facility], [address] [place], [place] [address]    Report Status 11/28/2022 FINAL  Final  Culture, blood (Routine X 2) w Reflex to ID Panel     Status: None   Collection Time: 11/23/22  1:04 PM   Specimen: BLOOD LEFT ARM  Result Value Ref Range Status   Specimen Description BLOOD LEFT ARM  Final   Special Requests   Final    BOTTLES DRAWN AEROBIC ONLY Blood Culture adequate volume   Culture   Final    NO GROWTH 5 DAYS Performed at [facility], [address] [place], [place] [address]    Report Status 11/28/2022 FINAL  Final    RADIOLOGY STUDIES/RESULTS: No results found.   LOS: 10 days   Jeoffrey MassedShanker Christon Gallaway, MD  [facility]    To contact the attending provider between 7A-7P or the covering provider during after hours 7P-7A, please log into the web  site [email or web address] and access using universal [place] password for that web site. If you do not have the password, please call the hospital operator.  11/28/2022, 9:31 AM

## 2022-11-29 MED ORDER — VANCOMYCIN HCL IN DEXTROSE 1-5 GM/200ML-% IV SOLN
1000.0000 mg | Freq: Three times a day (TID) | INTRAVENOUS | Status: DC
  Administered 2022-11-30 – 2022-12-02 (×5): 1000 mg via INTRAVENOUS
  Filled 2022-11-29 (×6): qty 200

## 2022-11-29 NOTE — Progress Notes (Signed)
[facility] Infectious Diseases Follow Up Note  Patient Identification: Patient Name: Cassandra Roberts MRN: [id number] Admit Date: 11/18/2022  8:00 AM Age: 32 y.o.Today's Date: 11/29/2022  Reason for Visit: MRSA bacteremia   Principal Problem:   MRSA bacteremia Active Problems:   Polysubstance abuse (HCC)   IVDU (intravenous drug user)   CAP (community acquired pneumonia)   Septic embolism (HCC)   Sepsis (HCC)   Sinus tachycardia   Cellulitis of left foot   Multifocal pneumonia   Staphylococcal arthritis of left shoulder (HCC)   Chronic hepatitis C with hepatic coma (HCC)   Pyomyositis   Antibiotics: Vancomycin 1/11- 1/15, Daptomycin/ceftaroline combo 1/16-c Total days of abtx d 10  Lines/Hardwares: No known   Interval Events: afebrile, leukocytosis is downtrending  Assessment 32 year old female with history of IVDU and polysubstance abuse was initially seen at [facility] on 12/23 for multifocal pneumonia and presented to [facility] ED for worsening cough and left lower extremity cellulitis.  Admitted with  # MRSA bacteremia with possible pulmonary septic emboli with at least with cavity/Clinically native TV endocarditis  - no spinal tenderness at CTL spine  - 1/11 Blood cx  MRSA - 1/12 blood cx NG -1/13 blood cx 2/2 sets  MRSA - 1/14 blood cx 2/2 sets MRSA - 1/15 blood cx 2/2 sets MRSA - 1/16 blood cx 2/2 sets NG in 5 days  - 1/17 TEE negative for vegetations or endocarditis. Bubble Contrast Study: Positive, mild bubble crossover, small PFO    # Moderate ill-defined fluid posteriorly between the deltoid, infraspinatus and teres minor muscles/Possible posterior deltoid myositis Left shoulder - no symptoms at left shoulder whatsoever  # Fluid tracking along the left iliopsoas bursa, possible iliopsoas bursitis with associated multiple tiny abscesses  as well as multiple thin internal septations and loculations in between the rt  acetabulum and rt pelvic wall.  - Evaluated by orthopedics, no intervention recommended. Recommended to continue IV abtx. - No symptoms at left hip/thigh whatspever  # Hepatitis C - management OP  Recommendations Will switch Daptomycin/ceftaroline to Vancomycin monotherapy starting 1/23 Monitor CBC, BMP and Vancomycin trough  Will plan to re-image left hip given multiple abnormalities seen in MRI hip 1/16 concerning for infection  Patient will need at least 4 weeks of IV Vancomycin from date of negative blood cx 1/16 to be followed by dalbavancin vs PO linezolid to complete 6 weeks course  Following intermittently, please call with active questions   Rest of the management as per the primary team. Thank you for the consult. Please page with pertinent questions or concerns.  ______________________________________________________________________ Subjective Patient seen and examined at bedside.  No complaints. Denies any concerns at the left shoulder of left hip. Has mild pain at the bilateral ribs but denies SOB or cough   Vitals BP 109/63 (BP Location: Left Arm)   Pulse 74   Temp 98.3 F (36.8 C) (Oral)   Resp 18   Ht 5\' 2"  (1.575 m)   Wt 56.7 kg   LMP 11/04/2022   SpO2 95%   BMI 22.86 kg/m   Exam Young female lying in the bed and appears comfortable  HEENT - wnl, mucosa moist Heart and lung sounds WNL Skin - no rashes, tattoos+ MSK - no pedal edema, Left shoulder with no signs of septic joint, full ROM. Left hip with full ROM. No signs of septic joints in other peripheral joints.   Pertinent Microbiology Results for orders placed or performed during the hospital encounter of 11/18/22  Resp panel by RT-PCR (RSV, Flu A&B, Covid) Anterior Nasal Swab     Status: None   Collection Time: 11/18/22 10:08 AM   Specimen: Anterior Nasal Swab  Result Value Ref Range Status   SARS Coronavirus 2 by RT PCR NEGATIVE NEGATIVE Final    Comment: (NOTE) SARS-CoV-2 target nucleic acids are  NOT DETECTED.  The SARS-CoV-2 RNA is generally detectable in upper respiratory specimens during the acute phase of infection. The lowest concentration of SARS-CoV-2 viral copies this assay can detect is 138 copies/mL. A negative result does not preclude SARS-Cov-2 infection and should not be used as the sole basis for treatment or other patient management decisions. A negative result may occur with  improper specimen collection/handling, submission of specimen other than nasopharyngeal swab, presence of viral mutation(s) within the areas targeted by this assay, and inadequate number of viral copies(<138 copies/mL). A negative result must be combined with clinical observations, patient history, and epidemiological information. The expected result is Negative.  Fact Sheet for Patients:  EntrepreneurPulse.com.au  Fact Sheet for Healthcare Providers:  IncredibleEmployment.be  This test is no t yet approved or cleared by the [place] FDA and  has been authorized for detection and/or diagnosis of SARS-CoV-2 by FDA under an Emergency Use Authorization (EUA). This EUA will remain  in effect (meaning this test can be used) for the duration of the COVID-19 declaration under Section 564(b)(1) of the Act, 21 U.S.C.section 360bbb-3(b)(1), unless the authorization is terminated  or revoked sooner.       Influenza A by PCR NEGATIVE NEGATIVE Final   Influenza B by PCR NEGATIVE NEGATIVE Final    Comment: (NOTE) The Xpert Xpress SARS-CoV-2/FLU/RSV plus assay is intended as an aid in the diagnosis of influenza from Nasopharyngeal swab specimens and should not be used as a sole basis for treatment. Nasal washings and aspirates are unacceptable for Xpert Xpress SARS-CoV-2/FLU/RSV testing.  Fact Sheet for Patients: EntrepreneurPulse.com.au  Fact Sheet for Healthcare Providers: IncredibleEmployment.be  This test is not  yet approved or cleared by the [place] FDA and has been authorized for detection and/or diagnosis of SARS-CoV-2 by FDA under an Emergency Use Authorization (EUA). This EUA will remain in effect (meaning this test can be used) for the duration of the COVID-19 declaration under Section 564(b)(1) of the Act, 21 U.S.C. section 360bbb-3(b)(1), unless the authorization is terminated or revoked.     Resp Syncytial Virus by PCR NEGATIVE NEGATIVE Final    Comment: (NOTE) Fact Sheet for Patients: EntrepreneurPulse.com.au  Fact Sheet for Healthcare Providers: IncredibleEmployment.be  This test is not yet approved or cleared by the [place] FDA and has been authorized for detection and/or diagnosis of SARS-CoV-2 by FDA under an Emergency Use Authorization (EUA). This EUA will remain in effect (meaning this test can be used) for the duration of the COVID-19 declaration under Section 564(b)(1) of the Act, 21 U.S.C. section 360bbb-3(b)(1), unless the authorization is terminated or revoked.  Performed at [facility], [place] [address], [place],  [address]   Blood culture (routine x 2)     Status: Abnormal   Collection Time: 11/18/22 12:07 PM   Specimen: BLOOD  Result Value Ref Range Status   Specimen Description BLOOD SITE NOT SPECIFIED  Final   Special Requests   Final    BOTTLES DRAWN AEROBIC AND ANAEROBIC Blood Culture adequate volume   Culture  Setup Time   Final    GRAM POSITIVE COCCI IN BOTH AEROBIC AND ANAEROBIC BOTTLES CRITICAL RESULT  CALLED TO, READ BACK BY AND VERIFIED WITH: PHARMD G. ABBOTT 11/19/22 @ 338 BY AB Performed at [facility], [place] [address] [place], [place] [address]    Culture METHICILLIN RESISTANT STAPHYLOCOCCUS AUREUS (A)  Final   Report Status 11/21/2022 FINAL  Final   Organism ID, Bacteria METHICILLIN RESISTANT STAPHYLOCOCCUS AUREUS  Final      Susceptibility   Methicillin resistant staphylococcus  aureus - MIC*    CIPROFLOXACIN >=8 RESISTANT Resistant     ERYTHROMYCIN >=8 RESISTANT Resistant     GENTAMICIN <=0.5 SENSITIVE Sensitive     OXACILLIN >=4 RESISTANT Resistant     TETRACYCLINE <=1 SENSITIVE Sensitive     VANCOMYCIN <=0.5 SENSITIVE Sensitive     TRIMETH/SULFA <=10 SENSITIVE Sensitive     CLINDAMYCIN >=8 RESISTANT Resistant     RIFAMPIN <=0.5 SENSITIVE Sensitive     Inducible Clindamycin NEGATIVE Sensitive     * METHICILLIN RESISTANT STAPHYLOCOCCUS AUREUS  Blood Culture ID Panel (Reflexed)     Status: Abnormal   Collection Time: 11/18/22 12:07 PM  Result Value Ref Range Status   Enterococcus faecalis NOT DETECTED NOT DETECTED Final   Enterococcus Faecium NOT DETECTED NOT DETECTED Final   Listeria monocytogenes NOT DETECTED NOT DETECTED Final   Staphylococcus species DETECTED (A) NOT DETECTED Final    Comment: CRITICAL RESULT CALLED TO, READ BACK BY AND VERIFIED WITH: PHARMD G. ABBOTT 11/19/22 @ 338 BY AB    Staphylococcus aureus (BCID) DETECTED (A) NOT DETECTED Final    Comment: Methicillin (oxacillin)-resistant Staphylococcus aureus (MRSA). MRSA is predictably resistant to beta-lactam antibiotics (except ceftaroline). Preferred therapy is vancomycin unless clinically contraindicated. Patient requires contact precautions if  hospitalized. CRITICAL RESULT CALLED TO, READ BACK BY AND VERIFIED WITH: PHARMD G. ABBOTT 11/19/22 @ 338 BY AB    Staphylococcus epidermidis NOT DETECTED NOT DETECTED Final   Staphylococcus lugdunensis NOT DETECTED NOT DETECTED Final   Streptococcus species NOT DETECTED NOT DETECTED Final   Streptococcus agalactiae NOT DETECTED NOT DETECTED Final   Streptococcus pneumoniae NOT DETECTED NOT DETECTED Final   Streptococcus pyogenes NOT DETECTED NOT DETECTED Final   A.calcoaceticus-baumannii NOT DETECTED NOT DETECTED Final   Bacteroides fragilis NOT DETECTED NOT DETECTED Final   Enterobacterales NOT DETECTED NOT DETECTED Final   Enterobacter cloacae  complex NOT DETECTED NOT DETECTED Final   Escherichia coli NOT DETECTED NOT DETECTED Final   Klebsiella aerogenes NOT DETECTED NOT DETECTED Final   Klebsiella oxytoca NOT DETECTED NOT DETECTED Final   Klebsiella pneumoniae NOT DETECTED NOT DETECTED Final   Proteus species NOT DETECTED NOT DETECTED Final   Salmonella species NOT DETECTED NOT DETECTED Final   Serratia marcescens NOT DETECTED NOT DETECTED Final   Haemophilus influenzae NOT DETECTED NOT DETECTED Final   Neisseria meningitidis NOT DETECTED NOT DETECTED Final   Pseudomonas aeruginosa NOT DETECTED NOT DETECTED Final   Stenotrophomonas maltophilia NOT DETECTED NOT DETECTED Final   Candida albicans NOT DETECTED NOT DETECTED Final   Candida auris NOT DETECTED NOT DETECTED Final   Candida glabrata NOT DETECTED NOT DETECTED Final   Candida krusei NOT DETECTED NOT DETECTED Final   Candida parapsilosis NOT DETECTED NOT DETECTED Final   Candida tropicalis NOT DETECTED NOT DETECTED Final   Cryptococcus neoformans/gattii NOT DETECTED NOT DETECTED Final   Meth resistant mecA/C and MREJ DETECTED (A) NOT DETECTED Final    Comment: CRITICAL RESULT CALLED TO, READ BACK BY AND VERIFIED WITH: PHARMD G. ABBOTT 11/19/22 @ 338 BY AB Performed at [facility], [address] [place], [place] [address]   Blood culture (routine x 2)     Status: None   Collection Time: 11/19/22  3:16 AM   Specimen: BLOOD  Result Value Ref Range Status   Specimen Description BLOOD BLOOD LEFT HAND  Final   Special Requests   Final    BOTTLES DRAWN AEROBIC AND ANAEROBIC Blood Culture adequate volume   Culture   Final    NO GROWTH 5 DAYS Performed at [facility], [place] [address], [place], [place] [address]    Report Status 11/24/2022 FINAL  Final  Culture, blood (Routine X 2) w Reflex to ID Panel     Status: Abnormal   Collection Time: 11/20/22  2:20 AM   Specimen: BLOOD LEFT HAND  Result Value Ref Range Status   Specimen Description BLOOD LEFT HAND   Final   Special Requests IN PEDIATRIC BOTTLE Blood Culture adequate volume  Final   Culture  Setup Time   Final    AEROBIC BOTTLE ONLY GRAM POSITIVE COCCI IN CLUSTERS CRITICAL VALUE NOTED.  VALUE IS CONSISTENT WITH PREVIOUSLY REPORTED AND CALLED VALUE.    Culture (A)  Final    STAPHYLOCOCCUS AUREUS SUSCEPTIBILITIES PERFORMED ON PREVIOUS CULTURE WITHIN THE LAST 5 DAYS. Performed at [facility], [place] [address] [place], [place] [address]    Report Status 11/21/2022 FINAL  Final  Culture, blood (Routine X 2) w Reflex to ID Panel     Status: Abnormal   Collection Time: 11/20/22  6:09 AM   Specimen: BLOOD LEFT HAND  Result Value Ref Range Status   Specimen Description BLOOD LEFT HAND  Final   Special Requests   Final    BOTTLES DRAWN AEROBIC ONLY Blood Culture results may not be optimal due to an inadequate volume of blood received in culture bottles   Culture  Setup Time   Final    AEROBIC BOTTLE ONLY GRAM POSITIVE COCCI IN CLUSTERS CRITICAL VALUE NOTED.  VALUE IS CONSISTENT WITH PREVIOUSLY REPORTED AND CALLED VALUE.    Culture (A)  Final    STAPHYLOCOCCUS AUREUS SUSCEPTIBILITIES PERFORMED ON PREVIOUS CULTURE WITHIN THE LAST 5 DAYS. Performed at [facility], [place] [address] [place], [place] [address]    Report Status 11/21/2022 FINAL  Final  Culture, blood (Routine X 2) w Reflex to ID Panel     Status: Abnormal   Collection Time: 11/21/22 10:04 AM   Specimen: BLOOD LEFT HAND  Result Value Ref Range Status   Specimen Description BLOOD LEFT HAND  Final   Special Requests   Final    BOTTLES DRAWN AEROBIC AND ANAEROBIC Blood Culture adequate volume   Culture  Setup Time   Final    GRAM POSITIVE COCCI IN BOTH AEROBIC AND ANAEROBIC BOTTLES CRITICAL VALUE NOTED.  VALUE IS CONSISTENT WITH PREVIOUSLY REPORTED AND CALLED VALUE.    Culture (A)  Final    STAPHYLOCOCCUS AUREUS SUSCEPTIBILITIES PERFORMED ON PREVIOUS CULTURE WITHIN THE LAST 5 DAYS. Performed at [facility], [place] [address], [place], [place] [address]    Report Status 11/24/2022 FINAL  Final  Culture, blood (Routine X 2) w Reflex to ID Panel     Status: Abnormal   Collection Time: 11/21/22 10:14 AM   Specimen: BLOOD  Result Value Ref Range Status   Specimen Description BLOOD RIGHT ANTECUBITAL  Final   Special Requests   Final    BOTTLES DRAWN AEROBIC ONLY Blood Culture adequate volume   Culture  Setup Time   Final    GRAM POSITIVE COCCI AEROBIC BOTTLE ONLY Gram Stain Report Called to,Read Back By and Verified With: PHARMD G. ABBOTT 11/21/22 @2321  BY AB    Culture (A)  Final    STAPHYLOCOCCUS AUREUS SUSCEPTIBILITIES PERFORMED ON PREVIOUS CULTURE WITHIN THE LAST 5 DAYS. Performed at [facility], [place] [address] [place], [place] [address]    Report Status 11/24/2022 FINAL  Final  Culture, blood (Routine X 2) w Reflex to ID Panel     Status: Abnormal   Collection Time: 11/22/22 12:03 PM   Specimen: BLOOD LEFT HAND  Result Value Ref Range Status   Specimen Description BLOOD LEFT HAND  Final   Special Requests   Final    BOTTLES DRAWN AEROBIC AND ANAEROBIC Blood Culture adequate volume   Culture  Setup Time   Final    GRAM POSITIVE COCCI IN CLUSTERS ANAEROBIC BOTTLE ONLY CRITICAL VALUE NOTED.  VALUE IS CONSISTENT WITH PREVIOUSLY REPORTED AND CALLED VALUE.    Culture (A)  Final    STAPHYLOCOCCUS AUREUS SUSCEPTIBILITIES PERFORMED ON PREVIOUS CULTURE WITHIN THE LAST 5 DAYS. Performed at [facility], [place] [address] [place], [place] [address]    Report Status 11/25/2022 FINAL  Final  Culture, blood (Routine X 2) w Reflex to ID Panel     Status: Abnormal   Collection Time: 11/22/22 12:24 PM   Specimen: BLOOD RIGHT HAND  Result Value Ref Range Status   Specimen Description BLOOD RIGHT HAND  Final   Special Requests   Final    BOTTLES DRAWN AEROBIC AND ANAEROBIC Blood Culture adequate volume   Culture  Setup Time   Final    GRAM POSITIVE COCCI IN CLUSTERS IN BOTH  AEROBIC AND ANAEROBIC BOTTLES Gram Stain Report Called to,Read Back By and Verified With: PHARMD LEDFORD 11/23/22 @ 0512 BY AB    Culture (A)  Final    STAPHYLOCOCCUS AUREUS SUSCEPTIBILITIES PERFORMED ON PREVIOUS CULTURE WITHIN THE LAST 5 DAYS. Performed at [facility], [place] [address], [place], [place] [address]    Report Status 11/25/2022 FINAL  Final  Culture, blood (Routine X 2) w Reflex to ID Panel     Status: None   Collection Time: 11/23/22  1:03 PM   Specimen: BLOOD LEFT ARM  Result Value Ref Range Status   Specimen Description BLOOD LEFT ARM  Final   Special Requests   Final    BOTTLES DRAWN AEROBIC ONLY Blood Culture adequate volume   Culture   Final    NO GROWTH 5 DAYS Performed at [facility], [place] [address], [place], [place] [address]    Report Status 11/28/2022 FINAL  Final  Culture, blood (Routine X 2) w Reflex to ID Panel     Status: None   Collection Time: 11/23/22  1:04 PM   Specimen: BLOOD LEFT ARM  Result Value Ref Range Status   Specimen Description BLOOD LEFT ARM  Final   Special Requests   Final    BOTTLES DRAWN AEROBIC ONLY Blood Culture adequate volume   Culture   Final    NO GROWTH 5 DAYS Performed at [facility], [place] [address], [place],  [address]    Report Status 11/28/2022 FINAL  Final   Pertinent Lab.    Latest Ref Rng & Units 11/28/2022    7:28 AM 11/25/2022    9:36 AM 11/19/2022    3:16 AM  CBC  WBC 4.0 - 10.5  K/uL 12.5  13.9  15.6   Hemoglobin 12.0 - 15.0 g/dL 10.4  11.8  11.2   Hematocrit 36.0 - 46.0 % 31.2  35.7  31.8   Platelets 150 - 400 K/uL 481  374  194       Latest Ref Rng & Units 11/28/2022    7:28 AM 11/25/2022    9:36 AM 11/24/2022   10:20 AM  CMP  Glucose 70 - 99 mg/dL 101  120  102   BUN 6 - 20 mg/dL 9  7  6    Creatinine 0.44 - 1.00 mg/dL 0.62  0.54  0.57   Sodium 135 - 145 mmol/L 132  138  134   Potassium 3.5 - 5.1 mmol/L 4.1  4.3  3.6   Chloride 98 - 111 mmol/L 99  103  100   CO2 22 - 32  mmol/L 26  25  25    Calcium 8.9 - 10.3 mg/dL 8.4  8.4  8.0      Pertinent Imaging today Plain films and CT images have been personally visualized and interpreted; radiology reports have been reviewed. Decision making incorporated into the Impression / Recommendations.  No results found.  I spent 51  minutes for this patient encounter including review of prior medical records, coordination of care with primary/other specialist with greater than 50% of time being face to face/counseling and discussing diagnostics/treatment plan with the patient/family.  Electronically signed by:   Rosiland Oz, MD Infectious Disease Physician [facility] Pager: [phone]

## 2022-11-29 NOTE — Progress Notes (Signed)
PROGRESS NOTE        PATIENT DETAILS Name: Cassandra Roberts Age: 32 y.o. Sex: female Date of Birth: [birth year] Admit Date: 11/18/2022 Admitting Physician Marinda Elk, MD HUT:[id number], No Pcp Per  Brief Summary: Patient is a 32 y.o.  female with history of IVDA-recent hospitalization at [facility] for multifocal pneumonia-left AMA-presented with chest tightness/shortness of breath-she was found to have sepsis due to multifocal pneumonia-and subsequently blood cultures positive for MRSA.  Significant events: 1/11>> admit to [facility]-sepsis-multifocal PNA-blood cultures subsequently positive for MRSA.  Significant studies: 1/11>> CT chest: Multifocal lung consolidation-greatest in the left side. 1/12>> TTE: EF 70-75%-no obvious vegetation 1/15>> MRI left shoulder: Moderate ill-defined fluid between the deltoid, infraspinatus and teres minor muscles.  No well-defined fluid collection identified. 1/15>> MRI left femur: Motion degraded exam-nondiagnostic exam. 1/16>> MRI left hip: Mild to moderate fluid tracking along the anterior aspect of the left iliopsoas junction-nonspecific-may represent bursitis.  Significant microbiology data: 1/11>> COVID/influenza/RSV PCR: Negative 1/11>> blood culture: MRSA 1/13>> blood culture: MRSA 1/14>> blood culture: MRSA 1/15>> blood culture: MRSA 1/16>> blood culture: Negative  Procedures: 1/17>> TEE: No vegetation.  Consults: ID Orthopedics  Subjective: No major issues overnight-no pain in the hips.  Denies any flank/right chest pain.  Objective: Vitals: Blood pressure 109/63, pulse 74, temperature 98.3 F (36.8 C), temperature source Oral, resp. rate 18, height 5\' 2"  (1.575 m), weight 56.7 kg, last menstrual period 11/04/2022, SpO2 95 %.   Exam: Gen Exam:Alert awake-not in any distress HEENT:atraumatic, normocephalic Chest: B/L clear to auscultation anteriorly CVS:S1S2 regular Abdomen:soft  non tender, non distended Extremities:no edema Neurology: Non focal Skin: no rash  Pertinent Labs/Radiology:    Latest Ref Rng & Units 11/28/2022    7:28 AM 11/25/2022    9:36 AM 11/19/2022    3:16 AM  CBC  WBC 4.0 - 10.5 K/uL 12.5  13.9  15.6   Hemoglobin 12.0 - 15.0 g/dL 01/18/2023  54.6  56.8   Hematocrit 36.0 - 46.0 % 31.2  35.7  31.8   Platelets 150 - 400 K/uL 481  374  194     Lab Results  Component Value Date   NA 132 (L) 11/28/2022   K 4.1 11/28/2022   CL 99 11/28/2022   CO2 26 11/28/2022      Assessment/Plan: Sepsis secondary to MRSA bacteremia/septic emboli Sepsis physiology resolved Had persistent bacteremia initially-however blood cultures since 1/16 remain negative.  Persistent bacteremia most likely due to underdosing of vancomycin. Has been switched to Teflaro/daptomycin now.  TEE negative Continue to monitor clinically-appears to be stabilizing-no longer with hip pain/right flank pain/chest pain.   Await further recommendations from infectious disease  History of polysubstance abuse/IVDU (UDS positive for cocaine/amphetamines) Counseled  Chronic HCV infection Outpatient management  BMI: Estimated body mass index is 22.86 kg/m as calculated from the following:   Height as of this encounter: 5\' 2"  (1.575 m).   Weight as of this encounter: 56.7 kg.   Code status:   Code Status: Full Code   DVT Prophylaxis: enoxaparin (LOVENOX) injection 40 mg Start: 11/18/22 1930   Family Communication: None at bedside   Disposition Plan: Status is: Inpatient Remains inpatient appropriate because: Severity of illness.   Planned Discharge Destination:Home when she completes course of IV antibiotics.  Not a candidate for outpatient IV antibiotics.   Diet: Diet Order  Diet regular Room service appropriate? Yes; Fluid consistency: Thin  Diet effective now                     Antimicrobial agents: Anti-infectives (From admission, onward)     Start     Dose/Rate Route Frequency Ordered Stop   11/23/22 1400  DAPTOmycin (CUBICIN) 500 mg in sodium chloride 0.9 % IVPB        500 mg 120 mL/hr over 30 Minutes Intravenous Daily 11/23/22 0921     11/23/22 1000  ceftaroline (TEFLARO) 600 mg in sodium chloride 0.9 % 100 mL IVPB        600 mg 100 mL/hr over 60 Minutes Intravenous Every 8 hours 11/23/22 0921     11/22/22 1445  vancomycin (VANCOCIN) IVPB 1000 mg/200 mL premix  Status:  Discontinued        1,000 mg 200 mL/hr over 60 Minutes Intravenous Every 8 hours 11/22/22 1347 11/23/22 0921   11/19/22 0200  vancomycin (VANCOCIN) IVPB 750 mg/150 ml premix  Status:  Discontinued        750 mg 150 mL/hr over 60 Minutes Intravenous Every 12 hours 11/18/22 1354 11/19/22 0110   11/19/22 0200  vancomycin (VANCOREADY) IVPB 750 mg/150 mL  Status:  Discontinued        750 mg 150 mL/hr over 60 Minutes Intravenous Every 12 hours 11/19/22 0110 11/22/22 1347   11/19/22 0000  linezolid (ZYVOX) 600 MG tablet        600 mg Oral 2 times daily 11/19/22 1045 12/31/22 2359   11/18/22 1330  ceFEPIme (MAXIPIME) 2 g in sodium chloride 0.9 % 100 mL IVPB        2 g 200 mL/hr over 30 Minutes Intravenous  Once 11/18/22 1324 11/18/22 1408   11/18/22 1330  Vancomycin (VANCOCIN) 1,250 mg in sodium chloride 0.9 % 250 mL IVPB        1,250 mg 166.7 mL/hr over 90 Minutes Intravenous  Once 11/18/22 1329 11/18/22 1652        MEDICATIONS: Scheduled Meds:  enoxaparin (LOVENOX) injection  40 mg Subcutaneous Q24H   metoprolol tartrate  25 mg Oral BID   morphine  30 mg Oral Q12H   nicotine  14 mg Transdermal Daily   Continuous Infusions:  ceFTAROline (TEFLARO) IV 600 mg (11/29/22 1025)   DAPTOmycin (CUBICIN) 500 mg in sodium chloride 0.9 % IVPB 500 mg (11/28/22 2000)   PRN Meds:.acetaminophen **OR** acetaminophen, ipratropium-albuterol, metoprolol tartrate, ondansetron **OR** ondansetron (ZOFRAN) IV, polyethylene glycol   I have personally reviewed following labs  and imaging studies  LABORATORY DATA: CBC: Recent Labs  Lab 11/25/22 0936 11/28/22 0728  WBC 13.9* 12.5*  HGB 11.8* 10.4*  HCT 35.7* 31.2*  MCV 87.9 89.1  PLT 374 481*     Basic Metabolic Panel: Recent Labs  Lab 11/23/22 0809 11/24/22 1020 11/25/22 0936 11/28/22 0728  NA 135 134* 138 132*  K 4.3 3.6 4.3 4.1  CL 102 100 103 99  CO2 24 25 25 26   GLUCOSE 101* 102* 120* 101*  BUN 6 6 7 9   CREATININE 0.50 0.57 0.54 0.62  CALCIUM 7.7* 8.0* 8.4* 8.4*     GFR: Estimated Creatinine Clearance: 80.6 mL/min (by C-G formula based on SCr of 0.62 mg/dL).  Liver Function Tests: No results for input(s): "AST", "ALT", "ALKPHOS", "BILITOT", "PROT", "ALBUMIN" in the last 168 hours.  No results for input(s): "LIPASE", "AMYLASE" in the last 168 hours. No results for input(s): "AMMONIA" in the last  168 hours.  Coagulation Profile: Recent Labs  Lab 11/24/22 1020  INR 1.1     Cardiac Enzymes: Recent Labs  Lab 11/23/22 1303  CKTOTAL 16*     BNP (last 3 results) No results for input(s): "PROBNP" in the last 8760 hours.  Lipid Profile: No results for input(s): "CHOL", "HDL", "LDLCALC", "TRIG", "CHOLHDL", "LDLDIRECT" in the last 72 hours.  Thyroid Function Tests: No results for input(s): "TSH", "T4TOTAL", "FREET4", "T3FREE", "THYROIDAB" in the last 72 hours.  Anemia Panel: No results for input(s): "VITAMINB12", "FOLATE", "FERRITIN", "TIBC", "IRON", "RETICCTPCT" in the last 72 hours.  Urine analysis:    Component Value Date/Time   COLORURINE YELLOW 11/18/2022 1020   APPEARANCEUR CLOUDY (A) 11/18/2022 1020   LABSPEC 1.009 11/18/2022 1020   PHURINE 6.0 11/18/2022 1020   GLUCOSEU NEGATIVE 11/18/2022 1020   HGBUR SMALL (A) 11/18/2022 1020   BILIRUBINUR NEGATIVE 11/18/2022 1020   KETONESUR NEGATIVE 11/18/2022 1020   PROTEINUR NEGATIVE 11/18/2022 1020   UROBILINOGEN 0.2 07/02/2015 1044   NITRITE POSITIVE (A) 11/18/2022 1020   LEUKOCYTESUR MODERATE (A) 11/18/2022 1020     Sepsis Labs: Lactic Acid, Venous    Component Value Date/Time   LATICACIDVEN 1.4 11/18/2022 1303    MICROBIOLOGY: Recent Results (from the past 240 hour(s))  Culture, blood (Routine X 2) w Reflex to ID Panel     Status: Abnormal   Collection Time: 11/20/22  2:20 AM   Specimen: BLOOD LEFT HAND  Result Value Ref Range Status   Specimen Description BLOOD LEFT HAND  Final   Special Requests IN PEDIATRIC BOTTLE Blood Culture adequate volume  Final   Culture  Setup Time   Final    AEROBIC BOTTLE ONLY GRAM POSITIVE COCCI IN CLUSTERS CRITICAL VALUE NOTED.  VALUE IS CONSISTENT WITH PREVIOUSLY REPORTED AND CALLED VALUE.    Culture (A)  Final    STAPHYLOCOCCUS AUREUS SUSCEPTIBILITIES PERFORMED ON PREVIOUS CULTURE WITHIN THE LAST 5 DAYS. Performed at [facility], [place] [address], [place], [place] [address]    Report Status 11/21/2022 FINAL  Final  Culture, blood (Routine X 2) w Reflex to ID Panel     Status: Abnormal   Collection Time: 11/20/22  6:09 AM   Specimen: BLOOD LEFT HAND  Result Value Ref Range Status   Specimen Description BLOOD LEFT HAND  Final   Special Requests   Final    BOTTLES DRAWN AEROBIC ONLY Blood Culture results may not be optimal due to an inadequate volume of blood received in culture bottles   Culture  Setup Time   Final    AEROBIC BOTTLE ONLY GRAM POSITIVE COCCI IN CLUSTERS CRITICAL VALUE NOTED.  VALUE IS CONSISTENT WITH PREVIOUSLY REPORTED AND CALLED VALUE.    Culture (A)  Final    STAPHYLOCOCCUS AUREUS SUSCEPTIBILITIES PERFORMED ON PREVIOUS CULTURE WITHIN THE LAST 5 DAYS. Performed at [facility], [place] [address], [place], [place] [address]    Report Status 11/21/2022 FINAL  Final  Culture, blood (Routine X 2) w Reflex to ID Panel     Status: Abnormal   Collection Time: 11/21/22 10:04 AM   Specimen: BLOOD LEFT HAND  Result Value Ref Range Status   Specimen Description BLOOD LEFT HAND  Final   Special Requests   Final    BOTTLES DRAWN  AEROBIC AND ANAEROBIC Blood Culture adequate volume   Culture  Setup Time   Final    GRAM POSITIVE COCCI IN BOTH AEROBIC AND ANAEROBIC BOTTLES CRITICAL VALUE NOTED.  VALUE  IS CONSISTENT WITH PREVIOUSLY REPORTED AND CALLED VALUE.    Culture (A)  Final    STAPHYLOCOCCUS AUREUS SUSCEPTIBILITIES PERFORMED ON PREVIOUS CULTURE WITHIN THE LAST 5 DAYS. Performed at [facility], [address], [place], [place] [address]    Report Status 11/24/2022 FINAL  Final  Culture, blood (Routine X 2) w Reflex to ID Panel     Status: Abnormal   Collection Time: 11/21/22 10:14 AM   Specimen: BLOOD  Result Value Ref Range Status   Specimen Description BLOOD RIGHT ANTECUBITAL  Final   Special Requests   Final    BOTTLES DRAWN AEROBIC ONLY Blood Culture adequate volume   Culture  Setup Time   Final    GRAM POSITIVE COCCI AEROBIC BOTTLE ONLY Gram Stain Report Called to,Read Back By and Verified With: PHARMD G. ABBOTT 11/21/22 @2321  BY AB    Culture (A)  Final    STAPHYLOCOCCUS AUREUS SUSCEPTIBILITIES PERFORMED ON PREVIOUS CULTURE WITHIN THE LAST 5 DAYS. Performed at [facility], [address] [place], [place]    Report Status 11/24/2022 FINAL  Final  Culture, blood (Routine X 2) w Reflex to ID Panel     Status: Abnormal   Collection Time: 11/22/22 12:03 PM   Specimen: BLOOD LEFT HAND  Result Value Ref Range Status   Specimen Description BLOOD LEFT HAND  Final   Special Requests   Final    BOTTLES DRAWN AEROBIC AND ANAEROBIC Blood Culture adequate volume   Culture  Setup Time   Final    GRAM POSITIVE COCCI IN CLUSTERS ANAEROBIC BOTTLE ONLY CRITICAL VALUE NOTED.  VALUE IS CONSISTENT WITH PREVIOUSLY REPORTED AND CALLED VALUE.    Culture (A)  Final    STAPHYLOCOCCUS AUREUS SUSCEPTIBILITIES PERFORMED ON PREVIOUS CULTURE WITHIN THE LAST 5 DAYS. Performed at [facility], [address] [place], [place]    Report Status 11/25/2022 FINAL  Final  Culture,  blood (Routine X 2) w Reflex to ID Panel     Status: Abnormal   Collection Time: 11/22/22 12:24 PM   Specimen: BLOOD RIGHT HAND  Result Value Ref Range Status   Specimen Description BLOOD RIGHT HAND  Final   Special Requests   Final    BOTTLES DRAWN AEROBIC AND ANAEROBIC Blood Culture adequate volume   Culture  Setup Time   Final    GRAM POSITIVE COCCI IN CLUSTERS IN BOTH AEROBIC AND ANAEROBIC BOTTLES Gram Stain Report Called to,Read Back By and Verified With: PHARMD LEDFORD 11/23/22 @ 0512 BY AB    Culture (A)  Final    STAPHYLOCOCCUS AUREUS SUSCEPTIBILITIES PERFORMED ON PREVIOUS CULTURE WITHIN THE LAST 5 DAYS. Performed at [facility], [address], [place], [place]    Report Status 11/25/2022 FINAL  Final  Culture, blood (Routine X 2) w Reflex to ID Panel     Status: None   Collection Time: 11/23/22  1:03 PM   Specimen: BLOOD LEFT ARM  Result Value Ref Range Status   Specimen Description BLOOD LEFT ARM  Final   Special Requests   Final    BOTTLES DRAWN AEROBIC ONLY Blood Culture adequate volume   Culture   Final    NO GROWTH 5 DAYS Performed at [facility], [address] [place], [place]    Report Status 11/28/2022 FINAL  Final  Culture, blood (Routine X 2) w Reflex to ID Panel     Status: None  Collection Time: 11/23/22  1:04 PM   Specimen: BLOOD LEFT ARM  Result Value Ref Range Status   Specimen Description BLOOD LEFT ARM  Final   Special Requests   Final    BOTTLES DRAWN AEROBIC ONLY Blood Culture adequate volume   Culture   Final    NO GROWTH 5 DAYS Performed at [facility], [address], [place], [place] [address]    Report Status 11/28/2022 FINAL  Final    RADIOLOGY STUDIES/RESULTS: No results found.   LOS: 11 days   Jeoffrey Massed, MD  [facility]    To contact the attending provider between 7A-7P or the covering provider during after hours 7P-7A, please log into the web site [email or web address] and  access using universal [place] password for that web site. If you do not have the password, please call the hospital operator.  11/29/2022, 10:28 AM

## 2022-11-29 NOTE — TOC Progression Note (Signed)
Transition of Care [facility]) - Progression Note    Patient Details  Name: Cassandra Roberts MRN: [id number] Date of Birth: [birth year]  [facility]) CM/SW Contact  Levonne Lapping, RN Phone Number: 11/29/2022, 4:15 PM  Clinical Narrative:     Chart reviewed. Patient will remain here until IV ABX have completed       Barriers to Discharge: Inadequate or no insurance  Patient not a candidate for home IV ABX   Expected Discharge Plan and Services       Living arrangements for the past 2 months: Single Family Home                                       Social Determinants of Health (SDOH) Interventions SDOH Screenings   Food Insecurity: No Food Insecurity (11/18/2022)  Housing: Low Risk  (11/18/2022)  Transportation Needs: No Transportation Needs (11/18/2022)  Utilities: Not At Risk (11/18/2022)  Tobacco Use: High Risk (11/27/2022)    Readmission Risk Interventions     No data to display

## 2022-11-29 NOTE — TOC Transition Note (Signed)
Discharge medications (1) are being stored in the Transitions of Care (TOC) Pharmacy on the second floor until patient is ready for discharge.    

## 2022-11-29 NOTE — Progress Notes (Signed)
Pharmacy Antibiotic Note  Cassandra Roberts is a 32 y.o. female admitted on 11/18/2022 with MRSA bacteremia, concern for pulmonary septic emboli, and disseminated infection. Pharmacy has been consulted for Vancomycin dosing.   She completed a course of daptomycin + ceftaroline on 1/22 due to persistently positive blood cultures, however this was prior to the vancomycin dosing being optimized. Since that time (1/16), patient's blood cultures have been clear. Levels checked 1/16 resulted as 26 (Vp)/14 (Vr) >> AUC 519 on 1g/8h (SCr range 0.5-0.6). Patient's renal function has remained stable, thus will resume this dosing strategy. Load calculated as the same as maintenance dose.   Plan: - STOP daptomycin and ceftaroline following 1/22 doses  - START Vancomycin 1,000 mg IV Q8H (eAUC ~519)- no load, starting 1/23 1600 - Monitor renal function, PRN levels, ID recs    Height: 5\' 2"  (157.5 cm) Weight: 56.7 kg (125 lb) IBW/kg (Calculated) : 50.1  Temp (24hrs), Avg:98.3 F (36.8 C), Min:98.3 F (36.8 C), Max:98.3 F (36.8 C)  Recent Labs  Lab 11/23/22 0809 11/23/22 1303 11/24/22 1020 11/25/22 0936 11/28/22 0728  WBC  --   --   --  13.9* 12.5*  CREATININE 0.50  --  0.57 0.54 0.62  VANCOPEAK 26*  --   --   --   --   VANCORANDOM  --  14  --   --   --      Estimated Creatinine Clearance: 80.6 mL/min (by C-G formula based on SCr of 0.62 mg/dL).    Allergies  Allergen Reactions   Dextromethorphan     Shaking    Antimicrobials this admission: Cefepime 1/11 x 1 Vanc 1/11 >>1/16; 1/23>> Daptomycin 1/16>>1/22 Ceftaroline 1/16>>1/22   Dose adjustments this admission: *1/16 levels 26/14 >> AUC 519 on 1g/8h (SCr range 0.5-0.6)   Microbiology results: 1/11 BCx >> 1/2 MRSA 1/12 BCx >> ngx3d 1/13 BCx 2/2 MRSA 1/14 BCx >> 3/3 MRSA 1/15 BCx >>MRSA (drawn prior to vanc dose optimization) 1/16 Bcx: negF   Thank you for allowing pharmacy to be a part of this patient's care.  Adria Dill,  PharmD PGY-2 Infectious Diseases Resident  11/29/2022 4:50 PM

## 2022-11-30 ENCOUNTER — Inpatient Hospital Stay: Payer: Self-pay

## 2022-11-30 LAB — CBC
HCT: 32.9 % — ABNORMAL LOW (ref 36.0–46.0)
Hemoglobin: 10.8 g/dL — ABNORMAL LOW (ref 12.0–15.0)
MCH: 29.9 pg (ref 26.0–34.0)
MCHC: 32.8 g/dL (ref 30.0–36.0)
MCV: 91.1 fL (ref 80.0–100.0)
Platelets: 573 K/uL — ABNORMAL HIGH (ref 150–400)
RBC: 3.61 MIL/uL — ABNORMAL LOW (ref 3.87–5.11)
RDW: 13.9 % (ref 11.5–15.5)
WBC: 13.1 K/uL — ABNORMAL HIGH (ref 4.0–10.5)
nRBC: 0 % (ref 0.0–0.2)

## 2022-11-30 LAB — BASIC METABOLIC PANEL WITH GFR
Anion gap: 11 (ref 5–15)
BUN: 11 mg/dL (ref 6–20)
CO2: 25 mmol/L (ref 22–32)
Calcium: 8.8 mg/dL — ABNORMAL LOW (ref 8.9–10.3)
Chloride: 97 mmol/L — ABNORMAL LOW (ref 98–111)
Creatinine, Ser: 0.67 mg/dL (ref 0.44–1.00)
GFR, Estimated: >60 mL/min
Glucose, Bld: 132 mg/dL — ABNORMAL HIGH (ref 70–99)
Potassium: 4.4 mmol/L (ref 3.5–5.1)
Sodium: 133 mmol/L — ABNORMAL LOW (ref 135–145)

## 2022-11-30 LAB — CK: Total CK: 33 U/L — ABNORMAL LOW (ref 38–234)

## 2022-11-30 MED ORDER — NALOXONE HCL 0.4 MG/ML IJ SOLN
INTRAMUSCULAR | Status: AC
  Administered 2022-11-30: 0.1 mg via INTRAVENOUS
  Filled 2022-11-30: qty 1

## 2022-11-30 MED ORDER — NALOXONE HCL 0.4 MG/ML IJ SOLN: 0.4000 mg | INTRAMUSCULAR | Status: DC | PRN

## 2022-11-30 NOTE — Progress Notes (Signed)
Informed around 1710 to call rapid response on this pt by primary nurse Juliette Mangle. Upon assessment, pt was kussmaul breathing, eyes pinpoint, gray in color, and diaphoretic. Pt was unresponsive initially to voice and pain. MD notified immediately and ordered to give narcan. Rapid response arrived and pt began to response to questions with slurred speech and pupils still pinpoint. MD arrived to bedside. Asked pt if she give herself any drugs. Pt denied initially then stated she held onto the oxycontin that was given to her earlier in shift by [employer]. Anderson Malta RN stated she watched take all of her medications this am. Pt gave permission for her room to be searched for recreational drugs. During search, a small tin can was found but pt quickly snatched it and put it in her underwear and threatened to leave AMA, then pushed thru nursing staff into the restroom and locked the door. Pt denied taking anymore drugs while in the restroom. She claimed she flushed the drugs down to toilet, this was unwitnessed by staff. Staff searched bathroom but no drugs found. Multiple used saline syringes noted in pt's pocketbook. Pt stated she was on flushing her IV to help out nurses. Educated pt not to do that anymore. MD made aware and requested that pt be tripled X'ed, room relocated and for no visitors anymore.

## 2022-11-30 NOTE — Significant Event (Signed)
Rapid Response Event Note   Reason for Call :  Decreased LOC  Initial Focused Assessment:  Upon my arrival she is lying back in bed, breathing shallow.  She does arouse to touch but is still very drowsy.  Her pupils are pinpoint.  BP 145/96  HR 110-130  O2 sat 91% on RA  Lung sounds clear, heart tones regular   Interventions:  Narcan given IV.  Patient firmly asked that we stop giving her the medication after about 0.1mg  had been given. At this time she was mildly drowsy and conversant. Pupils are 3 and briskly reactive.  Dr Sloan Leiter at bedside to assess patient.  She gave [place] permission to search her room.  Multiple empty syringes were found and disposed of.  She pulled something quickly from her bag and hid it.  Then quickly went the bathroom.  She states that she disposed of "it".  No change in behavior  afterward.    Plan of Care:  Rn to call if patient becomes somnolent again.  See MD note for additional plan of care   Event Summary:   MD Notified: Ghimire Call Time: [place] Time: 1700 End Time: 1601  Raliegh Ip, RN

## 2022-11-30 NOTE — Progress Notes (Signed)
Arrived to room to place PICC.  Rapid response and MD at bedside.  Will follow up later.

## 2022-11-30 NOTE — Progress Notes (Signed)
Okay to place PICC per Dr. Manandhar with infectious disease.   

## 2022-11-30 NOTE — TOC Progression Note (Signed)
Transition of Care [facility]) - Progression Note    Patient Details  Name: Cassandra Roberts MRN: [id number] Date of Birth: 11-Dec-1990  [facility] CM/SW Contact  Levonne Lapping, RN Phone Number: 11/30/2022, 3:48 PM  Clinical Narrative:     Met with Patient bedside to discuss plan for DC after IVABX infusion (2/16)  Patient states she was living with her parents but that will not be an option after discharge. CM asked  what an alternative plan was and she stated that she may go to [place], [place] to live with her Aunt. She stated that she has 2 Aunts in [place] and they have let her know that will but her a ticket to fly there. Patient  decision pending.   TOC will continue to follow patient for any additional discharge needs          Expected Discharge Plan: Home/Self Care Barriers to Discharge: Continued Medical Work up, Active Substance Use with PICC Line  Expected Discharge Plan and Services In-house Referral: NA Discharge Planning Services: NA Post Acute Care Choice: NA Living arrangements for the past 2 months: Single Family Home                 DME Arranged: N/A DME Agency: NA       HH Arranged:  (NONE) [place] Agency: NA         Social Determinants of Health (SDOH) Interventions SDOH Screenings   Food Insecurity: No Food Insecurity (11/18/2022)  Housing: Low Risk  (11/18/2022)  Transportation Needs: No Transportation Needs (11/18/2022)  Utilities: Not At Risk (11/18/2022)  Tobacco Use: High Risk (11/27/2022)    Readmission Risk Interventions     No data to display

## 2022-11-30 NOTE — Progress Notes (Signed)
Pt found in bed to be unresponsive to voice and pain with shallow breathing, pupils pinpoint. Informed charge nurse Desiray to call a rapid response. MD notified and ordered narcan. Pt HR in the 120s and O2 in the low 90s on RA, BP 145/96.  Rapid response team arrived and pt was able to be aroused but still lethargic, pupils pinpoint. IV narcan given but pt stated she did not want any more of the medication after receiving about 0.25mg . MD arrived to assess pt. Pt more alert and able to answer questions with slurred speech. Pt stated she had saved her pain medication this nurse had given her this morning. This nurse watched her take her pain medication this morning. Pt consented to a room search and quickly hid an object from her bag. Staff asked to see the object but pt firmly declined and went into the bathroom. Pt stated she disposed of the object. Pt bathroom searched but nothing found. Multiple empty syringes found in pt bag and discarded. MD notified and placing orders.

## 2022-11-30 NOTE — Progress Notes (Signed)
Informed by RN that patient was lethargic-pinpoint pupils. Given her history of drug use-and her being on narcotics currently-Narcan was ordered ASAP. By the time I arrived on the floor-Narcan had just been given-patient was already much more awake/alert.  She is obviously still a bit drowsy-and was slurring some words. Claims that she saved her OxyContin-and took it this evening-and she should have not done it.   With her consent-her room was searched-per nursing staff-they found a suspicious object which the patient grabbed and went to the bathroom and claims that she flushed it down the toilet.  I had a frank discussion with the patient-explained that if she is not taking her medications in a supervised manner-and that she is spuriously using other illicit substances while getting narcotics in the hospital-it is not a safe situation.  For now-she is aware that we will be holding all narcotics.  Plan would be to monitor the patient for now Hold off on placing a PICC line Stop all narcotics Once we are sure of her safer environment-we could potentially restart narcotics-will talk to her about possibly doing Suboxone etc She starts having withdrawal symptoms-will start opioid withdrawal protocol.

## 2022-11-30 NOTE — Progress Notes (Signed)
PROGRESS NOTE        PATIENT DETAILS Name: Cassandra Roberts Age: 32 y.o. Sex: female Date of Birth: [birth year] Admit Date: 11/18/2022 Admitting Physician Marinda Elk, MD JGO:[id number], No Pcp Per  Brief Summary: Patient is a 32 y.o.  female with history of IVDA-recent hospitalization at [facility] for multifocal pneumonia-left AMA-presented with chest tightness/shortness of breath-she was found to have sepsis due to multifocal pneumonia-and subsequently blood cultures positive for MRSA.  Significant events: 1/11>> admit to [facility]-sepsis-multifocal PNA-blood cultures subsequently positive for MRSA.  Significant studies: 1/11>> CT chest: Multifocal lung consolidation-greatest in the left side. 1/12>> TTE: EF 70-75%-no obvious vegetation 1/15>> MRI left shoulder: Moderate ill-defined fluid between the deltoid, infraspinatus and teres minor muscles.  No well-defined fluid collection identified. 1/15>> MRI left femur: Motion degraded exam-nondiagnostic exam. 1/16>> MRI left hip: Mild to moderate fluid tracking along the anterior aspect of the left iliopsoas junction-nonspecific-may represent bursitis.  Significant microbiology data: 1/11>> COVID/influenza/RSV PCR: Negative 1/11>> blood culture: MRSA 1/13>> blood culture: MRSA 1/14>> blood culture: MRSA 1/15>> blood culture: MRSA 1/16>> blood culture: Negative  Procedures: 1/17>> TEE: No vegetation.  Consults: ID Orthopedics  Subjective: Lying comfortably in bed-no major issues overnight.  No pain in the right chest/right flank area and bilateral hips.  Objective: Vitals: Blood pressure 98/62, pulse 79, temperature 98.2 F (36.8 C), temperature source Oral, resp. rate 20, height 5\' 2"  (1.575 m), weight 56.7 kg, last menstrual period 11/04/2022, SpO2 94 %.   Exam: Gen Exam:Alert awake-not in any distress HEENT:atraumatic, normocephalic Chest: B/L clear to auscultation  anteriorly CVS:S1S2 regular Abdomen:soft non tender, non distended Extremities:no edema Neurology: Non focal Skin: no rash  Pertinent Labs/Radiology:    Latest Ref Rng & Units 11/28/2022    7:28 AM 11/25/2022    9:36 AM 11/19/2022    3:16 AM  CBC  WBC 4.0 - 10.5 K/uL 12.5  13.9  15.6   Hemoglobin 12.0 - 15.0 g/dL 01/18/2023  03.5  59.7   Hematocrit 36.0 - 46.0 % 31.2  35.7  31.8   Platelets 150 - 400 K/uL 481  374  194     Lab Results  Component Value Date   NA 132 (L) 11/28/2022   K 4.1 11/28/2022   CL 99 11/28/2022   CO2 26 11/28/2022      Assessment/Plan: Sepsis secondary to MRSA bacteremia/septic emboli Sepsis physiology resolved Had persistent bacteremia initially-however blood cultures since 1/16 remain negative.  Persistent bacteremia most likely due to underdosing of vancomycin. Was switched to Teflaro/daptomycin-ID planning to switch back to vancomycin beginning 1/23. TEE negative Continue to monitor clinically-appears to be stabilizing-no longer with hip pain/right flank pain/chest pain.   ID recommending 4 weeks of IV antibiotics from 1/16. She is not a candidate for outpatient IV antibiotics and will need to remain inpatient to complete 4 weeks of treatment.  History of polysubstance abuse/IVDU (UDS positive for cocaine/amphetamines) Counseled  Chronic HCV infection Outpatient management  BMI: Estimated body mass index is 22.86 kg/m as calculated from the following:   Height as of this encounter: 5\' 2"  (1.575 m).   Weight as of this encounter: 56.7 kg.   Code status:   Code Status: Full Code   DVT Prophylaxis: enoxaparin (LOVENOX) injection 40 mg Start: 11/18/22 1930   Family Communication: None at bedside   Disposition Plan: Status is: Inpatient  Remains inpatient appropriate because: Severity of illness.   Planned Discharge Destination:Home when she completes course of IV antibiotics.  Not a candidate for outpatient IV antibiotics.   Diet: Diet  Order             Diet regular Room service appropriate? Yes; Fluid consistency: Thin  Diet effective now                     Antimicrobial agents: Anti-infectives (From admission, onward)    Start     Dose/Rate Route Frequency Ordered Stop   11/30/22 1600  vancomycin (VANCOCIN) IVPB 1000 mg/200 mL premix        1,000 mg 200 mL/hr over 60 Minutes Intravenous Every 8 hours 11/29/22 1643     11/23/22 1400  DAPTOmycin (CUBICIN) 500 mg in sodium chloride 0.9 % IVPB        500 mg 120 mL/hr over 30 Minutes Intravenous Daily 11/23/22 0921 11/29/22 2029   11/23/22 1000  ceftaroline (TEFLARO) 600 mg in sodium chloride 0.9 % 100 mL IVPB        600 mg 100 mL/hr over 60 Minutes Intravenous Every 8 hours 11/23/22 0921 11/29/22 1849   11/22/22 1445  vancomycin (VANCOCIN) IVPB 1000 mg/200 mL premix  Status:  Discontinued        1,000 mg 200 mL/hr over 60 Minutes Intravenous Every 8 hours 11/22/22 1347 11/23/22 0921   11/19/22 0200  vancomycin (VANCOCIN) IVPB 750 mg/150 ml premix  Status:  Discontinued        750 mg 150 mL/hr over 60 Minutes Intravenous Every 12 hours 11/18/22 1354 11/19/22 0110   11/19/22 0200  vancomycin (VANCOREADY) IVPB 750 mg/150 mL  Status:  Discontinued        750 mg 150 mL/hr over 60 Minutes Intravenous Every 12 hours 11/19/22 0110 11/22/22 1347   11/19/22 0000  linezolid (ZYVOX) 600 MG tablet        600 mg Oral 2 times daily 11/19/22 1045 12/31/22 2359   11/18/22 1330  ceFEPIme (MAXIPIME) 2 g in sodium chloride 0.9 % 100 mL IVPB        2 g 200 mL/hr over 30 Minutes Intravenous  Once 11/18/22 1324 11/18/22 1408   11/18/22 1330  Vancomycin (VANCOCIN) 1,250 mg in sodium chloride 0.9 % 250 mL IVPB        1,250 mg 166.7 mL/hr over 90 Minutes Intravenous  Once 11/18/22 1329 11/18/22 1652        MEDICATIONS: Scheduled Meds:  enoxaparin (LOVENOX) injection  40 mg Subcutaneous Q24H   metoprolol tartrate  25 mg Oral BID   morphine  30 mg Oral Q12H   nicotine  14  mg Transdermal Daily   Continuous Infusions:  vancomycin     PRN Meds:.acetaminophen **OR** acetaminophen, ipratropium-albuterol, metoprolol tartrate, ondansetron **OR** ondansetron (ZOFRAN) IV, polyethylene glycol   I have personally reviewed following labs and imaging studies  LABORATORY DATA: CBC: Recent Labs  Lab 11/25/22 0936 11/28/22 0728  WBC 13.9* 12.5*  HGB 11.8* 10.4*  HCT 35.7* 31.2*  MCV 87.9 89.1  PLT 374 481*     Basic Metabolic Panel: Recent Labs  Lab 11/24/22 1020 11/25/22 0936 11/28/22 0728  NA 134* 138 132*  K 3.6 4.3 4.1  CL 100 103 99  CO2 25 25 26   GLUCOSE 102* 120* 101*  BUN 6 7 9   CREATININE 0.57 0.54 0.62  CALCIUM 8.0* 8.4* 8.4*     GFR: Estimated Creatinine Clearance: 80.6 mL/min (  by C-G formula based on SCr of 0.62 mg/dL).  Liver Function Tests: No results for input(s): "AST", "ALT", "ALKPHOS", "BILITOT", "PROT", "ALBUMIN" in the last 168 hours.  No results for input(s): "LIPASE", "AMYLASE" in the last 168 hours. No results for input(s): "AMMONIA" in the last 168 hours.  Coagulation Profile: Recent Labs  Lab 11/24/22 1020  INR 1.1     Cardiac Enzymes: Recent Labs  Lab 11/23/22 1303  CKTOTAL 16*     BNP (last 3 results) No results for input(s): "PROBNP" in the last 8760 hours.  Lipid Profile: No results for input(s): "CHOL", "HDL", "LDLCALC", "TRIG", "CHOLHDL", "LDLDIRECT" in the last 72 hours.  Thyroid Function Tests: No results for input(s): "TSH", "T4TOTAL", "FREET4", "T3FREE", "THYROIDAB" in the last 72 hours.  Anemia Panel: No results for input(s): "VITAMINB12", "FOLATE", "FERRITIN", "TIBC", "IRON", "RETICCTPCT" in the last 72 hours.  Urine analysis:    Component Value Date/Time   COLORURINE YELLOW 11/18/2022 1020   APPEARANCEUR CLOUDY (A) 11/18/2022 1020   LABSPEC 1.009 11/18/2022 1020   PHURINE 6.0 11/18/2022 1020   GLUCOSEU NEGATIVE 11/18/2022 1020   HGBUR SMALL (A) 11/18/2022 1020   BILIRUBINUR  NEGATIVE 11/18/2022 1020   KETONESUR NEGATIVE 11/18/2022 1020   PROTEINUR NEGATIVE 11/18/2022 1020   UROBILINOGEN 0.2 07/02/2015 1044   NITRITE POSITIVE (A) 11/18/2022 1020   LEUKOCYTESUR MODERATE (A) 11/18/2022 1020    Sepsis Labs: Lactic Acid, Venous    Component Value Date/Time   LATICACIDVEN 1.4 11/18/2022 1303    MICROBIOLOGY: Recent Results (from the past 240 hour(s))  Culture, blood (Routine X 2) w Reflex to ID Panel     Status: Abnormal   Collection Time: 11/21/22 10:04 AM   Specimen: BLOOD LEFT HAND  Result Value Ref Range Status   Specimen Description BLOOD LEFT HAND  Final   Special Requests   Final    BOTTLES DRAWN AEROBIC AND ANAEROBIC Blood Culture adequate volume   Culture  Setup Time   Final    GRAM POSITIVE COCCI IN BOTH AEROBIC AND ANAEROBIC BOTTLES CRITICAL VALUE NOTED.  VALUE IS CONSISTENT WITH PREVIOUSLY REPORTED AND CALLED VALUE.    Culture (A)  Final    STAPHYLOCOCCUS AUREUS SUSCEPTIBILITIES PERFORMED ON PREVIOUS CULTURE WITHIN THE LAST 5 DAYS. Performed at [facility], [address], [place], [place] [address]    Report Status 11/24/2022 FINAL  Final  Culture, blood (Routine X 2) w Reflex to ID Panel     Status: Abnormal   Collection Time: 11/21/22 10:14 AM   Specimen: BLOOD  Result Value Ref Range Status   Specimen Description BLOOD RIGHT ANTECUBITAL  Final   Special Requests   Final    BOTTLES DRAWN AEROBIC ONLY Blood Culture adequate volume   Culture  Setup Time   Final    GRAM POSITIVE COCCI AEROBIC BOTTLE ONLY Gram Stain Report Called to,Read Back By and Verified With: PHARMD G. ABBOTT 11/21/22 @2321  BY AB    Culture (A)  Final    STAPHYLOCOCCUS AUREUS SUSCEPTIBILITIES PERFORMED ON PREVIOUS CULTURE WITHIN THE LAST 5 DAYS. Performed at [facility], [address], [place], [place]    Report Status 11/24/2022 FINAL  Final  Culture, blood (Routine X 2) w Reflex to ID Panel     Status: Abnormal   Collection Time:  11/22/22 12:03 PM   Specimen: BLOOD LEFT HAND  Result Value Ref Range Status   Specimen Description BLOOD LEFT HAND  Final   Special Requests  Final    BOTTLES DRAWN AEROBIC AND ANAEROBIC Blood Culture adequate volume   Culture  Setup Time   Final    GRAM POSITIVE COCCI IN CLUSTERS ANAEROBIC BOTTLE ONLY CRITICAL VALUE NOTED.  VALUE IS CONSISTENT WITH PREVIOUSLY REPORTED AND CALLED VALUE.    Culture (A)  Final    STAPHYLOCOCCUS AUREUS SUSCEPTIBILITIES PERFORMED ON PREVIOUS CULTURE WITHIN THE LAST 5 DAYS. Performed at [facility], [place] [address], [place], [place] [address]    Report Status 11/25/2022 FINAL  Final  Culture, blood (Routine X 2) w Reflex to ID Panel     Status: Abnormal   Collection Time: 11/22/22 12:24 PM   Specimen: BLOOD RIGHT HAND  Result Value Ref Range Status   Specimen Description BLOOD RIGHT HAND  Final   Special Requests   Final    BOTTLES DRAWN AEROBIC AND ANAEROBIC Blood Culture adequate volume   Culture  Setup Time   Final    GRAM POSITIVE COCCI IN CLUSTERS IN BOTH AEROBIC AND ANAEROBIC BOTTLES Gram Stain Report Called to,Read Back By and Verified With: PHARMD LEDFORD 11/23/22 @ 0512 BY AB    Culture (A)  Final    STAPHYLOCOCCUS AUREUS SUSCEPTIBILITIES PERFORMED ON PREVIOUS CULTURE WITHIN THE LAST 5 DAYS. Performed at [facility], [place] [address], [place], [place] [address]    Report Status 11/25/2022 FINAL  Final  Culture, blood (Routine X 2) w Reflex to ID Panel     Status: None   Collection Time: 11/23/22  1:03 PM   Specimen: BLOOD LEFT ARM  Result Value Ref Range Status   Specimen Description BLOOD LEFT ARM  Final   Special Requests   Final    BOTTLES DRAWN AEROBIC ONLY Blood Culture adequate volume   Culture   Final    NO GROWTH 5 DAYS Performed at [facility], [place] [address] [place], [place] [address]    Report Status 11/28/2022 FINAL  Final  Culture, blood (Routine X 2) w Reflex to ID Panel     Status: None    Collection Time: 11/23/22  1:04 PM   Specimen: BLOOD LEFT ARM  Result Value Ref Range Status   Specimen Description BLOOD LEFT ARM  Final   Special Requests   Final    BOTTLES DRAWN AEROBIC ONLY Blood Culture adequate volume   Culture   Final    NO GROWTH 5 DAYS Performed at [facility], [place] [address], [place], [place] [address]    Report Status 11/28/2022 FINAL  Final    RADIOLOGY STUDIES/RESULTS: No results found.   LOS: 12 days   Oren Binet, MD  [facility]    To contact the attending provider between 7A-7P or the covering provider during after hours 7P-7A, please log into the web site [email or web address] and access using universal [place] password for that web site. If you do not have the password, please call the hospital operator.  11/30/2022, 10:30 AM

## 2022-12-01 LAB — VANCOMYCIN, PEAK: Vancomycin Pk: 39 ug/mL (ref 30–40)

## 2022-12-01 LAB — GLUCOSE, CAPILLARY: Glucose-Capillary: 96 mg/dL (ref 70–99)

## 2022-12-01 MED ORDER — BUPRENORPHINE HCL-NALOXONE HCL 2-0.5 MG SL SUBL
1.0000 | SUBLINGUAL_TABLET | SUBLINGUAL | Status: AC | PRN
  Administered 2022-12-01 – 2022-12-02 (×3): 1 via SUBLINGUAL
  Filled 2022-12-01 (×3): qty 1

## 2022-12-01 MED ORDER — BUPRENORPHINE HCL-NALOXONE HCL 8-2 MG SL SUBL
1.0000 | SUBLINGUAL_TABLET | Freq: Two times a day (BID) | SUBLINGUAL | Status: DC
  Administered 2022-12-02: 1 via SUBLINGUAL
  Filled 2022-12-01: qty 1

## 2022-12-01 NOTE — Progress Notes (Addendum)
PROGRESS NOTE        PATIENT DETAILS Name: Cassandra Roberts Age: 32 y.o. Sex: female Date of Birth: Jul 25, 1991 Admit Date: 11/18/2022 Admitting Physician Marinda Elk, MD JJK:[id number], No Pcp Per  Brief Summary: Patient is a 32 y.o.  female with history of IVDA-recent hospitalization at [facility] for multifocal pneumonia-left AMA-presented with chest tightness/shortness of breath-she was found to have sepsis due to multifocal pneumonia-and subsequently blood cultures positive for MRSA.  Significant events: 1/11>> admit to [facility]-sepsis-multifocal PNA-blood cultures subsequently positive for MRSA. 1/23>> pinpoint pupils/barely responsive-given Narcan-improved.  Suspicion for illicitly using narcotics in the hospital.  Significant studies: 1/11>> CT chest: Multifocal lung consolidation-greatest in the left side. 1/12>> TTE: EF 70-75%-no obvious vegetation 1/15>> MRI left shoulder: Moderate ill-defined fluid between the deltoid, infraspinatus and teres minor muscles.  No well-defined fluid collection identified. 1/15>> MRI left femur: Motion degraded exam-nondiagnostic exam. 1/16>> MRI left hip: Mild to moderate fluid tracking along the anterior aspect of the left iliopsoas junction-nonspecific-may represent bursitis.  Significant microbiology data: 1/11>> COVID/influenza/RSV PCR: Negative 1/11>> blood culture: MRSA 1/13>> blood culture: MRSA 1/14>> blood culture: MRSA 1/15>> blood culture: MRSA 1/16>> blood culture: Negative  Procedures: 1/17>> TEE: No vegetation.  Consults: ID Orthopedics  Subjective: Appears comfortable but thinks she is withdrawing from narcotics.  Complains of sweats/chills.  Objective: Vitals: Blood pressure (!) 111/58, pulse 91, temperature 98.6 F (37 C), temperature source Oral, resp. rate (!) 21, height 5\' 2"  (1.575 m), weight 56.7 kg, last menstrual period 11/04/2022, SpO2 93 %.   Exam: Gen  Exam:Alert awake-not in any distress HEENT:atraumatic, normocephalic Chest: B/L clear to auscultation anteriorly CVS:S1S2 regular Abdomen:soft non tender, non distended Extremities:no edema Neurology: Non focal Skin: no rash  Pertinent Labs/Radiology:    Latest Ref Rng & Units 11/30/2022   12:00 PM 11/28/2022    7:28 AM 11/25/2022    9:36 AM  CBC  WBC 4.0 - 10.5 K/uL 13.1  12.5  13.9   Hemoglobin 12.0 - 15.0 g/dL 11/27/2022  99.3  71.6   Hematocrit 36.0 - 46.0 % 32.9  31.2  35.7   Platelets 150 - 400 K/uL 573  481  374     Lab Results  Component Value Date   NA 133 (L) 11/30/2022   K 4.4 11/30/2022   CL 97 (L) 11/30/2022   CO2 25 11/30/2022      Assessment/Plan: Sepsis secondary to MRSA bacteremia/septic emboli Sepsis physiology resolved Had persistent bacteremia initially-however blood cultures since 1/16 remain negative.  Persistent bacteremia most likely due to underdosing of vancomycin. Was switched to Teflaro/daptomycin-ID planning to switch back to vancomycin beginning 1/23. TEE negative Continue to monitor clinically-appears to be stabilizing-no longer with hip pain/right flank pain/chest pain.   ID recommending 4 weeks of IV antibiotics from 1/16. She is not a candidate for outpatient IV antibiotics and will need to remain inpatient to complete 4 weeks of treatment.  History of polysubstance abuse/IVDU (UDS positive for cocaine/amphetamines) Counseled Please see notes from 1/23 Thinks she is withdrawing from narcotics-these were stopped on 1/23 following a possible polypharmacy causing sedation/pinpoint pupils requiring Narcan.   Patient interested in Suboxone-she is aware that this will not be prescribed on discharge.  Transition of care team will provide outpatient resources-have encouraged patient to try and get an appointment in the Suboxone clinic locally.  Chronic HCV infection  Outpatient management  BMI: Estimated body mass index is 22.86 kg/m as calculated  from the following:   Height as of this encounter: 5\' 2"  (1.575 m).   Weight as of this encounter: 56.7 kg.   Code status:   Code Status: Full Code   DVT Prophylaxis: enoxaparin (LOVENOX) injection 40 mg Start: 11/18/22 1930   Family Communication: Informed by charge nurse on 1/24 that the patient's mother wanted an update, I subsequently spoke with patient-she does not give me consent to talk to her mother.   Disposition Plan: Status is: Inpatient Remains inpatient appropriate because: Severity of illness.   Planned Discharge Destination:Home when she completes course of IV antibiotics.  Not a candidate for outpatient IV antibiotics.   Diet: Diet Order             Diet regular Room service appropriate? Yes; Fluid consistency: Thin  Diet effective now                     Antimicrobial agents: Anti-infectives (From admission, onward)    Start     Dose/Rate Route Frequency Ordered Stop   11/30/22 1600  vancomycin (VANCOCIN) IVPB 1000 mg/200 mL premix        1,000 mg 200 mL/hr over 60 Minutes Intravenous Every 8 hours 11/29/22 1643     11/23/22 1400  DAPTOmycin (CUBICIN) 500 mg in sodium chloride 0.9 % IVPB        500 mg 120 mL/hr over 30 Minutes Intravenous Daily 11/23/22 0921 11/29/22 2029   11/23/22 1000  ceftaroline (TEFLARO) 600 mg in sodium chloride 0.9 % 100 mL IVPB        600 mg 100 mL/hr over 60 Minutes Intravenous Every 8 hours 11/23/22 0921 11/29/22 1849   11/22/22 1445  vancomycin (VANCOCIN) IVPB 1000 mg/200 mL premix  Status:  Discontinued        1,000 mg 200 mL/hr over 60 Minutes Intravenous Every 8 hours 11/22/22 1347 11/23/22 0921   11/19/22 0200  vancomycin (VANCOCIN) IVPB 750 mg/150 ml premix  Status:  Discontinued        750 mg 150 mL/hr over 60 Minutes Intravenous Every 12 hours 11/18/22 1354 11/19/22 0110   11/19/22 0200  vancomycin (VANCOREADY) IVPB 750 mg/150 mL  Status:  Discontinued        750 mg 150 mL/hr over 60 Minutes Intravenous Every  12 hours 11/19/22 0110 11/22/22 1347   11/19/22 0000  linezolid (ZYVOX) 600 MG tablet        600 mg Oral 2 times daily 11/19/22 1045 12/31/22 2359   11/18/22 1330  ceFEPIme (MAXIPIME) 2 g in sodium chloride 0.9 % 100 mL IVPB        2 g 200 mL/hr over 30 Minutes Intravenous  Once 11/18/22 1324 11/18/22 1408   11/18/22 1330  Vancomycin (VANCOCIN) 1,250 mg in sodium chloride 0.9 % 250 mL IVPB        1,250 mg 166.7 mL/hr over 90 Minutes Intravenous  Once 11/18/22 1329 11/18/22 1652        MEDICATIONS: Scheduled Meds:  [START ON 12/02/2022] buprenorphine-naloxone  1 tablet Sublingual BID   enoxaparin (LOVENOX) injection  40 mg Subcutaneous Q24H   metoprolol tartrate  25 mg Oral BID   nicotine  14 mg Transdermal Daily   Continuous Infusions:  vancomycin 1,000 mg (12/01/22 0913)   PRN Meds:.acetaminophen **OR** acetaminophen, buprenorphine-naloxone, ipratropium-albuterol, metoprolol tartrate, naLOXone (NARCAN)  injection, ondansetron **OR** ondansetron (ZOFRAN) IV, polyethylene glycol   I  have personally reviewed following labs and imaging studies  LABORATORY DATA: CBC: Recent Labs  Lab 11/25/22 0936 11/28/22 0728 11/30/22 1200  WBC 13.9* 12.5* 13.1*  HGB 11.8* 10.4* 10.8*  HCT 35.7* 31.2* 32.9*  MCV 87.9 89.1 91.1  PLT 374 481* 573*     Basic Metabolic Panel: Recent Labs  Lab 11/25/22 0936 11/28/22 0728 11/30/22 1200  NA 138 132* 133*  K 4.3 4.1 4.4  CL 103 99 97*  CO2 25 26 25   GLUCOSE 120* 101* 132*  BUN 7 9 11   CREATININE 0.54 0.62 0.67  CALCIUM 8.4* 8.4* 8.8*     GFR: Estimated Creatinine Clearance: 80.6 mL/min (by C-G formula based on SCr of 0.67 mg/dL).  Liver Function Tests: No results for input(s): "AST", "ALT", "ALKPHOS", "BILITOT", "PROT", "ALBUMIN" in the last 168 hours.  No results for input(s): "LIPASE", "AMYLASE" in the last 168 hours. No results for input(s): "AMMONIA" in the last 168 hours.  Coagulation Profile: No results for input(s):  "INR", "PROTIME" in the last 168 hours.   Cardiac Enzymes: Recent Labs  Lab 11/30/22 1200  CKTOTAL 33*     BNP (last 3 results) No results for input(s): "PROBNP" in the last 8760 hours.  Lipid Profile: No results for input(s): "CHOL", "HDL", "LDLCALC", "TRIG", "CHOLHDL", "LDLDIRECT" in the last 72 hours.  Thyroid Function Tests: No results for input(s): "TSH", "T4TOTAL", "FREET4", "T3FREE", "THYROIDAB" in the last 72 hours.  Anemia Panel: No results for input(s): "VITAMINB12", "FOLATE", "FERRITIN", "TIBC", "IRON", "RETICCTPCT" in the last 72 hours.  Urine analysis:    Component Value Date/Time   COLORURINE YELLOW 11/18/2022 1020   APPEARANCEUR CLOUDY (A) 11/18/2022 1020   LABSPEC 1.009 11/18/2022 1020   PHURINE 6.0 11/18/2022 1020   GLUCOSEU NEGATIVE 11/18/2022 1020   HGBUR SMALL (A) 11/18/2022 1020   BILIRUBINUR NEGATIVE 11/18/2022 1020   KETONESUR NEGATIVE 11/18/2022 1020   PROTEINUR NEGATIVE 11/18/2022 1020   UROBILINOGEN 0.2 07/02/2015 1044   NITRITE POSITIVE (A) 11/18/2022 1020   LEUKOCYTESUR MODERATE (A) 11/18/2022 1020    Sepsis Labs: Lactic Acid, Venous    Component Value Date/Time   LATICACIDVEN 1.4 11/18/2022 1303    MICROBIOLOGY: Recent Results (from the past 240 hour(s))  Culture, blood (Routine X 2) w Reflex to ID Panel     Status: Abnormal   Collection Time: 11/22/22 12:03 PM   Specimen: BLOOD LEFT HAND  Result Value Ref Range Status   Specimen Description BLOOD LEFT HAND  Final   Special Requests   Final    BOTTLES DRAWN AEROBIC AND ANAEROBIC Blood Culture adequate volume   Culture  Setup Time   Final    GRAM POSITIVE COCCI IN CLUSTERS ANAEROBIC BOTTLE ONLY CRITICAL VALUE NOTED.  VALUE IS CONSISTENT WITH PREVIOUSLY REPORTED AND CALLED VALUE.    Culture (A)  Final    STAPHYLOCOCCUS AUREUS SUSCEPTIBILITIES PERFORMED ON PREVIOUS CULTURE WITHIN THE LAST 5 DAYS. Performed at [facility], [place] [address] [place], [place] [address]     Report Status 11/25/2022 FINAL  Final  Culture, blood (Routine X 2) w Reflex to ID Panel     Status: Abnormal   Collection Time: 11/22/22 12:24 PM   Specimen: BLOOD RIGHT HAND  Result Value Ref Range Status   Specimen Description BLOOD RIGHT HAND  Final   Special Requests   Final    BOTTLES DRAWN AEROBIC AND ANAEROBIC Blood Culture adequate volume   Culture  Setup Time   Final    GRAM POSITIVE  COCCI IN CLUSTERS IN BOTH AEROBIC AND ANAEROBIC BOTTLES Gram Stain Report Called to,Read Back By and Verified With: PHARMD LEDFORD 11/23/22 @ 0512 BY AB    Culture (A)  Final    STAPHYLOCOCCUS AUREUS SUSCEPTIBILITIES PERFORMED ON PREVIOUS CULTURE WITHIN THE LAST 5 DAYS. Performed at [facility], [place] [address], [place], [place] [address]    Report Status 11/25/2022 FINAL  Final  Culture, blood (Routine X 2) w Reflex to ID Panel     Status: None   Collection Time: 11/23/22  1:03 PM   Specimen: BLOOD LEFT ARM  Result Value Ref Range Status   Specimen Description BLOOD LEFT ARM  Final   Special Requests   Final    BOTTLES DRAWN AEROBIC ONLY Blood Culture adequate volume   Culture   Final    NO GROWTH 5 DAYS Performed at [facility], [place] [address] [place], [place] [address]    Report Status 11/28/2022 FINAL  Final  Culture, blood (Routine X 2) w Reflex to ID Panel     Status: None   Collection Time: 11/23/22  1:04 PM   Specimen: BLOOD LEFT ARM  Result Value Ref Range Status   Specimen Description BLOOD LEFT ARM  Final   Special Requests   Final    BOTTLES DRAWN AEROBIC ONLY Blood Culture adequate volume   Culture   Final    NO GROWTH 5 DAYS Performed at [facility], [place] [address], [place], [place] [address]    Report Status 11/28/2022 FINAL  Final    RADIOLOGY STUDIES/RESULTS: [place] EKG SITE RITE  Result Date: 11/30/2022 If Site Rite image not attached, placement could not be confirmed due to current cardiac rhythm.    LOS: 13 days   Oren Binet,  MD  [facility]    To contact the attending provider between 7A-7P or the covering provider during after hours 7P-7A, please log into the web site [email or web address] and access using universal [facility] password for that web site. If you do not have the password, please call the hospital operator.  12/01/2022, 11:23 AM

## 2022-12-01 NOTE — TOC Progression Note (Signed)
Transition of Care [facility]) - Progression Note    Patient Details  Name: Cassandra Roberts MRN: [id number] Date of Birth: 11-11-1990  Transition of Care [facility]) CM/SW [place], LCSW Phone Number: 12/01/2022, 2:39 PM  Clinical Narrative:    CSW received request from MD to provide list of suboxone providers to patient. CSW met with patient. She initially stated that she did not have anything to say to CSW but once CSW explained reason for visit, she accepted the list of providers.    Expected Discharge Plan: Home/Self Care Barriers to Discharge: Continued Medical Work up, Active Substance Use with PICC Line  Expected Discharge Plan and Services In-house Referral: NA Discharge Planning Services: NA Post Acute Care Choice: NA Living arrangements for the past 2 months: Single Family Home                 DME Arranged: N/A DME Agency: NA       HH Arranged:  (NONE) [place] Agency: NA         Social Determinants of Health (SDOH) Interventions SDOH Screenings   Food Insecurity: No Food Insecurity (11/18/2022)  Housing: Low Risk  (11/18/2022)  Transportation Needs: No Transportation Needs (11/18/2022)  Utilities: Not At Risk (11/18/2022)  Tobacco Use: High Risk (11/27/2022)    Readmission Risk Interventions     No data to display

## 2022-12-01 NOTE — Progress Notes (Signed)
   12/01/22 1500  Spiritual Encounters  Type of Visit Initial  Care provided to: Patient  Referral source Nurse (RN/NT/LPN)  Reason for visit Routine spiritual support  OnCall Visit No   [facility] went to visit pt. But nurse determine that pt was sleeping. [place] ask nurse to page [facility] phone when pt wakes up. No follow-up needed at this time.

## 2022-12-02 ENCOUNTER — Other Ambulatory Visit (HOSPITAL_COMMUNITY): Payer: Self-pay

## 2022-12-02 MED ORDER — LINEZOLID 600 MG PO TABS
600.0000 mg | ORAL_TABLET | Freq: Two times a day (BID) | ORAL | Status: DC
  Filled 2022-12-02: qty 1

## 2022-12-02 MED ORDER — LINEZOLID 600 MG PO TABS
600.0000 mg | ORAL_TABLET | Freq: Two times a day (BID) | ORAL | 0 refills | Status: AC
  Filled 2022-12-02: qty 64, 32d supply, fill #0

## 2022-12-02 MED ORDER — SODIUM CHLORIDE 0.9 % IV SOLN
500.0000 mg | Freq: Every day | INTRAVENOUS | Status: DC
  Filled 2022-12-02: qty 10

## 2022-12-02 NOTE — Progress Notes (Signed)
PROGRESS NOTE        PATIENT DETAILS Name: Cassandra Roberts Age: 32 y.o. Sex: female Date of Birth: [birth year] Admit Date: 11/18/2022 Admitting Physician Charlynne Cousins, MD BP:[id number], No Pcp Per  Brief Summary: Patient is a 32 y.o.  female with history of IVDA-recent hospitalization at [facility] for multifocal pneumonia-left AMA-presented with chest tightness/shortness of breath-she was found to have sepsis due to multifocal pneumonia-and subsequently blood cultures positive for MRSA.  Significant events: 1/11>> admit to [facility]-sepsis-multifocal PNA-blood cultures subsequently positive for MRSA. 1/23>> pinpoint pupils/barely responsive-given Narcan-improved.  Suspicion for illicitly using narcotics in the hospital.  Significant studies: 1/11>> CT chest: Multifocal lung consolidation-greatest in the left side. 1/12>> TTE: EF 70-75%-no obvious vegetation 1/15>> MRI left shoulder: Moderate ill-defined fluid between the deltoid, infraspinatus and teres minor muscles.  No well-defined fluid collection identified. 1/15>> MRI left femur: Motion degraded exam-nondiagnostic exam. 1/16>> MRI left hip: Mild to moderate fluid tracking along the anterior aspect of the left iliopsoas junction-nonspecific-may represent bursitis.  Significant microbiology data: 1/11>> COVID/influenza/RSV PCR: Negative 1/11>> blood culture: MRSA 1/13>> blood culture: MRSA 1/14>> blood culture: MRSA 1/15>> blood culture: MRSA 1/16>> blood culture: Negative  Procedures: 1/17>> TEE: No vegetation.  Consults: ID Orthopedics  Subjective: Rounded with charge nurse John-no major issues overnight.  Patient appears comfortable-sleeping.  Denies any pain.  Objective: Vitals: Blood pressure 115/65, pulse 74, temperature 99 F (37.2 C), temperature source Oral, resp. rate (!) 21, height 5\' 2"  (1.575 m), weight 56.7 kg, last menstrual period 11/04/2022, SpO2 93 %.    Exam: Gen Exam:Alert awake-not in any distress HEENT:atraumatic, normocephalic Chest: B/L clear to auscultation anteriorly CVS:S1S2 regular Abdomen:soft non tender, non distended Extremities:no edema Neurology: Non focal Skin: no rash  Pertinent Labs/Radiology:    Latest Ref Rng & Units 11/30/2022   12:00 PM 11/28/2022    7:28 AM 11/25/2022    9:36 AM  CBC  WBC 4.0 - 10.5 K/uL 13.1  12.5  13.9   Hemoglobin 12.0 - 15.0 g/dL 10.8  10.4  11.8   Hematocrit 36.0 - 46.0 % 32.9  31.2  35.7   Platelets 150 - 400 K/uL 573  481  374     Lab Results  Component Value Date   NA 133 (L) 11/30/2022   K 4.4 11/30/2022   CL 97 (L) 11/30/2022   CO2 25 11/30/2022     Assessment/Plan: Sepsis secondary to MRSA bacteremia/septic emboli Sepsis physiology resolved Had persistent bacteremia initially-however blood cultures since 1/16 remain negative.  Persistent bacteremia most likely due to underdosing of vancomycin. Was switched to Teflaro/daptomycin-ID planning to switch back to vancomycin beginning 1/23. TEE negative Continue to monitor clinically-appears to be stabilizing-no longer with hip pain/right flank pain/chest pain.   ID recommending 4 weeks of IV antibiotics from 1/16. She is not a candidate for outpatient IV antibiotics and will need to remain inpatient to complete 4 weeks of treatment.  History of polysubstance abuse/IVDU (UDS positive for cocaine/amphetamines) Counseled Please see notes from 0000000 may have illicitly used other narcotics-she was unresponsive with pinpoint pupils and required Narcan. OxyContin was subsequently discontinued Due to withdrawal symptoms-she was started on Suboxone 1/24 Patient is aware that she will not be provided Suboxone on discharge-social worker has already provided her outpatient resources/Suboxone clinics in the area-she has been asked repeatedly to start making an appointment.  Chronic HCV infection Outpatient  management  BMI: Estimated body mass index is 22.86 kg/m as calculated from the following:   Height as of this encounter: 5\' 2"  (1.575 m).   Weight as of this encounter: 56.7 kg.   Code status:   Code Status: Full Code   DVT Prophylaxis: enoxaparin (LOVENOX) injection 40 mg Start: 11/18/22 1930   Family Communication: Informed by charge nurse on 1/24 that the patient's mother wanted an update, I subsequently spoke with patient-she does not give me consent to talk to her mother.   Disposition Plan: Status is: Inpatient Remains inpatient appropriate because: Severity of illness.   Planned Discharge Destination:Home when she completes course of IV antibiotics.  Not a candidate for outpatient IV antibiotics.   Diet: Diet Order             Diet regular Room service appropriate? Yes; Fluid consistency: Thin  Diet effective now                     Antimicrobial agents: Anti-infectives (From admission, onward)    Start     Dose/Rate Route Frequency Ordered Stop   11/30/22 1600  vancomycin (VANCOCIN) IVPB 1000 mg/200 mL premix        1,000 mg 200 mL/hr over 60 Minutes Intravenous Every 8 hours 11/29/22 1643     11/23/22 1400  DAPTOmycin (CUBICIN) 500 mg in sodium chloride 0.9 % IVPB        500 mg 120 mL/hr over 30 Minutes Intravenous Daily 11/23/22 0921 11/29/22 2029   11/23/22 1000  ceftaroline (TEFLARO) 600 mg in sodium chloride 0.9 % 100 mL IVPB        600 mg 100 mL/hr over 60 Minutes Intravenous Every 8 hours 11/23/22 0921 11/29/22 1849   11/22/22 1445  vancomycin (VANCOCIN) IVPB 1000 mg/200 mL premix  Status:  Discontinued        1,000 mg 200 mL/hr over 60 Minutes Intravenous Every 8 hours 11/22/22 1347 11/23/22 0921   11/19/22 0200  vancomycin (VANCOCIN) IVPB 750 mg/150 ml premix  Status:  Discontinued        750 mg 150 mL/hr over 60 Minutes Intravenous Every 12 hours 11/18/22 1354 11/19/22 0110   11/19/22 0200  vancomycin (VANCOREADY) IVPB 750 mg/150 mL  Status:   Discontinued        750 mg 150 mL/hr over 60 Minutes Intravenous Every 12 hours 11/19/22 0110 11/22/22 1347   11/19/22 0000  linezolid (ZYVOX) 600 MG tablet        600 mg Oral 2 times daily 11/19/22 1045 12/31/22 2359   11/18/22 1330  ceFEPIme (MAXIPIME) 2 g in sodium chloride 0.9 % 100 mL IVPB        2 g 200 mL/hr over 30 Minutes Intravenous  Once 11/18/22 1324 11/18/22 1408   11/18/22 1330  Vancomycin (VANCOCIN) 1,250 mg in sodium chloride 0.9 % 250 mL IVPB        1,250 mg 166.7 mL/hr over 90 Minutes Intravenous  Once 11/18/22 1329 11/18/22 1652        MEDICATIONS: Scheduled Meds:  buprenorphine-naloxone  1 tablet Sublingual BID   enoxaparin (LOVENOX) injection  40 mg Subcutaneous Q24H   metoprolol tartrate  25 mg Oral BID   nicotine  14 mg Transdermal Daily   Continuous Infusions:  vancomycin 1,000 mg (12/02/22 0919)   PRN Meds:.acetaminophen **OR** acetaminophen, ipratropium-albuterol, metoprolol tartrate, naLOXone (NARCAN)  injection, ondansetron **OR** ondansetron (ZOFRAN) IV, polyethylene glycol   I have  personally reviewed following labs and imaging studies  LABORATORY DATA: CBC: Recent Labs  Lab 11/28/22 0728 11/30/22 1200  WBC 12.5* 13.1*  HGB 10.4* 10.8*  HCT 31.2* 32.9*  MCV 89.1 91.1  PLT 481* 573*     Basic Metabolic Panel: Recent Labs  Lab 11/28/22 0728 11/30/22 1200  NA 132* 133*  K 4.1 4.4  CL 99 97*  CO2 26 25  GLUCOSE 101* 132*  BUN 9 11  CREATININE 0.62 0.67  CALCIUM 8.4* 8.8*     GFR: Estimated Creatinine Clearance: 80.6 mL/min (by C-G formula based on SCr of 0.67 mg/dL).  Liver Function Tests: No results for input(s): "AST", "ALT", "ALKPHOS", "BILITOT", "PROT", "ALBUMIN" in the last 168 hours.  No results for input(s): "LIPASE", "AMYLASE" in the last 168 hours. No results for input(s): "AMMONIA" in the last 168 hours.  Coagulation Profile: No results for input(s): "INR", "PROTIME" in the last 168 hours.   Cardiac  Enzymes: Recent Labs  Lab 11/30/22 1200  CKTOTAL 33*     BNP (last 3 results) No results for input(s): "PROBNP" in the last 8760 hours.  Lipid Profile: No results for input(s): "CHOL", "HDL", "LDLCALC", "TRIG", "CHOLHDL", "LDLDIRECT" in the last 72 hours.  Thyroid Function Tests: No results for input(s): "TSH", "T4TOTAL", "FREET4", "T3FREE", "THYROIDAB" in the last 72 hours.  Anemia Panel: No results for input(s): "VITAMINB12", "FOLATE", "FERRITIN", "TIBC", "IRON", "RETICCTPCT" in the last 72 hours.  Urine analysis:    Component Value Date/Time   COLORURINE YELLOW 11/18/2022 1020   APPEARANCEUR CLOUDY (A) 11/18/2022 1020   LABSPEC 1.009 11/18/2022 1020   PHURINE 6.0 11/18/2022 1020   GLUCOSEU NEGATIVE 11/18/2022 1020   HGBUR SMALL (A) 11/18/2022 1020   BILIRUBINUR NEGATIVE 11/18/2022 1020   KETONESUR NEGATIVE 11/18/2022 1020   PROTEINUR NEGATIVE 11/18/2022 1020   UROBILINOGEN 0.2 07/02/2015 1044   NITRITE POSITIVE (A) 11/18/2022 1020   LEUKOCYTESUR MODERATE (A) 11/18/2022 1020    Sepsis Labs: Lactic Acid, Venous    Component Value Date/Time   LATICACIDVEN 1.4 11/18/2022 1303    MICROBIOLOGY: Recent Results (from the past 240 hour(s))  Culture, blood (Routine X 2) w Reflex to ID Panel     Status: Abnormal   Collection Time: 11/22/22 12:03 PM   Specimen: BLOOD LEFT HAND  Result Value Ref Range Status   Specimen Description BLOOD LEFT HAND  Final   Special Requests   Final    BOTTLES DRAWN AEROBIC AND ANAEROBIC Blood Culture adequate volume   Culture  Setup Time   Final    GRAM POSITIVE COCCI IN CLUSTERS ANAEROBIC BOTTLE ONLY CRITICAL VALUE NOTED.  VALUE IS CONSISTENT WITH PREVIOUSLY REPORTED AND CALLED VALUE.    Culture (A)  Final    STAPHYLOCOCCUS AUREUS SUSCEPTIBILITIES PERFORMED ON PREVIOUS CULTURE WITHIN THE LAST 5 DAYS. Performed at [facility], [place] [address] [place], [place] [address]    Report Status 11/25/2022 FINAL  Final  Culture, blood  (Routine X 2) w Reflex to ID Panel     Status: Abnormal   Collection Time: 11/22/22 12:24 PM   Specimen: BLOOD RIGHT HAND  Result Value Ref Range Status   Specimen Description BLOOD RIGHT HAND  Final   Special Requests   Final    BOTTLES DRAWN AEROBIC AND ANAEROBIC Blood Culture adequate volume   Culture  Setup Time   Final    GRAM POSITIVE COCCI IN CLUSTERS IN BOTH AEROBIC AND ANAEROBIC BOTTLES Gram Stain Report Called to,Read Back By and Verified  With: PHARMD LEDFORD 11/23/22 @ 0512 BY AB    Culture (A)  Final    STAPHYLOCOCCUS AUREUS SUSCEPTIBILITIES PERFORMED ON PREVIOUS CULTURE WITHIN THE LAST 5 DAYS. Performed at [facility], [address] [place], [place] [address]    Report Status 11/25/2022 FINAL  Final  Culture, blood (Routine X 2) w Reflex to ID Panel     Status: None   Collection Time: 11/23/22  1:03 PM   Specimen: BLOOD LEFT ARM  Result Value Ref Range Status   Specimen Description BLOOD LEFT ARM  Final   Special Requests   Final    BOTTLES DRAWN AEROBIC ONLY Blood Culture adequate volume   Culture   Final    NO GROWTH 5 DAYS Performed at [facility], [address] [place], [place] [address]    Report Status 11/28/2022 FINAL  Final  Culture, blood (Routine X 2) w Reflex to ID Panel     Status: None   Collection Time: 11/23/22  1:04 PM   Specimen: BLOOD LEFT ARM  Result Value Ref Range Status   Specimen Description BLOOD LEFT ARM  Final   Special Requests   Final    BOTTLES DRAWN AEROBIC ONLY Blood Culture adequate volume   Culture   Final    NO GROWTH 5 DAYS Performed at [facility], [address], [place], [place] [address]    Report Status 11/28/2022 FINAL  Final    RADIOLOGY STUDIES/RESULTS: [place] EKG SITE RITE  Result Date: 11/30/2022 If Site Rite image not attached, placement could not be confirmed due to current cardiac rhythm.    LOS: 14 days   Jeoffrey Massed, MD  [facility]    To contact the attending  provider between 7A-7P or the covering provider during after hours 7P-7A, please log into the web site [email or web address] and access using universal [place] password for that web site. If you do not have the password, please call the hospital operator.  12/02/2022, 11:53 AM

## 2022-12-02 NOTE — Discharge Summary (Signed)
PATIENT DETAILS Name: Cassandra Roberts Age: 32 y.o. Sex: female Date of Birth: 07-25-1991 MRN: [id number]. Admitting Physician: Charlynne Cousins, Roberts WEX:[id number], No Pcp Per  Admit Date: 11/18/2022 Discharge date: 12/02/2022  NOTE PATIENT ELOPED OFF THE UNIT  Recommendations for Outpatient Follow-up:  Follow up with PCP in 1-2 weeks Please obtain CMP/CBC in one week She probably would benefit from a dose of oritavancin if she re-presents to the ED/urgent care-as she eloped off the unit without even letting the nursing staff know Needs ongoing counseling regarding drug use   Admitted From:  Home  Disposition: Eloped (left the unit/hospital without even signing out AMA!!)   Discharge Condition: fair  CODE STATUS:   Code Status: Full Code   Diet recommendation:  Diet Order             Diet regular Room service appropriate? Yes; Fluid consistency: Thin  Diet effective now                    Brief Summary: Patient is a 32 y.o.  female with history of IVDA-recent hospitalization at [facility] for multifocal pneumonia-left AMA-presented with chest tightness/shortness of breath-she was found to have sepsis due to multifocal pneumonia-and subsequently blood cultures positive for MRSA.   Significant events: 1/11>> admit to [facility]-sepsis-multifocal PNA-blood cultures subsequently positive for MRSA. 1/23>> pinpoint pupils/barely responsive-given Narcan-improved.  Suspicion for illicitly using narcotics in the hospital. 1/24>>seems to be withdrawing from narcotics-suboxone started 1/25>>Eloped off the unit   Significant studies: 1/11>> CT chest: Multifocal lung consolidation-greatest in the left side. 1/12>> TTE: EF 70-75%-no obvious vegetation 1/15>> MRI left shoulder: Moderate ill-defined fluid between the deltoid, infraspinatus and teres minor muscles.  No well-defined fluid collection identified. 1/15>> MRI left femur: Motion degraded  exam-nondiagnostic exam. 1/16>> MRI left hip: Mild to moderate fluid tracking along the anterior aspect of the left iliopsoas junction-nonspecific-may represent bursitis.   Significant microbiology data: 1/11>> COVID/influenza/RSV PCR: Negative 1/11>> blood culture: MRSA 1/13>> blood culture: MRSA 1/14>> blood culture: MRSA 1/15>> blood culture: MRSA 1/16>> blood culture: Negative   Procedures: 1/17>> TEE: No vegetation.   Consults: ID Orthopedics  Brief Hospital Course: Sepsis secondary to MRSA bacteremia/septic emboli to lungs Sepsis physiology resolved w IV antibiotics Had persistent bacteremia initially-however blood cultures since 1/16 remain negative.  Persistent bacteremia most likely due to underdosing of vancomycin. Was switched to Teflaro/daptomycin-ID planning to switch back to vancomycin beginning 1/23. TEE negative She stablizied clinically-no longer with hip pain/right flank pain/chest pain.   ID recommending 4 weeks of IV antibiotics from 1/16. She is not a candidate for outpatient IV antibiotics and will need to remain inpatient to complete 4 weeks of treatment. Unfortunately she kept refusing IV Vanco, and on 1/25-ID switched her back to Daptomycin. But patient ELOPED from the unit without even letting the nursing staff know that she was leaving.    History of polysubstance abuse/IVDU (UDS positive for cocaine/amphetamines) Counseled Please see notes from 7/89-FYB may have illicitly used other narcotics-she was unresponsive with pinpoint pupils and required Narcan. OxyContin was subsequently discontinued Due to withdrawal symptoms-she was started on Suboxone 1/24 Patient was aware that she will not be provided Suboxone on discharge-social worker has already provided her outpatient resources/Suboxone clinics in the area-she has been asked repeatedly to start making an appointment. On 1/25-she eloped-without letting the nursing staff know  Chronic HCV  infection Outpatient management   BMI: Estimated body mass index is 22.86 kg/m as calculated from the following:  Height as of this encounter: 5\' 2"  (1.575 m).   Weight as of this encounter: 56.7 kg.   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  ECHO TEE  Result Date: 11/25/2022    TRANSESOPHOGEAL ECHO REPORT   [facility] Date of Exam: 11/24/2022 Medical Rec #:  [id number]      Height:       62.0 in Accession #:    [id number]     Weight:       125.0 lb Date of Birth:  August 15, 1991      BSA:          1.566 m Patient Age:    31 years       BP:           126/82 mmHg Patient Gender: F              HR:           90 bpm. Exam Location:  Inpatient Procedure: Transesophageal Echo and Color Doppler Indications:     Bacteremia  History:         Patient has prior history of Echocardiogram examinations, most                  recent 11/19/2022. Signs/Symptoms:Bacteremia; Risk Factors:IV                  drug use.  Sonographer:     Cassandra Roberts Referring Phys:  Cassandra Roberts Diagnosing Phys: Cassandra Roberts PROCEDURE: After discussion of the risks and benefits of a TEE, an informed consent was obtained from the patient. The transesophogeal probe was passed without difficulty through the esophogus of the patient. Imaged were  obtained with the patient in a left lateral decubitus position. Sedation performed by different physician. The patient was monitored while under deep sedation. Anesthestetic sedation was provided intravenously by Anesthesiology: 169mg  of Propofol, 60mg  of Lidocaine. The patient's vital signs; including heart rate, blood pressure, and oxygen saturation; remained stable throughout the procedure. The patient developed no complications during the procedure.  IMPRESSIONS  1. Left ventricular ejection fraction, by estimation, is 60 to 65%. The left ventricle has normal function. The left ventricle has no regional wall motion abnormalities.  2. Right ventricular systolic function is normal. The right ventricular size is normal.  3. No left atrial/left atrial appendage thrombus was detected.  4. The mitral valve is normal in structure. Trivial mitral valve regurgitation. No evidence of mitral stenosis.  5. The aortic valve is normal in structure. Aortic valve regurgitation is not visualized. No aortic stenosis is present.  6. The inferior vena cava is normal in size with greater than 50% respiratory variability, suggesting right atrial pressure of 3 mmHg.  7. Evidence of atrial level shunting  detected by color flow Doppler. Agitated saline contrast bubble study was positive with shunting observed within 3-6 cardiac cycles suggestive of interatrial shunt. There is a small patent foramen ovale. Conclusion(s)/Recommendation(s): No evidence of vegetation/infective endocarditis on this transesophageael echocardiogram. FINDINGS  Left Ventricle: Left ventricular ejection fraction, by estimation, is 60 to 65%. The left ventricle has normal function. The left ventricle has no regional wall motion abnormalities. The left ventricular internal cavity size was normal in size. There is  no left ventricular hypertrophy. Right Ventricle: The right ventricular size is normal. No increase in right ventricular waKentuckyll Ihor GSalvadore Domwsystolic function is normal. Left Atrium: Left atrialKent[place] 25m ble to complete exam) 11/22/2022 FINDINGS: Bones: Mild pubic symphysis joint space narrowing and  peripheral osteophytosis. No acute fracture or avascular necrosis is seen within the visualized portions of the pelvis or either proximal femur. There is decreased T1 signal seen diffusely throughout the ilium, ischium, and pubis of the pelvis. This remains hyperintense to skeletal muscle. This is nonspecific but compatible with red marrow reconversion as can be seen in the settings of obesity, anemia, and/or smoking along with other etiologies. No cortical erosion is seen. Articular cartilage and labrum Articular cartilage: Mild thinning of the superior left acetabular and femoral head cartilage. Labrum: Lack of intra-articular fluid limits evaluation of the left acetabular labrum. No dedicated left hip axial fluid sensitive images were performed. No definitive labral tear is seen on the provided images. Joint or bursal effusion Joint effusion:  No joint effusion within either hip. Bursae: No trochanteric bursitis on either side. Muscles and tendons Muscles and tendons: The origins of the bilateral rectus femoris and common hamstring tendons are intact. The insertions of the bilateral gluteus minimus, gluteus medius, and iliopsoas tendons are intact. There is mild-to-moderate fluid tracking along the anterior aspect of the left iliopsoas junction, possibly within the left iliopsoas bursa (axial series 5, image 21, left hip coronal series 7, image 19.  No definite enhancement in this region. This is nonspecific and may represent iliopsoas bursitis, however this edema does extend more superiorly, superficial to the anterior aspect of the left iliacus muscle (axial series 5 images 1 through 12, and in this region there is thin peripheral enhancement and possible multiple tiny pockets of rim enhancing fluid that could represent tiny abscesses (axial series 10 images 4 through 16). Recommend clinical correlation. There is also decreased T1 increased T2 signal likely fluid with multiple thin internal septations and some  internal loculated components, especially anteriorly (coronal series 3, images 7 through 14 and axial series 5 images 12 through 16) centered deep to the right pelvic sidewall musculature. No postcontrast images were performed including this region. Given the indication, it is difficult to exclude an abscess in this region. The dominant portion of this measures up to approximately 1.6 x 3.3 x 4.4 cm (transverse by AP by craniocaudal). This process also extends mildly more inferiorly and anteriorly, just deep to the inferior aspect of the right superior pubic ramus (axial series 5 images 16 through 19, coronal series 3 images 13 through 16). Other findings Miscellaneous: Mild free fluid within the pelvis, within normal limits for a premenopausal female. Normal physiologic follicles are seen within the bilateral ovaries. IMPRESSION: 1. There is mild-to-moderate fluid tracking along the anterior aspect of the left iliopsoas junction, possibly within the left iliopsoas bursa. No definite peripheral enhancement in this region. This is nonspecific and may represent iliopsoas bursitis, however this mild fluid/edema does extend more superiorly, superficial to the anterior aspect of the left iliacus muscle. There is thin peripheral enhancement and possible multiple tiny pockets of rim enhancing fluid that could represent tiny abscesses anterior to the left iliacus muscle. 2. Additional fluid signal with multiple thin internal septations and loculations centered in between the right acetabulum and the right pelvic sidewall musculature. Noting the indication for this study, MRI cannot determine whether this fluid is infected. 3. No bone cortical erosion or marrow edema to indicate evidence of acute osteomyelitis. Electronically Signed   By: Neita Garnet M.D.   On: 11/23/2022 11:58   MR FEMUR LEFT WO CONTRAST  Result Date: 11/22/2022 CLINICAL DATA:  Limping, acute thigh pain EXAM: MR OF THE LEFT FEMUR WITHOUT CONTRAST  TECHNIQUE: Multiplanar, multisequence MR imaging of the left was performed. No intravenous contrast was administered. COMPARISON:  None Available. FINDINGS: The exam is significantly limited, only coronal T2 and T1 sequences were obtained. Patient motion limits evaluation IMPRESSION: Significantly limited exam due to patient motion. The exam is only coronal T2 and T1 sequences were obtained. Nondiagnostic examination. Electronically Signed   By: Larose Hires D.O.   On: 11/22/2022 15:33   MR SHOULDER LEFT WO CONTRAST  Result Date: 11/22/2022 CLINICAL DATA:  Shoulder pain. Septic arthritis suspected. History of polysubstance abuse and intravenous drug use. Probable pulmonary septic emboli on chest CT. EXAM: MRI OF THE LEFT SHOULDER WITHOUT CONTRAST TECHNIQUE: Multiplanar, multisequence MR imaging of the shoulder was performed. No intravenous contrast was administered. COMPARISON:  Chest CT 11/18/2022.  CT left elbow 03/23/2020. FINDINGS: Rotator cuff:  Intact without significant tendinosis. Muscles: No focal muscular atrophy. There is a moderate amount of ill-defined fluid posteriorly between the deltoid, infraspinatus and teres minor muscles. Edema extends into the deltoid muscle. No well-defined focal fluid collections are identified. Biceps long head:  Intact and normally positioned. Acromioclavicular Joint: The acromion is type 2. No significant abnormality of the acromioclavicular joint. No generalized fluid in the subacromial-subdeltoid  bursa. Glenohumeral Joint: No significant shoulder joint effusion or glenohumeral arthropathy. Labrum: Labral assessment limited by the lack of joint fluid. No evidence of labral tear or paralabral cyst. Bones: No evidence of acute fracture, dislocation or osteomyelitis. No erosive changes are identified. Other: As above, posterior intramuscular and intermuscular ill-defined fluid which could be infected. No focal fluid collection identified. IMPRESSION: 1. Moderate  ill-defined fluid posteriorly between the deltoid, infraspinatus and teres minor muscles which could be infected. No well-defined focal fluid collection identified. 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 2. No evidence of septic arthritis or osteomyelitis. 3. The rotator cuff, biceps tendon and labrum appear intact. Electronically Signed   By: Carey Bullocks M.D.   On: 11/22/2022 14:25   ECHOCARDIOGRAM COMPLETE  Result Date: 11/19/2022    ECHOCARDIOGRAM REPORT   Patient Name:   Cassandra Roberts [facility] Date of Exam: 11/19/2022 Medical Rec #:  [id number]      Height:       62.0 in Accession #:    [id number]     Weight:       125.0 lb Date of Birth:  1991-10-22      BSA:          1.566 m Patient Age:    31 years       BP:           106/72 mmHg Patient Gender: F              HR:           110 bpm. Exam Location:  Inpatient Procedure: 2D Echo, Cardiac Doppler and Color Doppler Indications:    [place] R01.1  History:        Patient has no prior history of Echocardiogram examinations.                 Arrythmias:Tachycardia; Risk Factors:Current Smoker.  Sonographer:    Lucendia Herrlich Referring Phys: [phone] ABRAHAM FELIZ ORTIZ IMPRESSIONS  1. Left ventricular ejection fraction, by estimation, is 70 to 75%. The left ventricle has hyperdynamic function. The left ventricle has no regional wall motion abnormalities. Left ventricular diastolic parameters were normal.  2. Right ventricular systolic function is normal. The right ventricular size is normal. Tricuspid regurgitation signal is inadequate for assessing PA pressure.  3. The mitral valve is grossly normal. Trivial mitral valve regurgitation. No evidence of mitral stenosis.  4. The aortic valve is tricuspid. Aortic valve regurgitation is not visualized. No aortic stenosis is present.  5. The inferior vena cava is normal in size with <50% respiratory variability, suggesting right atrial pressure of 8 mmHg. Conclusion(s)/Recommendation(s): Normal biventricular function without  evidence of hemodynamically significant valvular heart disease. FINDINGS  Left Ventricle: Left ventricular ejection fraction, by estimation, is 70 to 75%. The left ventricle has hyperdynamic function. The left ventricle has no regional wall motion abnormalities. The left ventricular internal cavity size was normal in size. There is no left ventricular hypertrophy. Left ventricular diastolic parameters were normal. Right Ventricle: The right ventricular size is normal. No increase in right ventricular wall thickness. Right ventricular systolic function is normal. Tricuspid regurgitation signal is inadequate for assessing PA pressure. Left Atrium: Left atrial size was normal in size. Right Atrium: Right atrial size was normal in size. Pericardium: There is no evidence of pericardial effusion. Mitral Valve: The mitral valve is grossly normal. Trivial mitral valve regurgitation. No evidence of mitral valve stenosis. Tricuspid Valve: The tricuspid valve is grossly normal. Tricuspid valve regurgitation is trivial. No evidence of tricuspid stenosis. Aortic  Valve: The aortic valve is tricuspid. Aortic valve regurgitation is not visualized. No aortic stenosis is present. Aortic valve mean gradient measures 6.0 mmHg. Aortic valve peak gradient measures 11.2 mmHg. Aortic valve area, by VTI measures 2.09  cm. Pulmonic Valve: The pulmonic valve was grossly normal. Pulmonic valve regurgitation is not visualized. No evidence of pulmonic stenosis. Aorta: The aortic root and ascending aorta are structurally normal, with no evidence of dilitation. Venous: The right lower pulmonary vein is normal. The inferior vena cava is normal in size with less than 50% respiratory variability, suggesting right atrial pressure of 8 mmHg. IAS/Shunts: The atrial septum is grossly normal.  LEFT VENTRICLE PLAX 2D LVIDd:         3.90 cm   Diastology LVIDs:         2.60 cm   LV e' medial:    8.38 cm/s LV PW:         0.90 cm   LV E/e' medial:  12.9 LV  IVS:        0.90 cm   LV e' lateral:   9.25 cm/s LVOT diam:     1.80 cm   LV E/e' lateral: 11.7 LV SV:         53 LV SV Index:   34 LVOT Area:     2.54 cm  RIGHT VENTRICLE             IVC RV Basal diam:  3.30 cm     IVC diam: 1.80 cm RV S prime:     13.40 cm/s TAPSE (M-mode): 2.4 cm LEFT ATRIUM             Index        RIGHT ATRIUM           Index LA diam:        3.20 cm 2.04 cm/m   RA Area:     15.90 cm LA Vol (A2C):   12.0 ml 7.67 ml/m   RA Volume:   39.50 ml  25.23 ml/m LA Vol (A4C):   27.4 ml 17.50 ml/m LA Biplane Vol: 18.9 ml 12.07 ml/m  AORTIC VALVE AV Area (Vmax):    2.20 cm AV Area (Vmean):   2.14 cm AV Area (VTI):     2.09 cm AV Vmax:           167.00 cm/s AV Vmean:          116.000 cm/s AV VTI:            0.255 m AV Peak Grad:      11.2 mmHg AV Mean Grad:      6.0 mmHg LVOT Vmax:         144.50 cm/s LVOT Vmean:        97.750 cm/s LVOT VTI:          0.209 m LVOT/AV VTI ratio: 0.82  AORTA Ao Root diam: 2.80 cm Ao Asc diam:  2.80 cm MITRAL VALVE MV Area (PHT): 5.31 cm     SHUNTS MV Decel Time: 143 msec     Systemic VTI:  0.21 m MV E velocity: 108.00 cm/s  Systemic Diam: 1.80 cm MV A velocity: 99.40 cm/s MV E/A ratio:  1.09 Lennie Odor Roberts Electronically signed by Lennie Odor Roberts Signature Date/Time: 11/19/2022/10:39:33 AM    Final    CT Chest Wo Contrast  Result Date: 11/18/2022 CLINICAL DATA:  Pneumonia.  History of IV drug use. EXAM: CT CHEST WITHOUT CONTRAST TECHNIQUE: Multidetector CT  imaging of the chest was performed following the standard protocol without IV contrast. RADIATION DOSE REDUCTION: This exam was performed according to the departmental dose-optimization program which includes automated exposure control, adjustment of the mA and/or kV according to patient size and/or use of iterative reconstruction technique. COMPARISON:  Chest CTA 10/30/2022 FINDINGS: Cardiovascular: Normal caliber of the thoracic aorta. Normal heart size. No pericardial effusion. Mediastinum/Nodes: No  enlarged axillary lymph nodes. Limited assessment for mediastinal and hilar lymphadenopathy due to the absence of IV contrast and paucity of mediastinal fat. Unremarkable included thyroid. Nondistended esophagus. Lungs/Pleura: New small left and trace right pleural effusions. No pneumothorax. Much of the patchy ground-glass and consolidative opacity on the prior CT has resolved, however there is new more extensive consolidation in the left lower lobe, inferior lingula, and anteromedial left upper lobe. There also multiple new small peripheral nodular opacities in the upper lobes, right middle lobe, and right lower lobe, some of which demonstrate surrounding ground-glass density and with at least one demonstrating a small focus of central cavitation (series 6, image 48 in the right upper lobe). Upper Abdomen: No acute abnormality. Musculoskeletal: No acute osseous abnormality or suspicious osseous lesion. IMPRESSION: 1. Multifocal lung consolidation consistent with pneumonia, greatest in the left lower lobe and left upper lobe/lingula. Bilateral peripheral nodules (with at least 1 being cavitary) raise the possibility of septic emboli. 2. New small left and trace right pleural effusions. Electronically Signed   By: Sebastian AcheAllen  Grady M.D.   On: 11/18/2022 14:33   DG Chest 2 View  Result Date: 11/18/2022 CLINICAL DATA:  Shortness of breath. EXAM: CHEST - 2 VIEW COMPARISON:  October 30, 2022. FINDINGS: Stable cardiomediastinal silhouette. Left basilar atelectasis or pneumonia is noted with associated pleural effusion. Minimal right basilar subsegmental atelectasis or infiltrate is noted. Bony thorax is unremarkable. IMPRESSION: Left basilar atelectasis or pneumonia is noted with associated pleural effusion. Minimal right basilar subsegmental atelectasis or infiltrate. Electronically Signed   By: Lupita RaiderJames  Green Jr M.D.   On: 11/18/2022 10:15     TODAY-DAY OF DISCHARGE:  Subjective:   Cassandra Roberts today ELOPED from  the unit without letting the nursing staff know that she was leaving the unit.  Objective:   Blood pressure 117/69, pulse 62, temperature 99 F (37.2 C), temperature source Oral, resp. rate 18, height 5\' 2"  (1.575 m), weight 56.7 kg, last menstrual period 11/04/2022, SpO2 95 %. No intake or output data in the 24 hours ending 12/02/22 1910 Filed Weights   11/18/22 0805 11/24/22 1108  Weight: 56.7 kg 56.7 kg       PERTINENT RADIOLOGIC STUDIES: No results found.   PERTINENT LAB RESULTS: CBC: Recent Labs    11/30/22 1200  WBC 13.1*  HGB 10.8*  HCT 32.9*  PLT 573*   CMET CMP     Component Value Date/Time   NA 133 (L) 11/30/2022 1200   K 4.4 11/30/2022 1200   CL 97 (L) 11/30/2022 1200   CO2 25 11/30/2022 1200   GLUCOSE 132 (H) 11/30/2022 1200   BUN 11 11/30/2022 1200   CREATININE 0.67 11/30/2022 1200   CALCIUM 8.8 (L) 11/30/2022 1200   PROT 5.6 (L) 11/19/2022 0316   ALBUMIN 2.3 (L) 11/19/2022 0316   AST 19 11/19/2022 0316   ALT 42 11/19/2022 0316   ALKPHOS 162 (H) 11/19/2022 0316   BILITOT 0.9 11/19/2022 0316   GFRNONAA >60 11/30/2022 1200   GFRAA >60 03/24/2020 0647    GFR Estimated Creatinine Clearance: 80.6 mL/min (by C-G  formula based on SCr of 0.67 mg/dL). No results for input(s): "LIPASE", "AMYLASE" in the last 72 hours. Recent Labs    11/30/22 1200  CKTOTAL 33*   Invalid input(s): "POCBNP" No results for input(s): "DDIMER" in the last 72 hours. No results for input(s): "HGBA1C" in the last 72 hours. No results for input(s): "CHOL", "HDL", "LDLCALC", "TRIG", "CHOLHDL", "LDLDIRECT" in the last 72 hours. No results for input(s): "TSH", "T4TOTAL", "T3FREE", "THYROIDAB" in the last 72 hours.  Invalid input(s): "FREET3" No results for input(s): "VITAMINB12", "FOLATE", "FERRITIN", "TIBC", "IRON", "RETICCTPCT" in the last 72 hours. Coags: No results for input(s): "INR" in the last 72 hours.  Invalid input(s): "PT" Microbiology: Recent Results (from the  past 240 hour(s))  Culture, blood (Routine X 2) w Reflex to ID Panel     Status: None   Collection Time: 11/23/22  1:03 PM   Specimen: BLOOD LEFT ARM  Result Value Ref Range Status   Specimen Description BLOOD LEFT ARM  Final   Special Requests   Final    BOTTLES DRAWN AEROBIC ONLY Blood Culture adequate volume   Culture   Final    NO GROWTH 5 DAYS Performed at [facility], [address], [place], [place] [address]    Report Status 11/28/2022 FINAL  Final  Culture, blood (Routine X 2) w Reflex to ID Panel     Status: None   Collection Time: 11/23/22  1:04 PM   Specimen: BLOOD LEFT ARM  Result Value Ref Range Status   Specimen Description BLOOD LEFT ARM  Final   Special Requests   Final    BOTTLES DRAWN AEROBIC ONLY Blood Culture adequate volume   Culture   Final    NO GROWTH 5 DAYS Performed at [facility], [address] [place], [place] [address]    Report Status 11/28/2022 FINAL  Final    FURTHER DISCHARGE INSTRUCTIONS:  Get Medicines reviewed and adjusted: Please take all your medications with you for your next visit with your Primary Roberts  Laboratory/radiological data: Please request your Primary Roberts to go over all hospital tests and procedure/radiological results at the follow up, please ask your Primary Roberts to get all Hospital records sent to his/her office.  In some cases, they will be blood work, cultures and biopsy results pending at the time of your discharge. Please request that your primary care M.D. goes through all the records of your hospital data and follows up on these results.  Also Note the following: If you experience worsening of your admission symptoms, develop shortness of breath, life threatening emergency, suicidal or homicidal thoughts you must seek medical attention immediately by calling 911 or calling your Roberts immediately  if symptoms less severe.  You must read complete instructions/literature along with all the possible adverse  reactions/side effects for all the Medicines you take and that have been prescribed to you. Take any new Medicines after you have completely understood and accpet all the possible adverse reactions/side effects.   Do not drive when taking Pain medications or sleeping medications (Benzodaizepines)  Do not take more than prescribed Pain, Sleep and Anxiety Medications. It is not advisable to combine anxiety,sleep and pain medications without talking with your primary care practitioner  Special Instructions: If you have smoked or chewed Tobacco  in the last 2 yrs please stop smoking, stop any regular Alcohol  and or any Recreational drug use.  Wear Seat belts while driving.  Please note: You were cared for by  a hospitalist during your hospital stay. Once you are discharged, your primary care physician will handle any further medical issues. Please note that NO REFILLS for any discharge medications will be authorized once you are discharged, as it is imperative that you return to your primary care physician (or establish a relationship with a primary care physician if you do not have one) for your post hospital discharge needs so that they can reassess your need for medications and monitor your lab values.  Total Time spent coordinating discharge including counseling, education and face to face time equals greater than 30 minutes.  Signed: Oren Binet 12/02/2022 7:10 PM

## 2022-12-02 NOTE — Progress Notes (Signed)
0115- Pt refused vanc trough to be drawn by phlebotomy. Pt states they drew her blood the previous night and doesn't understand why they need to draw it again. Pt spoken to by this RN and educated on reasoning for lab draw. Pt still refused blood work.   I then called and spoke to pharmacy regarding vanc trough refusal, and was told it is still okay to go ahead with 00:00 vanc dose. Will try to vanc trough at a later time.   Pt then later refused vancomycin dose stating it was burning her arm upon infusion. Pt turned off IV pump without permission of staff. Pt educated on not managing/turning off IV pump and to ask for staff assistance. IV was flushed with NS with no issue or signs of leaking/infiltration and patient denied pain with flushing but still refused dose.

## 2022-12-02 NOTE — Progress Notes (Signed)
Attempted to see patient, she told she wants to be a alone and not bothered.   RR called on 1/23 for being lethargic and pin point pupils with concerns for ? Illicit substance use and now concerns for Opioid withdrawal  Patient has been refusing blood draws for Vancomycin trough levels. OK to switch to Daptomycin in place of Vancomycin. She has received adequate pulmonary coverage for MRSA.   Will see her tomorrow.  D/w ID Pharm D

## 2022-12-02 NOTE — Progress Notes (Signed)
Pt refused vancomycin drip.Shuts the IV pump herself and ripped the IV drip out.MD and pharmacist aware.

## 2022-12-02 NOTE — Progress Notes (Signed)
Patient seen leaving the unit at appx [phone]. Elopement suspected. Security notified and description of patient given. Dr. Oren Binet notified.

## 2022-12-02 NOTE — Progress Notes (Signed)
Pharmacy Antibiotic Note  Cassandra Roberts is a 32 y.o. female admitted on 11/18/2022 with MRSA bacteremia, concern for pulmonary septic emboli, and disseminated infection. Pharmacy has been consulted for Vancomycin dosing.   [name] keeps refusing antibiotic doses of vancomycin and ripping out her IV lines. Will change her antibiotic regimen to daptomycin.   Last CK 33 on 1/23   NOTE: This patient has linezolid stored in main pharmacy in the event she leaves AMA.   Plan: Linezolid 600 mg PO x1, then  Daptomycin 500 mg IV Q2000 CK weekly if she stays on daptomycin (next due 1/30)   Height: 5\' 2"  (157.5 cm) Weight: 56.7 kg (125 lb) IBW/kg (Calculated) : 50.1  Temp (24hrs), Avg:98.7 F (37.1 C), Min:97.9 F (36.6 C), Max:99.1 F (37.3 C)  Recent Labs  Lab 11/28/22 0728 11/30/22 1200 12/01/22 1748  WBC 12.5* 13.1*  --   CREATININE 0.62 0.67  --   VANCOPEAK  --   --  39     Estimated Creatinine Clearance: 80.6 mL/min (by C-G formula based on SCr of 0.67 mg/dL).    Allergies  Allergen Reactions   Dextromethorphan     Shaking    Antimicrobials this admission: Cefepime 1/11 x 1 Vanc 1/11 >>1/16; 1/23>>1/24 Daptomycin 1/16>>1/22; 1/25>>c  Ceftaroline 1/16>>1/22  Linezolid x1 1/25  Dose adjustments this admission: *1/16 levels 26/14 >> AUC 519 on 1g/8h (SCr range 0.5-0.6)   Microbiology results: 1/11 BCx >> 1/2 MRSA 1/12 BCx >> ngx3d 1/13 BCx 2/2 MRSA 1/14 BCx >> 3/3 MRSA 1/15 BCx >>MRSA (drawn prior to vanc dose optimization) 1/16 Bcx: negF   Thank you for allowing pharmacy to be a part of this patient's care.  Adria Dill, PharmD PGY-2 Infectious Diseases Resident  12/02/2022 3:34 PM

## 2022-12-03 ENCOUNTER — Other Ambulatory Visit (HOSPITAL_COMMUNITY): Payer: Self-pay

## 2022-12-03 LAB — DRUG SCREEN 10 W/CONF, SERUM
Amphetamines, IA: NEGATIVE ng/mL
Barbiturates, IA: NEGATIVE ug/mL
Benzodiazepines, IA: NEGATIVE ng/mL
Cocaine & Metabolite, IA: NEGATIVE ng/mL
Methadone, IA: NEGATIVE ng/mL
Opiates, IA: POSITIVE ng/mL — AB
Oxycodones, IA: NEGATIVE ng/mL
Phencyclidine, IA: NEGATIVE ng/mL
Propoxyphene, IA: NEGATIVE ng/mL
THC(Marijuana) Metabolite, IA: NEGATIVE ng/mL

## 2022-12-03 LAB — OPIATES,MS,WB/SP RFX
6-Acetylmorphine: NEGATIVE
Codeine: NEGATIVE ng/mL
Dihydrocodeine: NEGATIVE ng/mL
Hydrocodone: NEGATIVE ng/mL
Hydromorphone: 3.2 ng/mL
Morphine: 6.6 ng/mL
Opiate Confirmation: POSITIVE

## 2022-12-03 LAB — OXYCODONES,MS,WB/SP RFX
Oxycocone: NEGATIVE ng/mL
Oxycodones Confirmation: NEGATIVE
Oxymorphone: NEGATIVE ng/mL

## 2023-01-10 ENCOUNTER — Emergency Department (HOSPITAL_COMMUNITY)
Admission: EM | Admit: 2023-01-10 | Discharge: 2023-01-10 | Disposition: A | Discharge status: Home / Self Care | Payer: Medicaid Other | Attending: Emergency Medicine | Admitting: Emergency Medicine

## 2023-01-10 ENCOUNTER — Other Ambulatory Visit (HOSPITAL_COMMUNITY): Payer: Self-pay

## 2023-01-10 ENCOUNTER — Other Ambulatory Visit: Payer: Self-pay

## 2023-01-10 ENCOUNTER — Emergency Department (HOSPITAL_COMMUNITY): Payer: Medicaid Other

## 2023-01-10 ENCOUNTER — Encounter (HOSPITAL_COMMUNITY): Payer: Self-pay | Admitting: Emergency Medicine

## 2023-01-10 DIAGNOSIS — D594 Other nonautoimmune hemolytic anemias: Secondary | ICD-10-CM | POA: Insufficient documentation

## 2023-01-10 DIAGNOSIS — L03116 Cellulitis of left lower limb: Secondary | ICD-10-CM

## 2023-01-10 DIAGNOSIS — M86141 Other acute osteomyelitis, right hand: Secondary | ICD-10-CM | POA: Insufficient documentation

## 2023-01-10 DIAGNOSIS — M869 Osteomyelitis, unspecified: Secondary | ICD-10-CM

## 2023-01-10 DIAGNOSIS — M79641 Pain in right hand: Secondary | ICD-10-CM | POA: Diagnosis present

## 2023-01-10 HISTORY — DX: Carrier or suspected carrier of methicillin resistant Staphylococcus aureus: Z22.322

## 2023-01-10 LAB — BASIC METABOLIC PANEL
Anion gap: 10 (ref 5–15)
BUN: 15 mg/dL (ref 6–20)
CO2: 28 mmol/L (ref 22–32)
Calcium: 9.2 mg/dL (ref 8.9–10.3)
Chloride: 99 mmol/L (ref 98–111)
Creatinine, Ser: 0.73 mg/dL (ref 0.44–1.00)
GFR, Estimated: >60 mL/min (ref >60)
Glucose, Bld: 157 mg/dL — ABNORMAL HIGH (ref 70–99)
Potassium: 3.7 mmol/L (ref 3.5–5.1)
Sodium: 137 mmol/L (ref 135–145)

## 2023-01-10 LAB — CBC WITH DIFFERENTIAL/PLATELET
Abs Immature Granulocytes: 0.02 10*3/uL (ref 0.00–0.07)
Basophils Absolute: 0 10*3/uL (ref 0.0–0.1)
Basophils Relative: 1 %
Eosinophils Absolute: 0.3 10*3/uL (ref 0.0–0.5)
Eosinophils Relative: 4 %
HCT: 34.2 % — ABNORMAL LOW (ref 36.0–46.0)
Hemoglobin: 10.9 g/dL — ABNORMAL LOW (ref 12.0–15.0)
Immature Granulocytes: 0 %
Lymphocytes Relative: 24 %
Lymphs Abs: 1.6 10*3/uL (ref 0.7–4.0)
MCH: 27.2 pg (ref 26.0–34.0)
MCHC: 31.9 g/dL (ref 30.0–36.0)
MCV: 85.3 fL (ref 80.0–100.0)
Monocytes Absolute: 0.5 10*3/uL (ref 0.1–1.0)
Monocytes Relative: 7 %
Neutro Abs: 4.2 10*3/uL (ref 1.7–7.7)
Neutrophils Relative %: 64 %
Platelets: 426 10*3/uL — ABNORMAL HIGH (ref 150–400)
RBC: 4.01 MIL/uL (ref 3.87–5.11)
RDW: 13.2 % (ref 11.5–15.5)
WBC: 6.6 10*3/uL (ref 4.0–10.5)
nRBC: 0 % (ref 0.0–0.2)

## 2023-01-10 LAB — LACTIC ACID, PLASMA
Lactic Acid, Venous: 1.4 mmol/L (ref 0.5–1.9)
Lactic Acid, Venous: 1.3 mmol/L (ref 0.5–1.9)

## 2023-01-10 MED ORDER — DEXTROSE 5 % IV SOLN
1500.0000 mg | Freq: Once | INTRAVENOUS | Status: AC
  Administered 2023-01-10: 1500 mg via INTRAVENOUS
  Filled 2023-01-10: qty 75

## 2023-01-10 NOTE — Discharge Instructions (Addendum)
Your imaging shows that you have an infection in your bone, I am concerned that the infection in your bone might of been caused by a blood infection which in the life-threatening and/or limb-threatening as your symptoms can progressively get worse quickly.  I highly recommend that you follow-up with infectious disease for further evaluation, and/or Aurex experiences symptoms which are not limited to worsening right hand pain, swelling, numbness, weakness, chest pain, shortness of breath, fevers or chills.

## 2023-01-10 NOTE — ED Provider Notes (Signed)
[facility] Provider Note   CSN: [id number] Arrival date & time: 01/10/23  [id number]     History  Chief Complaint  Patient presents with   Hand Problem    Cassandra Roberts is a 32 y.o. female.  HPI   Medical history including IV drug dependency, presenting with complaints of right hand pain, patient started about 1 week ago, came on suddenly, pain is just in her right hand, has gotten worse, she has noted worsening swelling in that hand, she states she is unable to fully open or close it due to pain, no associated fevers chills no associate chest pain shortness of breath not endorsing any leg swelling.  She does admit to IV drug use most recently earlier today, she has no other complaints.  I reviewed patient's chart was recently seen in January for multifocal pneumonia, blood cultures positive for MRSA, recommended by ID 4 weeks of IV vancomycin which was then switched to daptomycin as patient was refusing IV draws.  Unfortunately eloped prior to the completion of IV antibiotics.  After reviewing ID note was recommended that she had a total of 4 weeks of IV vancomycin and then discharged home on oral dalbavancin for the next 6 weeks.  Home Medications Prior to Admission medications   Medication Sig Start Date End Date Taking? Authorizing Provider  acetaminophen (TYLENOL) 325 MG tablet Take 325 mg by mouth every 6 (six) hours as needed for fever.    [provider]  benzonatate (TESSALON) 100 MG capsule Take 1 capsule (100 mg total) by mouth every 8 (eight) hours. Patient not taking: Reported on 03/23/2020 07/02/15   Debby Freiberg, MD  doxycycline (VIBRAMYCIN) 100 MG capsule Take 1 capsule (100 mg total) by mouth 2 (two) times daily. One po bid x 7 days Patient not taking: Reported on 03/23/2020 12/28/19   Sherwood Gambler, MD  ibuprofen (ADVIL) 200 MG tablet Take 200 mg by mouth every 6 (six) hours as needed for moderate pain.    [provider]  ibuprofen (ADVIL,MOTRIN) 800 MG tablet Take 1 tablet (800 mg total) by mouth 3 (three) times daily. Patient not taking: Reported on 06/15/2015 10/22/13   Montine Circle, PA-C  metroNIDAZOLE (FLAGYL) 500 MG tablet Take 1 tablet (500 mg total) by mouth 2 (two) times daily. One po bid x 7 days Patient not taking: Reported on 03/23/2020 07/02/15   Debby Freiberg, MD  naloxone [facility]) 1 MG/ML injection Inject 0.4 mLs (0.4 mg total) into the vein as needed. Patient not taking: Reported on 03/23/2020 06/15/15   Randal Buba, April, MD      Allergies    Dextromethorphan    Review of Systems   Review of Systems  Constitutional:  Negative for chills and fever.  Respiratory:  Negative for shortness of breath.   Cardiovascular:  Negative for chest pain.  Gastrointestinal:  Negative for abdominal pain.  Musculoskeletal:        Right hand pain  Neurological:  Negative for headaches.    Physical Exam Updated Vital Signs BP 114/77   Pulse 98   Temp 98.1 F (36.7 C) (Oral)   Resp 19   Ht '5\' 2"'$  (1.575 m)   Wt 56.7 kg   LMP 12/14/2022 (Approximate)   SpO2 99%   BMI 22.86 kg/m  Physical Exam Vitals and nursing note reviewed.  Constitutional:      General: She is not in acute distress.    Appearance: She is not ill-appearing.  HENT:     Head: Normocephalic and atraumatic.     Nose: No congestion.  Eyes:     Conjunctiva/sclera: Conjunctivae normal.  Cardiovascular:     Rate and Rhythm: Normal rate and regular rhythm.     Pulses: Normal pulses.     Heart sounds: No murmur heard.    No friction rub. No gallop.  Pulmonary:     Effort: No respiratory distress.     Breath sounds: No wheezing, rhonchi or rales.  Musculoskeletal:     Comments: Focused exam of the right upper extremity reveals significant edema of the right hand, no overlying skin changes, patient is unable to fully flex or extend at the fingers, when fingers are fully extended elicits significant pain, hand was warm  to the touch, 2-second capillary refill, 2+ radial pulses, no noted redness tracking up the arm.  Patient had no unilateral leg swelling.  Skin:    General: Skin is warm and dry.  Neurological:     Mental Status: She is alert.  Psychiatric:        Mood and Affect: Mood normal.        ED Results / Procedures / Treatments   Labs (all labs ordered are listed, but only abnormal results are displayed) Labs Reviewed  CBC WITH DIFFERENTIAL/PLATELET - Abnormal; Notable for the following components:      Result Value   Hemoglobin 10.9 (*)    HCT 34.2 (*)    Platelets 426 (*)    All other components within normal limits  BASIC METABOLIC PANEL - Abnormal; Notable for the following components:   Glucose, Bld 157 (*)    All other components within normal limits  CULTURE, BLOOD (ROUTINE X 2)  CULTURE, BLOOD (ROUTINE X 2)  LACTIC ACID, PLASMA  LACTIC ACID, PLASMA  LACTIC ACID, PLASMA    EKG None  Radiology DG Hand Complete Right  Result Date: 01/10/2023 CLINICAL DATA:  Right hand pain and swelling, no known injury, initial encounter EXAM: RIGHT HAND - COMPLETE 3+ VIEW COMPARISON:  None Available. FINDINGS: Changes consistent with prior fifth metacarpal fracture with healing are seen. Generalized soft tissue swelling is noted. No acute fracture is seen. There are some erosive changes identified medially in the hamate. Given the soft tissue swelling and clinical history this may represent underlying osteomyelitis. IMPRESSION: Generalized soft tissue swelling and erosive changes in the medial aspect of hamate suspicious for osteomyelitis. Electronically Signed   By: Inez Catalina M.D.   On: 01/10/2023 03:09    Procedures Procedures    Medications Ordered in ED Medications  dalbavancin (DALVANCE) 1,500 mg in dextrose 5 % 500 mL IVPB (1,500 mg Intravenous New Bag/Given 01/10/23 [id number])    ED Course/ Medical Decision Making/ A&P                             Medical Decision Making Amount  and/or Complexity of Data Reviewed Labs: ordered. Radiology: ordered.   This patient presents to the ED for concern of right hand pain, this involves an extensive number of treatment options, and is a complaint that carries with it a high risk of complications and morbidity.  The differential diagnosis includes cellulitis, fracture, dislocation, osteomyelitis    Additional history obtained:  Additional history obtained from N/A External records from outside source obtained and reviewed including recent ER notes, infectious disease notes   Co morbidities that complicate the patient evaluation  Polysubstance dependency  Social  Determinants of Health:  No primary care provider    Lab Tests:  I Ordered, and personally interpreted labs.  The pertinent results include: CBC shows normocytic anemia hemoglobin 10.9, BMP shows glucose of 157, lactic 1.4, blood cultures pending.   Imaging Studies ordered:  I ordered imaging studies including x-ray of the right hand I independently visualized and interpreted imaging which showed signs concerning for osteomyelitis I agree with the radiologist interpretation   Cardiac Monitoring:  The patient was maintained on a cardiac monitor.  I personally viewed and interpreted the cardiac monitored which showed an underlying rhythm of: N/A   Medicines ordered and prescription drug management:  I ordered medication including Dalvance I have reviewed the patients home medicines and have made adjustments as needed  Critical Interventions:  N/A   Reevaluation:  Presents with swelling of the right hand, concern for tenosynovitis, will obtain further lab work and continue to monitor.  Reassessed the patient, explained to her that I am concerned for possible bacteremia which could cause her osteomyelitis, and likely she needs to be admitted for continued IV antibiotics and treatment.  Patient states that she cannot that she needs to go home to  take care of things, I explained this can be life-threatening, she can become septic or possibly lose her arm, and things can develop quickly, states that she understands but still needs to go home.  Will speak with ID for recommendations of possible outpatient therapy  Updated patient on recommendations from ID she agreed on this plan, I explained to her this medication will not fix her problem but only slow it down, and likely things will get worse potentially losing her arm or become septic and passed away, she states that she still wants to leave.  Patient will is agreement with leaving AMA.    Consultations Obtained:  I requested consultation with the spoke with Dr. Linus Salmons of ID,  and discussed lab and imaging findings as well as pertinent plan - they recommend: Recommends providing patient with single dose of Dalvance this should help the patient for at least 1 to 2 weeks.    Test Considered:  Admission for continued treatment of potential bacteremia/osteomyelitis-patient is refusing and like to leave [facility].    Rule out  Low suspicion for fracture or dislocation as x-ray does not feel any significant findings.  Suspicion for endocarditis is low at this time not endorsing any chest pain shortness of breath, no noted peripheral edema, no noted heart murmurs on my exam.  Suspicion for DVT is lower at this time no palpable cords, presentation is atypical etiology seems more consistent with likely infectious etiology especially with the x-ray showing evidence of osteomyelitis.  Low suspicion for compartment syndrome as area was palpated it was soft to the touch, neurovascular fully intact.     Dispostion and problem list  After consideration of the diagnostic results and the patients response to treatment, I feel that the patent would benefit from [facility].  Right hand osteomyelitis-I am concerned that patient has possible bacteremia which cause osteomyelitis, explained that  this can be life or limb threatening, explained that if her symptoms worsen needs to come back in for immediate evaluation.            Final Clinical Impression(s) / ED Diagnoses Final diagnoses:  Osteomyelitis of right hand, unspecified type [facility])    Rx / DC Orders ED Discharge Orders          Ordered  Ambulatory referral to Infectious Disease       Comments: Cellulitis patient:  Received dalbavancin on 01/10/2023.   01/10/23 0522              Marcello Fennel, PA-C 01/10/23 [id number]    Orpah Greek, MD 01/10/23 [id number]

## 2023-01-10 NOTE — ED Triage Notes (Signed)
Pt c/o right hand swelling x 1 week, pt reports hx of abscesses and endorses IV drug use

## 2023-01-10 NOTE — ED Notes (Signed)
Ambulated pt in hall, pt had steady gait. Respirations 22 and unlaboured

## 2023-01-10 NOTE — Progress Notes (Signed)
Pharmacy Note:  Dalbavancin for Acute Bacterial Skin and Skin Structure Infection (ABSSSI) Patients to [facility] Discharge Cassandra Roberts is an 32 y.o. female who presented to [facility] on 01/10/2023 with an Acute Bacterial Skin and Skin Structure Infection  Inclusion criteria - Indication '[]'$  Moderately large skin lesion (>=75 cm2 or larger - about the size of a baseball) '[x]'$  Cellulitis  Inclusion Criteria - at least one SIRS criteria present '[]'$  WBC > 12,000 or < 4000 '[]'$  temp >100.9 or < 96.8 '[x]'$  heart rate >90'[x]'$  respiratory rate >20  Patient was evaluated for the following exclusion criteria and no exclusions were found  Hardware involvement, Hypotension / shock, Elevated lactate (>2) without other explanation, ram-negative infection risk factors (bites, water exposure, infection after trauma, infection after skin graft, neutropenia, burns, severe immunocompromise), necrotizing fasciitis possible or confirmed, Known or suspected osteomyelitis or septic arthritis, endocarditis, diabetic foot infection, ischemic ulcers, post-operative wound infection, perirectal infections, need for drainage in the operating room, hand or facial infections, injection drug users with a fever, bacteremia, pregnancy or breastfeeding, allergy to related antibiotics like vancomycin, known liver disease (t.bili >2x ULN or AST/ALT 3x ULN)  Cassandra Roberts [name] 01/10/2023, 5:24 AM Clinical Pharmacist

## 2023-01-10 NOTE — ED Notes (Signed)
Unable to obtain 2nd Blood Culture, MD notified.

## 2023-01-10 NOTE — ED Notes (Signed)
Pt requests paper RX if RX is needed

## 2023-01-15 LAB — CULTURE, BLOOD (ROUTINE X 2)
Culture: NO GROWTH
Special Requests: ADEQUATE

## 2023-07-12 ENCOUNTER — Ambulatory Visit: Payer: Self-pay | Admitting: Nurse Practitioner

## 2023-08-05 ENCOUNTER — Ambulatory Visit: Payer: Medicaid Other | Admitting: Nurse Practitioner

## 2023-08-05 ENCOUNTER — Encounter: Payer: Self-pay | Admitting: Nurse Practitioner

## 2023-08-05 VITALS — BP 117/77 | HR 97 | Temp 97.2°F | Ht 62.0 in | Wt 148.6 lb

## 2023-08-05 DIAGNOSIS — F1210 Cannabis abuse, uncomplicated: Secondary | ICD-10-CM

## 2023-08-05 DIAGNOSIS — Z720 Tobacco use: Secondary | ICD-10-CM | POA: Diagnosis not present

## 2023-08-05 DIAGNOSIS — L03116 Cellulitis of left lower limb: Secondary | ICD-10-CM

## 2023-08-05 DIAGNOSIS — N898 Other specified noninflammatory disorders of vagina: Secondary | ICD-10-CM

## 2023-08-05 DIAGNOSIS — F1910 Other psychoactive substance abuse, uncomplicated: Secondary | ICD-10-CM

## 2023-08-05 DIAGNOSIS — Z113 Encounter for screening for infections with a predominantly sexual mode of transmission: Secondary | ICD-10-CM

## 2023-08-05 MED ORDER — DOXYCYCLINE HYCLATE 100 MG PO CAPS: 100.0000 mg | ORAL_CAPSULE | Freq: Two times a day (BID) | ORAL | 0 refills | Status: DC

## 2023-08-05 NOTE — Assessment & Plan Note (Signed)
Smokes about less than 0.5 pack/day  Asked about quitting: confirms that he/she currently smokes cigarettes Advise to quit smoking: Educated about QUITTING to reduce the risk of cancer, cardio and cerebrovascular disease. Assess willingness: Unwilling to quit at this time, but is working on cutting back. Assist with counseling and pharmacotherapy: Counseled for 5 minutes and literature provided. Arrange for follow up: follow up in 1 month and continue to offer help.  

## 2023-08-05 NOTE — Assessment & Plan Note (Signed)
-   AMB referral to wound care center - Ambulatory referral to Infectious Disease - doxycycline (VIBRAMYCIN) 100 MG capsule; Take 1 capsule (100 mg total) by mouth 2 (two) times daily. One po bid x 7 days  Dispense: 14 capsule; Refill: 0

## 2023-08-05 NOTE — Assessment & Plan Note (Signed)
UA negative for UTI Vaginal culture is pending

## 2023-08-05 NOTE — Patient Instructions (Signed)
1. Tobacco abuse   2. Vaginal odor  - NuSwab Vaginitis Plus (VG+) - POCT URINALYSIS DIP (CLINITEK)  3. Polysubstance abuse (HCC)   4. Screening for STD (sexually transmitted disease)  - HIV Antibody (routine testing w rflx) - Hepatitis C antibody  5. Cellulitis of left foot  - AMB referral to wound care center - Ambulatory referral to Infectious Disease - doxycycline (VIBRAMYCIN) 100 MG capsule; Take 1 capsule (100 mg total) by mouth 2 (two) times daily. One po bid x 7 days  Dispense: 14 capsule; Refill: 0 - CBC - CMP14+EGFR    It is important that you exercise regularly at least 30 minutes 5 times a week as tolerated  Think about what you will eat, plan ahead. Choose " clean, green, fresh or frozen" over canned, processed or packaged foods which are more sugary, salty and fatty. 70 to 75% of food eaten should be vegetables and fruit. Three meals at set times with snacks allowed between meals, but they must be fruit or vegetables. Aim to eat over a 12 hour period , example 7 am to 7 pm, and STOP after  your last meal of the day. Drink water,generally about 64 ounces per day, no other drink is as healthy. Fruit juice is best enjoyed in a healthy way, by EATING the fruit.  Thanks for choosing [facility] we consider it a privelige to serve you.

## 2023-08-05 NOTE — Assessment & Plan Note (Signed)
Need to avoid use of illicit drugs discussed Patient verbalized understanding Today She refused information for drug rehab

## 2023-08-05 NOTE — Progress Notes (Unsigned)
New Patient Office Visit  Subjective:  Patient ID: Cassandra Roberts, female    DOB: Mar 15, 1991  Age: 32 y.o. MRN: [id number]  CC:  Chief Complaint  Patient presents with   Establish Care    HPI Cassandra Roberts is a 32 y.o. female  has a past medical history of Attention deficit disorder (ADD), Hepatitis C, MRSA (methicillin resistant staph aureus) culture positive, and Substance abuse (HCC).  Patient presents to establish care for her chronic medical conditions.  Has no PCP.  Patient complains of left foot wound and soreness.  She was on admission at the hospital in January for multifocal pneumonia.  Patient eloped prior to completion of IV antibiotics.  She was at the emergency department in March 2024 with swelling of right hand, there was a concern for possible bacteremia which could cause osteomyelitis and that she needed to be admitted but the patient declined admission.  ID recommended giving a single dose of Dalvance and that the patient should follow-up with them outpatient.  However the patient did not follow-up with ID.  She denies fever, chills, malaise.   Patient complains of vaginal odor for about a month.  She denies dysuria abdominal pain nausea vomiting.   Polysubstance abuse.  Patient  stated that she was at a drug rehab in [facility] sometime this year.  She denies the use of any other drugs other than marijuana at this time.   Tobacco use disorder.  States that 1 pack of cigarettes lasts her 3 days.  Started smoking at age 2.  She denies shortness of breath cough wheezing.     Past Medical History:  Diagnosis Date   Attention deficit disorder (ADD)    Hepatitis C    MRSA (methicillin resistant staph aureus) culture positive    Substance abuse (HCC)     Past Surgical History:  Procedure Laterality Date   APPENDECTOMY     MINOR IRRIGATION AND DEBRIDEMENT OF WOUND Left 03/23/2020   Procedure: MINOR IRRIGATION AND DEBRIDEMENT OF WOUND;  Surgeon: Kathryne Hitch, MD;  Location: [facility];  Service: Orthopedics;  Laterality: Left;   TEE WITHOUT CARDIOVERSION N/A 11/24/2022   Procedure: TRANSESOPHAGEAL ECHOCARDIOGRAM (TEE);  Surgeon: Jake Bathe, MD;  Location: [facility] ENDOSCOPY;  Service: Cardiovascular;  Laterality: N/A;    Family History  Problem Relation Age of Onset   Heart attack Mother     Social History   Socioeconomic History   Marital status: Single    Spouse name: Not on file   Number of children: Not on file   Years of education: Not on file   Highest education level: Not on file  Occupational History   Occupation: waitress  Tobacco Use   Smoking status: Every Day    Current packs/day: 0.25    Average packs/day: 0.3 packs/day for 2.0 years (0.5 ttl pk-yrs)    Types: Cigarettes   Smokeless tobacco: Never  Vaping Use   Vaping status: Every Day  Substance and Sexual Activity   Alcohol use: Not Currently    Comment: occ   Drug use: Yes    Frequency: 1.0 times per week    Types: Marijuana, Cocaine, Amphetamines, Fentanyl    Comment: Fentanyl   Sexual activity: Not Currently    Birth control/protection: None  Other Topics Concern   Not on file  Social History Narrative   Marital status: single   Employment: waitress   Tobacco: daily   Alcohol: socially   Drugs: marijuana  Lives with her friend.   Social Determinants of Health   Financial Resource Strain: Not on file  Food Insecurity: No Food Insecurity (11/18/2022)   Hunger Vital Sign    Worried About Running Out of Food in the Last Year: Never true    Ran Out of Food in the Last Year: Never true  Transportation Needs: No Transportation Needs (11/18/2022)   PRAPARE - Administrator, Civil Service (Medical): No    Lack of Transportation (Non-Medical): No  Physical Activity: Not on file  Stress: Not on file  Social Connections: Not on file  Intimate Partner Violence: Not At Risk (11/18/2022)   Humiliation, Afraid, Rape, and Kick questionnaire    Fear of  Current or Ex-Partner: No    Emotionally Abused: No    Physically Abused: No    Sexually Abused: No    ROS Review of Systems  Constitutional:  Negative for appetite change, chills, fatigue and fever.  HENT:  Negative for congestion, postnasal drip, rhinorrhea and sneezing.   Respiratory:  Negative for cough, shortness of breath and wheezing.   Cardiovascular:  Negative for chest pain, palpitations and leg swelling.  Gastrointestinal:  Negative for abdominal pain, constipation, nausea and vomiting.  Genitourinary:  Negative for difficulty urinating, dysuria, flank pain and frequency.  Musculoskeletal:  Negative for arthralgias, back pain, joint swelling and myalgias.  Skin:  Positive for wound. Negative for color change, pallor and rash.  Neurological:  Negative for dizziness, facial asymmetry, weakness, numbness and headaches.  Psychiatric/Behavioral:  Negative for behavioral problems, confusion, self-injury and suicidal ideas.     Objective:   Today's Vitals: BP 117/77   Pulse 97   Temp (!) 97.2 F (36.2 C)   Ht 5\' 2"  (1.575 m)   Wt 148 lb 9.6 oz (67.4 kg)   SpO2 98%   BMI 27.18 kg/m   Physical Exam Vitals and nursing note reviewed.  Constitutional:      General: She is not in acute distress.    Appearance: Normal appearance. She is not ill-appearing, toxic-appearing or diaphoretic.  HENT:     Mouth/Throat:     Mouth: Mucous membranes are moist.     Pharynx: Oropharynx is clear. No oropharyngeal exudate or posterior oropharyngeal erythema.  Eyes:     General: No scleral icterus.       Right eye: No discharge.        Left eye: No discharge.     Extraocular Movements: Extraocular movements intact.     Conjunctiva/sclera: Conjunctivae normal.  Cardiovascular:     Rate and Rhythm: Normal rate and regular rhythm.     Pulses: Normal pulses.     Heart sounds: Normal heart sounds. No murmur heard.    No friction rub. No gallop.  Pulmonary:     Effort: Pulmonary effort is  normal. No respiratory distress.     Breath sounds: Normal breath sounds. No stridor. No wheezing, rhonchi or rales.  Chest:     Chest wall: No tenderness.  Abdominal:     General: There is no distension.     Palpations: Abdomen is soft.     Tenderness: There is no abdominal tenderness. There is no right CVA tenderness, left CVA tenderness or guarding.  Musculoskeletal:        General: Swelling and tenderness present. No deformity or signs of injury.     Left lower leg: No edema.  Skin:    General: Skin is warm and dry.  Capillary Refill: Capillary refill takes less than 2 seconds.     Coloration: Skin is not jaundiced or pale.     Findings: Erythema present. No bruising or lesion.     Comments: Multiple scars noted on the skin Wound with black colored scab  noted on left lower extremity around the ankle, the affected site is swollen and red.  No drainage noted    Neurological:     Mental Status: She is alert and oriented to person, place, and time.     Motor: No weakness.     Coordination: Coordination normal.     Gait: Gait normal.  Psychiatric:        Mood and Affect: Mood normal.        Behavior: Behavior normal.        Thought Content: Thought content normal.        Judgment: Judgment normal.     Assessment & Plan:   Problem List Items Addressed This Visit       Other   Polysubstance abuse (HCC)    Need to avoid use of illicit drugs discussed Patient verbalized understanding Today She refused information for drug rehab      Tobacco abuse - Primary    Smokes about  less than 0.5 pack/day  Asked about quitting: confirms that he/she currently smokes cigarettes Advise to quit smoking: Educated about QUITTING to reduce the risk of cancer, cardio and cerebrovascular disease. Assess willingness: Unwilling to quit at this time, but is working on cutting back. Assist with counseling and pharmacotherapy: Counseled for 5 minutes and literature provided. Arrange for  follow up: follow up in 1 month and continue to offer help.       Cellulitis of left foot     - AMB referral to wound care center - Ambulatory referral to Infectious Disease - doxycycline (VIBRAMYCIN) 100 MG capsule; Take 1 capsule (100 mg total) by mouth 2 (two) times daily. One po bid x 7 days  Dispense: 14 capsule; Refill: 0       Relevant Medications   doxycycline (VIBRAMYCIN) 100 MG capsule   Other Relevant Orders   AMB referral to wound care center   Ambulatory referral to Infectious Disease   CBC (Completed)   CMP14+EGFR (Completed)   Marijuana abuse    Cessation encouraged      Vaginal odor    UA negative for UTI Vaginal culture is pending      Relevant Orders   NuSwab Vaginitis Plus (VG+) (Completed)   POCT URINALYSIS DIP (CLINITEK) (Completed)   Other Visit Diagnoses     Screening for STD (sexually transmitted disease)       Relevant Orders   HIV Antibody (routine testing w rflx) (Completed)   Hepatitis C antibody (Completed)       Outpatient Encounter Medications as of 08/05/2023  Medication Sig   acetaminophen (TYLENOL) 325 MG tablet Take 325 mg by mouth every 6 (six) hours as needed for fever.   ibuprofen (ADVIL) 200 MG tablet Take 200 mg by mouth every 6 (six) hours as needed for moderate pain.   doxycycline (VIBRAMYCIN) 100 MG capsule Take 1 capsule (100 mg total) by mouth 2 (two) times daily. One po bid x 7 days   naloxone (NARCAN) 1 MG/ML injection Inject 0.4 mLs (0.4 mg total) into the vein as needed. (Patient not taking: Reported on 03/23/2020)   [DISCONTINUED] benzonatate (TESSALON) 100 MG capsule Take 1 capsule (100 mg total) by mouth every 8 (eight) hours. (  Patient not taking: Reported on 03/23/2020)   [DISCONTINUED] doxycycline (VIBRAMYCIN) 100 MG capsule Take 1 capsule (100 mg total) by mouth 2 (two) times daily. One po bid x 7 days (Patient not taking: Reported on 03/23/2020)   [DISCONTINUED] doxycycline (VIBRAMYCIN) 100 MG capsule Take 1 capsule  (100 mg total) by mouth 2 (two) times daily. One po bid x 7 days   [DISCONTINUED] ibuprofen (ADVIL,MOTRIN) 800 MG tablet Take 1 tablet (800 mg total) by mouth 3 (three) times daily. (Patient not taking: Reported on 06/15/2015)   [DISCONTINUED] metroNIDAZOLE (FLAGYL) 500 MG tablet Take 1 tablet (500 mg total) by mouth 2 (two) times daily. One po bid x 7 days (Patient not taking: Reported on 03/23/2020)   No facility-administered encounter medications on file as of 08/05/2023.    Follow-up: Return in about 4 weeks (around 09/02/2023) for CPE.   Donell Beers, FNP

## 2023-08-05 NOTE — Assessment & Plan Note (Signed)
Cessation encouraged 

## 2023-08-06 LAB — CMP14+EGFR
ALT: 53 [IU]/L — ABNORMAL HIGH (ref 0–32)
AST: 25 [IU]/L (ref 0–40)
Albumin: 4.2 g/dL (ref 3.9–4.9)
Alkaline Phosphatase: 132 [IU]/L — ABNORMAL HIGH (ref 44–121)
BUN/Creatinine Ratio: 18 (ref 9–23)
BUN: 14 mg/dL (ref 6–20)
Bilirubin Total: 0.2 mg/dL (ref 0.0–1.2)
CO2: 18 mmol/L — ABNORMAL LOW (ref 20–29)
Calcium: 9.4 mg/dL (ref 8.7–10.2)
Chloride: 103 mmol/L (ref 96–106)
Creatinine, Ser: 0.8 mg/dL (ref 0.57–1.00)
Globulin, Total: 3.4 g/dL (ref 1.5–4.5)
Glucose: 114 mg/dL — ABNORMAL HIGH (ref 70–99)
Potassium: 4.6 mmol/L (ref 3.5–5.2)
Sodium: 141 mmol/L (ref 134–144)
Total Protein: 7.6 g/dL (ref 6.0–8.5)
eGFR: 100 mL/min/{1.73_m2} (ref >59)

## 2023-08-06 LAB — POCT URINALYSIS DIP (CLINITEK)
Bilirubin, UA: NEGATIVE
Blood, UA: NEGATIVE — AB
Glucose, UA: NEGATIVE mg/dL
Ketones, POC UA: NEGATIVE mg/dL
Leukocytes, UA: NEGATIVE
Nitrite, UA: NEGATIVE
POC PROTEIN,UA: NEGATIVE
Spec Grav, UA: >=1.03 — AB (ref 1.010–1.025)
Urobilinogen, UA: 0.2 U/dL
pH, UA: 6.5 (ref 5.0–8.0)

## 2023-08-06 LAB — CBC
Hematocrit: 39.2 % (ref 34.0–46.6)
Hemoglobin: 12.4 g/dL (ref 11.1–15.9)
MCH: 27.1 pg (ref 26.6–33.0)
MCHC: 31.6 g/dL (ref 31.5–35.7)
MCV: 86 fL (ref 79–97)
Platelets: 345 10*3/uL (ref 150–450)
RBC: 4.57 x10E6/uL (ref 3.77–5.28)
RDW: 13.4 % (ref 11.7–15.4)
WBC: 6.1 10*3/uL (ref 3.4–10.8)

## 2023-08-06 LAB — HIV ANTIBODY (ROUTINE TESTING W REFLEX): HIV Screen 4th Generation wRfx: NONREACTIVE

## 2023-08-06 LAB — HEPATITIS C ANTIBODY: Hep C Virus Ab: REACTIVE — AB

## 2023-08-08 ENCOUNTER — Other Ambulatory Visit: Payer: Self-pay | Admitting: Nurse Practitioner

## 2023-08-08 DIAGNOSIS — R768 Other specified abnormal immunological findings in serum: Secondary | ICD-10-CM

## 2023-08-09 ENCOUNTER — Other Ambulatory Visit: Payer: Self-pay

## 2023-08-09 ENCOUNTER — Other Ambulatory Visit: Payer: Self-pay | Admitting: Nurse Practitioner

## 2023-08-09 DIAGNOSIS — L03116 Cellulitis of left lower limb: Secondary | ICD-10-CM

## 2023-08-09 DIAGNOSIS — B9689 Other specified bacterial agents as the cause of diseases classified elsewhere: Secondary | ICD-10-CM

## 2023-08-09 DIAGNOSIS — A599 Trichomoniasis, unspecified: Secondary | ICD-10-CM

## 2023-08-09 LAB — NUSWAB VAGINITIS PLUS (VG+)
Chlamydia trachomatis, NAA: NEGATIVE
Neisseria gonorrhoeae, NAA: NEGATIVE
Trich vag by NAA: POSITIVE — AB

## 2023-08-09 MED ORDER — METRONIDAZOLE 500 MG PO TABS: 500.0000 mg | ORAL_TABLET | Freq: Two times a day (BID) | ORAL | 0 refills | Status: AC

## 2023-08-09 MED ORDER — DOXYCYCLINE HYCLATE 100 MG PO CAPS: 100.0000 mg | ORAL_CAPSULE | Freq: Two times a day (BID) | ORAL | 0 refills | Status: DC

## 2023-08-09 NOTE — Telephone Encounter (Signed)
Please advise Kh 

## 2023-09-02 ENCOUNTER — Ambulatory Visit: Payer: Self-pay | Admitting: Nurse Practitioner

## 2024-04-18 ENCOUNTER — Telehealth: Payer: Self-pay

## 2024-04-18 NOTE — Telephone Encounter (Signed)
 Copied from CRM [id number]. Topic: Clinical - Medication Question >> Apr 17, 2024  4:27 PM Sophia H wrote: Reason for CRM: patient is stating that FNP Paseda has prescribed her metroNIDAZOLE  (FLAGYL ) 500 MG tablet in the past. She is wanting to know if she can have another round sent in to the pharmacy, states it is for the same issue. Please advise. Best contact number [phone]   [id number] - [place], [place] - [address] AT [facility] OF [address]

## 2024-05-03 ENCOUNTER — Encounter: Payer: Self-pay | Admitting: Internal Medicine

## 2024-05-03 ENCOUNTER — Other Ambulatory Visit: Payer: Self-pay

## 2024-05-03 ENCOUNTER — Other Ambulatory Visit (HOSPITAL_COMMUNITY)
Admission: RE | Admit: 2024-05-03 | Discharge: 2024-05-03 | Disposition: A | Discharge status: Home / Self Care | Source: Ambulatory Visit | Attending: Internal Medicine | Admitting: Internal Medicine

## 2024-05-03 ENCOUNTER — Telehealth: Payer: Self-pay

## 2024-05-03 ENCOUNTER — Ambulatory Visit (INDEPENDENT_AMBULATORY_CARE_PROVIDER_SITE_OTHER): Admitting: Internal Medicine

## 2024-05-03 VITALS — BP 128/86 | HR 66 | Resp 16 | Ht 62.0 in | Wt 158.0 lb

## 2024-05-03 DIAGNOSIS — Z1159 Encounter for screening for other viral diseases: Secondary | ICD-10-CM

## 2024-05-03 DIAGNOSIS — Z113 Encounter for screening for infections with a predominantly sexual mode of transmission: Secondary | ICD-10-CM

## 2024-05-03 DIAGNOSIS — L0390 Cellulitis, unspecified: Secondary | ICD-10-CM

## 2024-05-03 LAB — CYTOLOGY, (ORAL, ANAL, URETHRAL) ANCILLARY ONLY
Chlamydia: NEGATIVE
Comment: NEGATIVE
Comment: NEGATIVE
Neisseria Gonorrhea: NEGATIVE

## 2024-05-03 MED ORDER — CEFADROXIL 500 MG PO CAPS: 1000.0000 mg | ORAL_CAPSULE | Freq: Two times a day (BID) | ORAL | 0 refills | Status: AC

## 2024-05-03 MED ORDER — DOXYCYCLINE HYCLATE 100 MG PO TABS: 100.0000 mg | ORAL_TABLET | Freq: Two times a day (BID) | ORAL | 0 refills | Status: AC

## 2024-05-03 NOTE — Patient Instructions (Addendum)
 For your cellulitis Take  1) doxycycline  1 tab twice a day for 28 days -- this would cover syphilis and chlamydia if one have it (testing being done) 2) cefadroxil 2 tabs twice a day for 14 days   See us  in 2-3 weeks to discuss further hiv/std prevention   Please consider getting yourself in rehab again        [facility]  Call for dates   [address], [place], [place] [address] Phone: [phone]  Mental Health Resources  988: can call or text 24/7  [facility]: Address: [address], [place], [place] [address] Open 24 hours Phone: [phone]  [facility] [place]: Address: [address] [place], [place] [address] Phone: [phone] Appointments: [email or web address]    Smoking Cessation: QuitlineNC 1-800-QUIT-NOW (919) 695-3679); Espaol: 1-855-Djelo-Ya (1-(865)445-8674) [email or web address]

## 2024-05-03 NOTE — Progress Notes (Signed)
 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 female here for cellulitis, referred by pcp.   There is an id clinic referral in 07/2023 but never made it  She decide to come today  She skin pops and shoots in the veins in the legs where she can  She has bilateral feet trace swelling and several sores  No fever, chill  No joint/back pain  2 weeks also with burning/itching in urine  Has been with boyfriend for about a year; he uses crack She uses cocaine, fentanyl , amphetamine   She has been in drug rehab before 4 years ago, willing to go back      Review of Systems: ROS All other ros negative        Past Medical History:  Diagnosis Date   Attention deficit disorder (ADD)    Hepatitis C    MRSA (methicillin resistant staph aureus) culture positive    Substance abuse (HCC)     Social History   Tobacco Use   Smoking status: Every Day    Current packs/day: 0.25    Average packs/day: 0.3 packs/day for 2.0 years (0.5 ttl pk-yrs)     Types: Cigarettes    Passive exposure: Never   Smokeless tobacco: Never  Vaping Use   Vaping status: Every Day  Substance Use Topics   Alcohol use: Not Currently    Comment: occ   Drug use: Yes    Frequency: 1.0 times per week    Types: Marijuana, Cocaine, Amphetamines, Fentanyl     Comment: Fentanyl     Family History  Problem Relation Age of Onset   Heart attack Mother     Allergies  Allergen Reactions   Dextromethorphan     Shaking    OBJECTIVE: Vitals:   05/03/24 1448  BP: 128/86  Pulse: 66  Resp: 16  Weight: 158 lb (71.7 kg)  Height: 5' 2 (1.575 m)   Body mass index is 28.9 kg/m.   Physical Exam General/constitutional: no distress, pleasant HEENT: Normocephalic, PER, Conj Clear, EOMI, Oropharynx clear Neck supple CV: rrr no mrg Lungs: clear to auscultation, normal respiratory effort Abd: Soft, Nontender Ext: no edema Skin: bilateral legs multiple healed skin popping scar and a few open ulcer without purulence (left lateral distal thigh); left leg a mildly tender area without fluctuance           Neuro: nonfocal MSK: no peripheral joint swelling/tenderness/warmth; back spines nontender    Lab: Lab Results  Component Value Date   WBC 6.1 08/05/2023   HGB 12.4 08/05/2023  HCT 39.2 08/05/2023   MCV 86 08/05/2023   PLT 345 08/05/2023   Last metabolic panel Lab Results  Component Value Date   GLUCOSE 114 (H) 08/05/2023   NA 141 08/05/2023   K 4.6 08/05/2023   CL 103 08/05/2023   CO2 18 (L) 08/05/2023   BUN 14 08/05/2023   CREATININE 0.80 08/05/2023   EGFR 100 08/05/2023   CALCIUM 9.4 08/05/2023   PROT 7.6 08/05/2023   ALBUMIN 4.2 08/05/2023   LABGLOB 3.4 08/05/2023   BILITOT 0.2 08/05/2023   ALKPHOS 132 (H) 08/05/2023   AST 25 08/05/2023   ALT 53 (H) 08/05/2023   ANIONGAP 10 01/10/2023    Microbiology:  Serology:  Imaging:   Assessment/plan: Problem List Items Addressed This Visit   None Visit Diagnoses        Screening for STDs (sexually transmitted diseases)    -  Primary   Relevant Orders   RPR   HIV antibody (with reflex)   Urine cytology ancillary only   Cytology (oral, anal, urethral) ancillary only   Cytology (oral, anal, urethral) ancillary only     Cellulitis, unspecified cellulitis site       Relevant Orders   CBC   COMPLETE METABOLIC PANEL WITHOUT GFR     Need for hepatitis B screening test       Relevant Orders   Hepatitis B surface antibody,quantitative   Hepatitis B Surface AntiGEN   Hepatitis B Core Antibody, total     Need for hepatitis C screening test       Relevant Orders   Hepatitis C antibody   HCV-RNA, Quant Real-Time PCR w/reflex         #std screening Patient monogamous. She said she got trich end of 2024 likely from current boyfriend. 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  Constance ONEIDA Passer, MD [facility] 05/03/2024, 3:06 PM

## 2024-05-03 NOTE — Telephone Encounter (Signed)
 Reset Mychart password

## 2024-05-04 LAB — URINE CYTOLOGY ANCILLARY ONLY
Chlamydia: NEGATIVE
Comment: NEGATIVE
Comment: NEGATIVE
Comment: NORMAL
Neisseria Gonorrhea: NEGATIVE
Trichomonas: NEGATIVE

## 2024-05-04 LAB — CYTOLOGY, (ORAL, ANAL, URETHRAL) ANCILLARY ONLY
Chlamydia: NEGATIVE
Comment: NORMAL
Comment: NORMAL
Neisseria Gonorrhea: NEGATIVE

## 2024-05-14 LAB — COMPLETE METABOLIC PANEL WITHOUT GFR
AG Ratio: 1.2 (calc) (ref 1.0–2.5)
ALT: 42 U/L — ABNORMAL HIGH (ref 6–29)
AST: 17 U/L (ref 10–30)
Albumin: 4.1 g/dL (ref 3.6–5.1)
Alkaline phosphatase (APISO): 115 U/L (ref 31–125)
BUN: 11 mg/dL (ref 7–25)
CO2: 28 mmol/L (ref 20–32)
Calcium: 9.2 mg/dL (ref 8.6–10.2)
Chloride: 101 mmol/L (ref 98–110)
Creat: 0.6 mg/dL (ref 0.50–0.97)
Globulin: 3.4 g/dL (ref 1.9–3.7)
Glucose, Bld: 97 mg/dL (ref 65–99)
Potassium: 4.5 mmol/L (ref 3.5–5.3)
Sodium: 134 mmol/L — ABNORMAL LOW (ref 135–146)
Total Bilirubin: 0.3 mg/dL (ref 0.2–1.2)
Total Protein: 7.5 g/dL (ref 6.1–8.1)

## 2024-05-14 LAB — CBC
HCT: 37 % (ref 35.0–45.0)
Hemoglobin: 11.8 g/dL (ref 11.7–15.5)
MCH: 26.5 pg — ABNORMAL LOW (ref 27.0–33.0)
MCHC: 31.9 g/dL — ABNORMAL LOW (ref 32.0–36.0)
MCV: 83.1 fL (ref 80.0–100.0)
MPV: 9.9 fL (ref 7.5–12.5)
Platelets: 365 Thousand/uL (ref 140–400)
RBC: 4.45 Million/uL (ref 3.80–5.10)
RDW: 13.2 % (ref 11.0–15.0)
WBC: 7.9 Thousand/uL (ref 3.8–10.8)

## 2024-05-14 LAB — HEPATITIS B SURFACE ANTIBODY, QUANTITATIVE: Hep B S AB Quant (Post): <5 m[IU]/mL — ABNORMAL LOW (ref >=10)

## 2024-05-14 LAB — HCV RNA NS5A DRUG RESISTANCE

## 2024-05-14 LAB — HEPATITIS B CORE ANTIBODY, TOTAL: Hep B Core Total Ab: REACTIVE — AB

## 2024-05-14 LAB — HEPATITIS C ANTIBODY: Hepatitis C Ab: REACTIVE — AB

## 2024-05-14 LAB — HCV RNA,QUANTITATIVE REAL TIME PCR
HCV Quantitative Log: 5.67 {Log_IU}/mL — ABNORMAL HIGH
HCV RNA, PCR, QN: 469000 [IU]/mL — ABNORMAL HIGH

## 2024-05-14 LAB — HIV ANTIBODY (ROUTINE TESTING W REFLEX): HIV 1&2 Ab, 4th Generation: NONREACTIVE

## 2024-05-14 LAB — HEPATITIS B SURFACE ANTIGEN: Hepatitis B Surface Ag: NONREACTIVE

## 2024-05-14 LAB — HCV RNA,LIPA RFLX NS5A DRUG RESIST

## 2024-05-14 LAB — RPR: RPR Ser Ql: NONREACTIVE

## 2024-05-14 LAB — HCV RNA, QUANT REAL-TIME PCR W/REFLEX
HCV RNA, PCR, QN (Log): 6.1 {Log_IU}/mL — ABNORMAL HIGH
HCV RNA, PCR, QN: 1250000 [IU]/mL — ABNORMAL HIGH

## 2024-05-17 ENCOUNTER — Other Ambulatory Visit: Payer: Self-pay | Admitting: Internal Medicine

## 2024-05-17 ENCOUNTER — Ambulatory Visit: Admitting: Internal Medicine

## 2024-05-17 ENCOUNTER — Telehealth: Payer: Self-pay

## 2024-05-17 ENCOUNTER — Other Ambulatory Visit (HOSPITAL_COMMUNITY): Payer: Self-pay

## 2024-05-17 DIAGNOSIS — B182 Chronic viral hepatitis C: Secondary | ICD-10-CM

## 2024-05-17 NOTE — Telephone Encounter (Signed)
 Called patient to notify her of positive hep c results and schedule lab appointment, no answer. Left HIPAA compliant voicemail requesting callback.   Elastrography to be scheduled by referral coordinator and patient will need virtual visit with provider once labs and US  have been completed.   Shaila Gilchrest, BSN, RN

## 2024-05-17 NOTE — Telephone Encounter (Signed)
 Pharmacy Patient Advocate Encounter  Insurance verification completed.   The patient is insured through [employer]   Ran test claim for [employer]. Currently a quantity of 84 is a 28 day supply and the co-pay is $4.00 . Epclusa $4.00 Prescription can be filled at [facility].  This test claim was processed through [facility]- copay amounts may vary at other pharmacies due to pharmacy/plan contracts, or as the patient moves through the different stages of their insurance plan.

## 2024-05-17 NOTE — Telephone Encounter (Signed)
-----   Message from Constance ONEIDA Passer sent at 05/17/2024  1:47 PM EDT ----- Hi Meg, Could you let her know about hep c and we can treat her. I have ordered elastography, but will need some more blood test for genotype   I'll order both blood test and elastography    Once she done those she can do a video visit to discuss management  thanks

## 2024-05-21 NOTE — Telephone Encounter (Signed)
 Attempted to reach patient to discuss results, no answer. Left HIPAA compliant voicemail requesting callback.   Jolon Degante, BSN, RN

## 2024-05-23 ENCOUNTER — Ambulatory Visit: Payer: Self-pay | Admitting: Internal Medicine

## 2024-05-29 ENCOUNTER — Other Ambulatory Visit (HOSPITAL_COMMUNITY): Payer: Self-pay

## 2024-07-20 ENCOUNTER — Inpatient Hospital Stay (HOSPITAL_COMMUNITY)
Admission: EM | Admit: 2024-07-20 | Discharge: 2024-07-22 | DRG: 832 | Disposition: A | Discharge status: Home / Self Care | Attending: Internal Medicine | Admitting: Internal Medicine

## 2024-07-20 ENCOUNTER — Inpatient Hospital Stay (HOSPITAL_COMMUNITY)

## 2024-07-20 ENCOUNTER — Encounter (HOSPITAL_COMMUNITY): Payer: Self-pay

## 2024-07-20 ENCOUNTER — Emergency Department (HOSPITAL_COMMUNITY)

## 2024-07-20 DIAGNOSIS — Z8249 Family history of ischemic heart disease and other diseases of the circulatory system: Secondary | ICD-10-CM

## 2024-07-20 DIAGNOSIS — L97819 Non-pressure chronic ulcer of other part of right lower leg with unspecified severity: Secondary | ICD-10-CM | POA: Diagnosis present

## 2024-07-20 DIAGNOSIS — O98811 Other maternal infectious and parasitic diseases complicating pregnancy, first trimester: Principal | ICD-10-CM | POA: Diagnosis present

## 2024-07-20 DIAGNOSIS — O99331 Smoking (tobacco) complicating pregnancy, first trimester: Secondary | ICD-10-CM | POA: Diagnosis present

## 2024-07-20 DIAGNOSIS — O1201 Gestational edema, first trimester: Secondary | ICD-10-CM | POA: Diagnosis present

## 2024-07-20 DIAGNOSIS — F17210 Nicotine dependence, cigarettes, uncomplicated: Secondary | ICD-10-CM | POA: Diagnosis present

## 2024-07-20 DIAGNOSIS — O99711 Diseases of the skin and subcutaneous tissue complicating pregnancy, first trimester: Secondary | ICD-10-CM | POA: Diagnosis present

## 2024-07-20 DIAGNOSIS — T148XXA Other injury of unspecified body region, initial encounter: Secondary | ICD-10-CM

## 2024-07-20 DIAGNOSIS — Z3A11 11 weeks gestation of pregnancy: Secondary | ICD-10-CM

## 2024-07-20 DIAGNOSIS — L97829 Non-pressure chronic ulcer of other part of left lower leg with unspecified severity: Secondary | ICD-10-CM | POA: Diagnosis present

## 2024-07-20 DIAGNOSIS — O99321 Drug use complicating pregnancy, first trimester: Secondary | ICD-10-CM | POA: Diagnosis present

## 2024-07-20 DIAGNOSIS — I96 Gangrene, not elsewhere classified: Secondary | ICD-10-CM | POA: Diagnosis present

## 2024-07-20 DIAGNOSIS — O98411 Viral hepatitis complicating pregnancy, first trimester: Secondary | ICD-10-CM | POA: Diagnosis present

## 2024-07-20 DIAGNOSIS — F1110 Opioid abuse, uncomplicated: Secondary | ICD-10-CM | POA: Diagnosis present

## 2024-07-20 DIAGNOSIS — Z8614 Personal history of Methicillin resistant Staphylococcus aureus infection: Secondary | ICD-10-CM | POA: Diagnosis not present

## 2024-07-20 DIAGNOSIS — Z888 Allergy status to other drugs, medicaments and biological substances status: Secondary | ICD-10-CM

## 2024-07-20 DIAGNOSIS — L03116 Cellulitis of left lower limb: Secondary | ICD-10-CM | POA: Diagnosis present

## 2024-07-20 DIAGNOSIS — L089 Local infection of the skin and subcutaneous tissue, unspecified: Principal | ICD-10-CM

## 2024-07-20 DIAGNOSIS — L03115 Cellulitis of right lower limb: Secondary | ICD-10-CM | POA: Diagnosis present

## 2024-07-20 DIAGNOSIS — B182 Chronic viral hepatitis C: Secondary | ICD-10-CM | POA: Diagnosis present

## 2024-07-20 DIAGNOSIS — F1990 Other psychoactive substance use, unspecified, uncomplicated: Secondary | ICD-10-CM

## 2024-07-20 DIAGNOSIS — L03119 Cellulitis of unspecified part of limb: Secondary | ICD-10-CM

## 2024-07-20 DIAGNOSIS — Z3490 Encounter for supervision of normal pregnancy, unspecified, unspecified trimester: Secondary | ICD-10-CM

## 2024-07-20 LAB — URINALYSIS, W/ REFLEX TO CULTURE (INFECTION SUSPECTED)
Bacteria, UA: NONE SEEN
Bilirubin Urine: NEGATIVE
Glucose, UA: NEGATIVE mg/dL
Hgb urine dipstick: NEGATIVE
Ketones, ur: NEGATIVE mg/dL
Leukocytes,Ua: NEGATIVE
Nitrite: NEGATIVE
Protein, ur: NEGATIVE mg/dL
Specific Gravity, Urine: 1.004 — ABNORMAL LOW (ref 1.005–1.030)
pH: 7 (ref 5.0–8.0)

## 2024-07-20 LAB — CBC WITH DIFFERENTIAL/PLATELET
Abs Immature Granulocytes: 0.03 K/uL (ref 0.00–0.07)
Basophils Absolute: 0 K/uL (ref 0.0–0.1)
Basophils Relative: 0 %
Eosinophils Absolute: 0.2 K/uL (ref 0.0–0.5)
Eosinophils Relative: 2 %
HCT: 29.7 % — ABNORMAL LOW (ref 36.0–46.0)
Hemoglobin: 9.3 g/dL — ABNORMAL LOW (ref 12.0–15.0)
Immature Granulocytes: 0 %
Lymphocytes Relative: 18 %
Lymphs Abs: 1.2 K/uL (ref 0.7–4.0)
MCH: 25.4 pg — ABNORMAL LOW (ref 26.0–34.0)
MCHC: 31.3 g/dL (ref 30.0–36.0)
MCV: 81.1 fL (ref 80.0–100.0)
Monocytes Absolute: 0.6 K/uL (ref 0.1–1.0)
Monocytes Relative: 9 %
Neutro Abs: 4.8 K/uL (ref 1.7–7.7)
Neutrophils Relative %: 71 %
Platelets: 255 K/uL (ref 150–400)
RBC: 3.66 MIL/uL — ABNORMAL LOW (ref 3.87–5.11)
RDW: 14.3 % (ref 11.5–15.5)
WBC: 6.7 K/uL (ref 4.0–10.5)
nRBC: 0 % (ref 0.0–0.2)

## 2024-07-20 LAB — COMPREHENSIVE METABOLIC PANEL WITH GFR
ALT: 25 U/L (ref 0–44)
AST: 18 U/L (ref 15–41)
Albumin: 3.4 g/dL — ABNORMAL LOW (ref 3.5–5.0)
Alkaline Phosphatase: 127 U/L — ABNORMAL HIGH (ref 38–126)
Anion gap: 11 (ref 5–15)
BUN: 7 mg/dL (ref 6–20)
CO2: 20 mmol/L — ABNORMAL LOW (ref 22–32)
Calcium: 8.7 mg/dL — ABNORMAL LOW (ref 8.9–10.3)
Chloride: 104 mmol/L (ref 98–111)
Creatinine, Ser: 0.4 mg/dL — ABNORMAL LOW (ref 0.44–1.00)
GFR, Estimated: >60 mL/min (ref >60)
Glucose, Bld: 88 mg/dL (ref 70–99)
Potassium: 3.4 mmol/L — ABNORMAL LOW (ref 3.5–5.1)
Sodium: 135 mmol/L (ref 135–145)
Total Bilirubin: 0.4 mg/dL (ref 0.0–1.2)
Total Protein: 6.7 g/dL (ref 6.5–8.1)

## 2024-07-20 LAB — I-STAT CG4 LACTIC ACID, ED: Lactic Acid, Venous: 0.4 mmol/L — ABNORMAL LOW (ref 0.5–1.9)

## 2024-07-20 LAB — HCG, QUANTITATIVE, PREGNANCY: hCG, Beta Chain, Quant, S: 120433 m[IU]/mL — ABNORMAL HIGH (ref <5)

## 2024-07-20 MED ORDER — ALBUTEROL SULFATE (2.5 MG/3ML) 0.083% IN NEBU: 2.5000 mg | INHALATION_SOLUTION | RESPIRATORY_TRACT | Status: DC | PRN

## 2024-07-20 MED ORDER — TRAZODONE HCL 50 MG PO TABS: 25.0000 mg | ORAL_TABLET | Freq: Every evening | ORAL | Status: DC | PRN

## 2024-07-20 MED ORDER — ENOXAPARIN SODIUM 40 MG/0.4ML IJ SOSY
40.0000 mg | PREFILLED_SYRINGE | INTRAMUSCULAR | Status: DC
  Administered 2024-07-20 – 2024-07-22 (×3): 40 mg via SUBCUTANEOUS
  Filled 2024-07-20 (×3): qty 0.4

## 2024-07-20 MED ORDER — ONDANSETRON HCL 4 MG/2ML IJ SOLN: 4.0000 mg | Freq: Four times a day (QID) | INTRAMUSCULAR | Status: DC | PRN

## 2024-07-20 MED ORDER — ONDANSETRON HCL 4 MG PO TABS: 4.0000 mg | ORAL_TABLET | Freq: Four times a day (QID) | ORAL | Status: DC | PRN

## 2024-07-20 MED ORDER — PRENATAL MULTIVITAMIN CH
1.0000 | ORAL_TABLET | Freq: Every day | ORAL | Status: DC
  Administered 2024-07-20 – 2024-07-21 (×2): 1 via ORAL
  Filled 2024-07-20 (×3): qty 1

## 2024-07-20 MED ORDER — ACETAMINOPHEN 650 MG RE SUPP: 650.0000 mg | Freq: Four times a day (QID) | RECTAL | Status: DC | PRN

## 2024-07-20 MED ORDER — VANCOMYCIN HCL IN DEXTROSE 1-5 GM/200ML-% IV SOLN
1000.0000 mg | Freq: Two times a day (BID) | INTRAVENOUS | Status: DC
  Administered 2024-07-20 – 2024-07-22 (×4): 1000 mg via INTRAVENOUS
  Filled 2024-07-20 (×5): qty 200

## 2024-07-20 MED ORDER — ACETAMINOPHEN 325 MG PO TABS: 650.0000 mg | ORAL_TABLET | Freq: Four times a day (QID) | ORAL | Status: DC | PRN

## 2024-07-20 MED ORDER — VANCOMYCIN HCL IN DEXTROSE 1-5 GM/200ML-% IV SOLN
1000.0000 mg | Freq: Once | INTRAVENOUS | Status: AC
  Administered 2024-07-20: 1000 mg via INTRAVENOUS
  Filled 2024-07-20: qty 200

## 2024-07-20 MED ORDER — MEDIHONEY WOUND/BURN DRESSING EX PSTE
1.0000 | PASTE | Freq: Every day | CUTANEOUS | Status: DC
  Administered 2024-07-21 – 2024-07-22 (×2): 1 via TOPICAL
  Filled 2024-07-20 (×3): qty 44

## 2024-07-20 NOTE — H&P (Signed)
 History and Physical  Cassandra Roberts:[id number] DOB: 19-Apr-1991 DOA: 07/20/2024  PCP: Paseda, Folashade R, FNP   Chief Complaint: Leg swelling and draining wounds  HPI: Cassandra Roberts is a 33 y.o. female with medical history significant for ADD, hepatitis C, multifocal pneumonia and MRSA bacteremia January 2024 with ongoing IV heroin abuse admitted with lower extremity cellulitis.  Patient states that for the last several days, she has had increased swelling and discomfort in her bilateral lower extremities from her lower legs down into her feet.  She has a couple of wounds in the bilateral lower extremities which have been draining some pus, she has been trying to keep it clean but they are worsening.  There is also associated erythema, she feels like she maybe had a fever yesterday.  Denies any nausea or vomiting.  On workup in the emergency department, she is nontoxic and stable, found to have elevated beta-hCG and OB ultrasound shows 11-week viable intrauterine pregnancy.  Review of Systems: Please see HPI for pertinent positives and negatives. A complete 10 system review of systems are otherwise negative.  Past Medical History:  Diagnosis Date   Attention deficit disorder (ADD)    Hepatitis C    MRSA (methicillin resistant staph aureus) culture positive    Substance abuse (HCC)    Past Surgical History:  Procedure Laterality Date   APPENDECTOMY     MINOR IRRIGATION AND DEBRIDEMENT OF WOUND Left 03/23/2020   Procedure: MINOR IRRIGATION AND DEBRIDEMENT OF WOUND;  Surgeon: Vernetta Lonni GRADE, MD;  Location: [facility]S;  Service: Orthopedics;  Laterality: Left;   TEE WITHOUT CARDIOVERSION N/A 11/24/2022   Procedure: TRANSESOPHAGEAL ECHOCARDIOGRAM (TEE);  Surgeon: Jeffrie Oneil BROCKS, MD;  Location: [facility] ENDOSCOPY;  Service: Cardiovascular;  Laterality: N/A;   Social History:  reports that she has been smoking cigarettes. She has a 0.5 pack-year smoking history. She has never been exposed to  tobacco smoke. She has never used smokeless tobacco. She reports that she does not currently use alcohol. She reports current drug use. Frequency: 1.00 time per week. Drugs: Marijuana, Cocaine, Amphetamines, and Fentanyl .  Allergies  Allergen Reactions   Dextromethorphan     Shaking    Family History  Problem Relation Age of Onset   Heart attack Mother      Prior to Admission medications   Medication Sig Start Date End Date Taking? Authorizing Provider  acetaminophen  (TYLENOL ) 325 MG tablet Take 325 mg by mouth every 6 (six) hours as needed for fever.    [provider]  ibuprofen  (ADVIL ) 200 MG tablet Take 200 mg by mouth every 6 (six) hours as needed for moderate pain.    [provider]  naloxone  (NARCAN ) 1 MG/ML injection Inject 0.4 mLs (0.4 mg total) into the vein as needed. Patient not taking: Reported on 05/03/2024 06/15/15   Nettie, April, MD    Physical Exam: BP 131/72 (BP Location: Left Arm)   Pulse 89   Temp 98.4 F (36.9 C) (Oral)   Resp 17   Ht 5' 2 (1.575 m)   LMP 04/18/2024 (Approximate) Comment: patient was shielded  SpO2 98%   BMI 28.90 kg/m  General:  Alert, oriented, calm, in no acute distress, pleasant and cooperative, ambulating in her room Cardiovascular: RRR, no murmurs or rubs, no peripheral edema  Respiratory: clear to auscultation bilaterally, no wheezes, no crackles  Abdomen: soft, nontender, nondistended, normal bowel tones heard  Skin: Scattered numerous areas of excoriation, nondraining erythema in the bilateral lower extremities, with 3  small necrotic areas, see photograph below Musculoskeletal: no joint effusions, normal range of motion  Psychiatric: appropriate affect, normal speech  Neurologic: extraocular muscles intact, clear speech, moving all extremities with intact sensorium              Labs on Admission:  Basic Metabolic Panel: Recent Labs  Lab 07/20/24 0342  NA 135  K 3.4*  CL 104  CO2 20*  GLUCOSE 88   BUN 7  CREATININE 0.40*  CALCIUM 8.7*   Liver Function Tests: Recent Labs  Lab 07/20/24 0342  AST 18  ALT 25  ALKPHOS 127*  BILITOT 0.4  PROT 6.7  ALBUMIN 3.4*   No results for input(s): LIPASE, AMYLASE in the last 168 hours. No results for input(s): AMMONIA in the last 168 hours. CBC: Recent Labs  Lab 07/20/24 0342  WBC 6.7  NEUTROABS 4.8  HGB 9.3*  HCT 29.7*  MCV 81.1  PLT 255   Cardiac Enzymes: No results for input(s): CKTOTAL, CKMB, CKMBINDEX, TROPONINI in the last 168 hours. BNP (last 3 results) No results for input(s): BNP in the last 8760 hours.  ProBNP (last 3 results) No results for input(s): PROBNP in the last 8760 hours.  CBG: No results for input(s): GLUCAP in the last 168 hours.  Radiological Exams on Admission: US  OB Comp Less 14 Wks Result Date: 07/20/2024 CLINICAL DATA:  33 year old female with unknown dates. Quantitative beta HCG 120,433. EXAM: OBSTETRIC <14 WK ULTRASOUND TECHNIQUE: Transabdominal ultrasound was performed for evaluation of the gestation as well as the maternal uterus and adnexal regions. COMPARISON:  None Available. FINDINGS: Intrauterine gestational sac: Single Yolk sac:  Not visible Embryo:  Visible Cardiac Activity: Detected Heart Rate: 171 bpm CRL:   4.3 mm   11 w 1 d                  US  EDC: 02/07/2025 Subchorionic hemorrhage:  None visualized. Maternal uterus/adnexae: Both ovaries obscured by bowel gas. No pelvis free fluid identified. IMPRESSION: Single living IUP with estimated gestational age of [redacted] weeks and 1 day by CRL. Electronically Signed   By: VEAR Hurst M.D.   On: 07/20/2024 07:57   DG Tibia/Fibula Left Result Date: 07/20/2024 EXAM: 3 VIEW(S) XRAY OF THE LEFT TIBIA AND FIBULA 07/20/2024 05:35:00 AM COMPARISON: None available. CLINICAL HISTORY: Wound infection, leg. Cellulitis, open wound anterior surface mid shaft, entire leg red and swollen. FINDINGS: BONES AND JOINTS: No acute fracture. No focal osseous  lesion. No joint dislocation. SOFT TISSUES: Soft tissue defect in anterior soft tissues of mid lower extremity. Diffuse soft tissue swelling of lower extremity. IMPRESSION: 1. Soft tissue defect in anterior soft tissues of mid lower extremity. 2. Diffuse soft tissue swelling of lower extremity. Compatible with cellulitis. Electronically signed by: Waddell Calk MD 07/20/2024 06:21 AM EDT RP Workstation: [id number]   DG Chest 2 View Result Date: 07/20/2024 EXAM: 2 VIEW(S) XRAY OF THE CHEST 07/20/2024 03:50:00 AM COMPARISON: 11/18/2022 CLINICAL HISTORY: 01364 Infection 01364. Multiple infected wounds on hands and legs, IV drug user. FINDINGS: LUNGS AND PLEURA: No focal pulmonary opacity. No pulmonary edema. No pleural effusion. No pneumothorax. HEART AND MEDIASTINUM: No acute abnormality of the cardiac and mediastinal silhouettes. BONES AND SOFT TISSUES: No acute osseous abnormality. IMPRESSION: 1. No acute process. Electronically signed by: Norman Gatlin MD 07/20/2024 03:58 AM EDT RP Workstation: [id number]   Assessment/Plan Kendrea Dona is a 33 y.o. female with medical history significant for ADD, hepatitis C, multifocal pneumonia and MRSA bacteremia January 2024  with ongoing IV heroin abuse admitted with lower extremity cellulitis.   Lower extremity cellulitis-without evidence of sepsis, due to injecting heroin in the lower extremities, presents with bilateral lower extremity swelling, erythema and purulent drainage -Inpatient admission -Empiric IV vancomycin  -Follow-up peripheral blood cultures  IUP-with approximate gestational age of [redacted] weeks -Avoid teratogens -Prenatal vitamin -Outpatient OB follow-up  DVT prophylaxis: Lovenox      Code Status: Full Code  Consults called: None  Admission status: The appropriate patient status for this patient is INPATIENT. Inpatient status is judged to be reasonable and necessary in order to provide the required intensity of service to ensure the  patient's safety. The patient's presenting symptoms, physical exam findings, and initial radiographic and laboratory data in the context of their chronic comorbidities is felt to place them at high risk for further clinical deterioration. Furthermore, it is not anticipated that the patient will be medically stable for discharge from the hospital within 2 midnights of admission.    I certify that at the point of admission it is my clinical judgment that the patient will require inpatient hospital care spanning beyond 2 midnights from the point of admission due to high intensity of service, high risk for further deterioration and high frequency of surveillance required  Time spent: 48 minutes  Yarel Rushlow CHRISTELLA Gail MD [facility] Pager [phone]  If 7PM-7AM, please contact night-coverage [email or web address] Password [email or web address]  07/20/2024, 8:14 AM

## 2024-07-20 NOTE — Consult Note (Addendum)
 WOC Nurse Consult Note: Reason for Consult: Requested to assess Lower extremity wounds due drug injections (see Dr Zella notes) Wound type: Full thickness. Pressure Injury POA: NA Measurement: Multiple wound bilateral legs (see photos and flowsheet). One on Right hip, draining serous drainage. 0.2 x 0.2 cm x 0.3 cm. Wound bed: 100% non viable tissue. Partial dark dry eschar. Drainage (amount, consistency, odor) Scant amount, serous, odor present. Periwound: Intact, edema, erythema. Dressing procedure/placement/frequency: Cleanse with Vashe [id number], not rinse. Pat dry the peri-wound skin. Apply Medihoney to the wound beds daily. Cover with Foam dressing, change every 3 days or PRN. Wrap the legs with Kerlix and Coban to decrease the edema, since the base of toes, until the knees. Starts to compress the coban band on the ankle covering 50% of each layer with the next band. Change daily.  Left hip: Express the area to drain the effluent, Apply Medihoney to the wound bed and cover with foam dressing, change everyday or PRN.  WOC team will not plan to follow further. Please reconsult if further assistance is needed. Thank-you,  Lela Holm RN, CNS, [employer], MSN.  (Phone [phone])

## 2024-07-20 NOTE — Plan of Care (Signed)

## 2024-07-20 NOTE — Progress Notes (Signed)
 P/harmacy Antibiotic Note  Cassandra Roberts is a 33 y.o. pregnant female admitted on 07/20/2024 with cellulitis, lower extremity wounds. Patient has a history of IVDU. HCG test positive (gestational age [redacted] weeks per US ). Pharmacy has been consulted for vancomycin  dosing.  Patient given vancomycin  1000mg  IV X1 while in the ED this morning.   Plan: - Start vancomycin  1000mg  IV q12h (eAUC 446 using Scr rounded to 0.8, IBW, Vd 0.9). Will start vancomycin  infusion slightly earlier than 12hrs from loading dose given true loading dose of 1500mg  not given this morning.  - Vanc levels PRN - Monitor renal function, cultures, and overall clinical picture  - De-escalate abx as able   Height: 5' 2 (157.5 cm) Weight: 70.4 kg (155 lb 1.6 oz) IBW/kg (Calculated) : 50.1  Temp (24hrs), Avg:98.3 F (36.8 C), Min:98.1 F (36.7 C), Max:98.4 F (36.9 C)  Recent Labs  Lab 07/20/24 0342 07/20/24 0437  WBC 6.7  --   CREATININE 0.40*  --   LATICACIDVEN  --  0.4*    Estimated Creatinine Clearance: 92.8 mL/min (A) (by C-G formula based on SCr of 0.4 mg/dL (L)).    Allergies  Allergen Reactions   Dextromethorphan     Shaking    Antimicrobials this admission: 9/12 vancomycin  >>   Dose adjustments this admission: N/A  Microbiology results: 9/12 BCx: ip   Thank you for allowing pharmacy to be a part of this patient's care.  Lacinda Moats, PharmD Clinical Pharmacist  9/12/202510:07 AM

## 2024-07-20 NOTE — ED Triage Notes (Signed)
 Patient arrived with bilateral leg wounds, and extremity swelling/redness. States she injected drugs causing the wounds. Also reports having a positive pregnancy test.

## 2024-07-20 NOTE — ED Provider Notes (Addendum)
 [place] EMERGENCY DEPARTMENT AT [facility] Provider Note   CSN: [id number] Arrival date & time: 07/20/24  9682     Patient presents with: Wound Infection   Cassandra Roberts is a 33 y.o. female.   HPI     This is a 33 year old female who presents with lower extremity wounds.  Patient has a history of IV drug use primarily injecting heroin.  Patient reports that over the last several weeks she has developed wounds over the lower extremities and injection sites.  She has tried to keep them clean but they have gotten bigger and they are not healing.  She thinks she may have had a fever yesterday but is unsure.  She has noted wounds to be draining.  Prior to Admission medications   Medication Sig Start Date End Date Taking? Authorizing Provider  acetaminophen  (TYLENOL ) 325 MG tablet Take 325 mg by mouth every 6 (six) hours as needed for fever.    [provider]  ibuprofen  (ADVIL ) 200 MG tablet Take 200 mg by mouth every 6 (six) hours as needed for moderate pain.    [provider]  naloxone  (NARCAN ) 1 MG/ML injection Inject 0.4 mLs (0.4 mg total) into the vein as needed. Patient not taking: Reported on 05/03/2024 06/15/15   Palumbo, April, MD    Allergies: Dextromethorphan    Review of Systems  Constitutional:  Positive for fever.  Respiratory:  Negative for shortness of breath.   Cardiovascular:  Negative for chest pain.  Skin:  Positive for wound.  All other systems reviewed and are negative.   Updated Vital Signs BP (!) 156/99   Pulse (!) 106   Temp 98.1 F (36.7 C)   Resp (!) 21   LMP 04/18/2024 (Approximate) Comment: patient was shielded  SpO2 96%   Physical Exam Vitals and nursing note reviewed.  Constitutional:      Appearance: She is well-developed. She is ill-appearing. She is not toxic-appearing.  HENT:     Head: Normocephalic and atraumatic.  Eyes:     Pupils: Pupils are equal, round, and reactive to light.  Cardiovascular:      Rate and Rhythm: Regular rhythm. Tachycardia present.     Heart sounds: Normal heart sounds.  Pulmonary:     Effort: Pulmonary effort is normal. No respiratory distress.     Breath sounds: No wheezing.  Abdominal:     Palpations: Abdomen is soft.  Musculoskeletal:        General: Swelling present.     Cervical back: Neck supple.     Comments: 2+ pedal edema bilaterally extending into the bilateral lower legs  Skin:    General: Skin is warm and dry.     Comments: Erythema noted over the bilateral feet, multiple subcutaneous wounds over the bilateral lower extremities with the largest over the left lower extremity, there is eschar and dead tissue noted with underlying drainage, bilateral feet erythematous Track marks bilateral upper extremities with erythema and swelling of the bilateral hands  Neurological:     Mental Status: She is alert and oriented to person, place, and time.  Psychiatric:        Mood and Affect: Mood normal.        (all labs ordered are listed, but only abnormal results are displayed) Labs Reviewed  COMPREHENSIVE METABOLIC PANEL WITH GFR - Abnormal; Notable for the following components:      Result Value   Potassium 3.4 (*)    CO2 20 (*)  Creatinine, Ser 0.40 (*)    Calcium 8.7 (*)    Albumin 3.4 (*)    Alkaline Phosphatase 127 (*)    All other components within normal limits  CBC WITH DIFFERENTIAL/PLATELET - Abnormal; Notable for the following components:   RBC 3.66 (*)    Hemoglobin 9.3 (*)    HCT 29.7 (*)    MCH 25.4 (*)    All other components within normal limits  HCG, QUANTITATIVE, PREGNANCY - Abnormal; Notable for the following components:   hCG, Beta Chain, Quant, S 120,433 (*)    All other components within normal limits  I-STAT CG4 LACTIC ACID, ED - Abnormal; Notable for the following components:   Lactic Acid, Venous 0.4 (*)    All other components within normal limits  CULTURE, BLOOD (ROUTINE X 2)  CULTURE, BLOOD (ROUTINE X 2)   URINALYSIS, W/ REFLEX TO CULTURE (INFECTION SUSPECTED)  I-STAT CG4 LACTIC ACID, ED    EKG: None  Radiology: DG Tibia/Fibula Left Result Date: 07/20/2024 EXAM: 3 VIEW(S) XRAY OF THE LEFT TIBIA AND FIBULA 07/20/2024 05:35:00 AM COMPARISON: None available. CLINICAL HISTORY: Wound infection, leg. Cellulitis, open wound anterior surface mid shaft, entire leg red and swollen. FINDINGS: BONES AND JOINTS: No acute fracture. No focal osseous lesion. No joint dislocation. SOFT TISSUES: Soft tissue defect in anterior soft tissues of mid lower extremity. Diffuse soft tissue swelling of lower extremity. IMPRESSION: 1. Soft tissue defect in anterior soft tissues of mid lower extremity. 2. Diffuse soft tissue swelling of lower extremity. Compatible with cellulitis. Electronically signed by: Waddell Calk MD 07/20/2024 06:21 AM EDT RP Workstation: [id number]   DG Chest 2 View Result Date: 07/20/2024 EXAM: 2 VIEW(S) XRAY OF THE CHEST 07/20/2024 03:50:00 AM COMPARISON: 11/18/2022 CLINICAL HISTORY: 01364 Infection 01364. Multiple infected wounds on hands and legs, IV drug user. FINDINGS: LUNGS AND PLEURA: No focal pulmonary opacity. No pulmonary edema. No pleural effusion. No pneumothorax. HEART AND MEDIASTINUM: No acute abnormality of the cardiac and mediastinal silhouettes. BONES AND SOFT TISSUES: No acute osseous abnormality. IMPRESSION: 1. No acute process. Electronically signed by: Norman Gatlin MD 07/20/2024 03:58 AM EDT RP Workstation: [id number]     .Critical Care  Performed by: Bari Charmaine FALCON, MD Authorized by: Bari Charmaine FALCON, MD   Critical care provider statement:    Critical care time (minutes):  35   Critical care was necessary to treat or prevent imminent or life-threatening deterioration of the following conditions: skin infection requiring IV medication.   Critical care was time spent personally by me on the following activities:  Development of treatment plan with patient or surrogate,  discussions with consultants, evaluation of patient's response to treatment, examination of patient, ordering and review of laboratory studies, ordering and review of radiographic studies, ordering and performing treatments and interventions, pulse oximetry, re-evaluation of patient's condition and review of old charts    Medications Ordered in the ED  vancomycin  (VANCOCIN ) IVPB 1000 mg/200 mL premix (1,000 mg Intravenous New Bag/Given 07/20/24 0520)    Clinical Course as of 07/20/24 0629  Fri Jul 20, 2024  0628 Pregnancy did come back positive.  Beta-hCG greater than 120,000.  Will order dating ultrasound. [CH]  [phone] Hospitalist updated. [CH]    Clinical Course User Index [CH] Owyn Raulston, Charmaine FALCON, MD                                 Medical Decision Making Amount and/or Complexity of  Data Reviewed Labs: ordered. Radiology: ordered.  Risk Prescription drug management. Decision regarding hospitalization.   This patient presents to the ED for concern of lower extremity wounds, this involves an extensive number of treatment options, and is a complaint that carries with it a high risk of complications and morbidity.  I considered the following differential and admission for this acute, potentially life threatening condition.  The differential diagnosis includes cellulitis, osteomyelitis, wound infection  MDM:    This is a 33 year old female who presents with bilateral lower extremity wounds related to injecting IV drugs.  She is nontoxic.  Mildly tachycardic.  Reports fever yesterday.  No fever today.  Labs obtained.  Lactate normal.  No significant metabolic derangements.  White count is 6.7.  Blood cultures obtained.  Patient with significant wounds of the bilateral lower extremities with eschar and lower extremity edema with erythema suggestive of cellulitis.  Given IV drug use history, high risk for MRSA and bacteremia.  Patient given a dose of IV vancomycin .  Will admit to the  hospitalist.  (Labs, imaging, consults)  Labs: I Ordered, and personally interpreted labs.  The pertinent results include: CBC, CMP, blood cultures  Imaging Studies ordered: I ordered imaging studies including chest x-ray, lower extremity x-rays I independently visualized and interpreted imaging. I agree with the radiologist interpretation  Additional history obtained from chart review.  External records from outside source obtained and reviewed including prior eval  Cardiac Monitoring: The patient was maintained on a cardiac monitor.  If on the cardiac monitor, I personally viewed and interpreted the cardiac monitored which showed an underlying rhythm of: Sinus  Reevaluation: After the interventions noted above, I reevaluated the patient and found that they have :stayed the same  Social Determinants of Health:  lives independently  Disposition: Admit  Co morbidities that complicate the patient evaluation  Past Medical History:  Diagnosis Date   Attention deficit disorder (ADD)    Hepatitis C    MRSA (methicillin resistant staph aureus) culture positive    Substance abuse (HCC)      Medicines Meds ordered this encounter  Medications   vancomycin  (VANCOCIN ) IVPB 1000 mg/200 mL premix    Indication::   Wound Infection    I have reviewed the patients home medicines and have made adjustments as needed  Problem List / ED Course: Problem List Items Addressed This Visit       Other   IVDU (intravenous drug user)   Other Visit Diagnoses       Wound infection    -  Primary   Relevant Medications   vancomycin  (VANCOCIN ) IVPB 1000 mg/200 mL premix (Completed)     Cellulitis of lower extremity, unspecified laterality         Pregnancy, unspecified gestational age                    Final diagnoses:  Wound infection  Cellulitis of lower extremity, unspecified laterality  IVDU (intravenous drug user)  Pregnancy, unspecified gestational age    ED Discharge  Orders     None          Jettie Lazare, Charmaine FALCON, MD 07/20/24 9464    Bari Charmaine FALCON, MD 07/20/24 [phone]

## 2024-07-21 DIAGNOSIS — L089 Local infection of the skin and subcutaneous tissue, unspecified: Secondary | ICD-10-CM | POA: Diagnosis not present

## 2024-07-21 DIAGNOSIS — T148XXA Other injury of unspecified body region, initial encounter: Secondary | ICD-10-CM | POA: Diagnosis not present

## 2024-07-21 LAB — CBC
HCT: 34.2 % — ABNORMAL LOW (ref 36.0–46.0)
Hemoglobin: 10.5 g/dL — ABNORMAL LOW (ref 12.0–15.0)
MCH: 24.9 pg — ABNORMAL LOW (ref 26.0–34.0)
MCHC: 30.7 g/dL (ref 30.0–36.0)
MCV: 81 fL (ref 80.0–100.0)
Platelets: 259 K/uL (ref 150–400)
RBC: 4.22 MIL/uL (ref 3.87–5.11)
RDW: 14.5 % (ref 11.5–15.5)
WBC: 9.1 K/uL (ref 4.0–10.5)
nRBC: 0 % (ref 0.0–0.2)

## 2024-07-21 LAB — BASIC METABOLIC PANEL WITH GFR
Anion gap: 13 (ref 5–15)
BUN: 8 mg/dL (ref 6–20)
CO2: 19 mmol/L — ABNORMAL LOW (ref 22–32)
Calcium: 8.7 mg/dL — ABNORMAL LOW (ref 8.9–10.3)
Chloride: 101 mmol/L (ref 98–111)
Creatinine, Ser: 0.58 mg/dL (ref 0.44–1.00)
GFR, Estimated: >60 mL/min (ref >60)
Glucose, Bld: 101 mg/dL — ABNORMAL HIGH (ref 70–99)
Potassium: 3.9 mmol/L (ref 3.5–5.1)
Sodium: 133 mmol/L — ABNORMAL LOW (ref 135–145)

## 2024-07-21 LAB — MRSA NEXT GEN BY PCR, NASAL: MRSA by PCR Next Gen: DETECTED — AB

## 2024-07-21 MED ORDER — SODIUM CHLORIDE 0.9 % IV SOLN
2.0000 g | INTRAVENOUS | Status: DC
  Administered 2024-07-21 – 2024-07-22 (×2): 2 g via INTRAVENOUS
  Filled 2024-07-21 (×2): qty 20

## 2024-07-21 NOTE — Consult Note (Signed)
 WOC nurse consulted for simple wound care instructions for patient to follow at home.  Wound care instructions changed to  Cleanse bilateral leg wounds with Vashe wound cleanser, do not rinse and allow to air dry. Apply Medihoney to wound beds daily, fill in wounds with dry gauze and secure with dry gauze and clothe tape or silicone foams whichever available to patient.   POC discussed with bedside nurse and Dr. Raenelle.  Would send patient home with at least one full bottle of Vashe and tube of Medihoney, gauze and clothe tape for home use (the majority of which should already be in the room).    Thank you,    Powell Bar MSN, RN-BC, [employer]

## 2024-07-21 NOTE — Plan of Care (Signed)

## 2024-07-21 NOTE — Progress Notes (Signed)
 PROGRESS NOTE    Cassandra Roberts  FMW:[id number] DOB: August 27, 1991 DOA: 07/20/2024 PCP: Paseda, Folashade R, FNP    Brief Narrative:  33 year old with ADD, chronic hep C, history of MRSA with multifocal pneumonia, ongoing heroin injections comes with about 1 month of bilateral lower extremity cellulitis, worsening wound on the left leg.  Also found to be [redacted] weeks pregnant.  Admitted due to significant symptoms.  Subjective: Patient seen and examined.  Denies any complaints.  Denies any withdrawal symptoms.  Mild pain persist in the leg.  Afebrile. Assessment & Plan:   Bilateral lower extremity cellulitis and wounds secondary to subcutaneous drug injections: Significant infection.  No evidence of sepsis.  No evidence of osteomyelitis. Blood cultures, pending Local cultures, pending Currently on vancomycin .  Will add Rocephin  today. Wound care consultation to make home care plans. Anticipate home with oral antibiotics and wound care if no positive blood cultures by next 24 hours.  Intravaginal pregnancy: Found to have [redacted] weeks pregnant.  Outpatient OB/GYN follow-up.  IV drug use: Currently uncomplicated.  Counseled to quit.     DVT prophylaxis: enoxaparin  (LOVENOX ) injection 40 mg Start: 07/20/24 1000   Code Status: Full code Family Communication: None at the bedside Disposition Plan: Status is: Inpatient Remains inpatient appropriate because: Significant infection on IV antibiotics     Consultants:  None  Procedures:  None  Antimicrobials:  Vancomycin  Rocephin  9/12---     Objective: Vitals:   07/20/24 1648 07/20/24 2112 07/21/24 0233 07/21/24 0547  BP: (!) 143/89 129/74 109/62 114/67  Pulse: 87 81 73 76  Resp: 16 16 16 16   Temp: 98.8 F (37.1 C) 98.4 F (36.9 C) 98.4 F (36.9 C) (!) 97.5 F (36.4 C)  TempSrc:  Oral Oral Oral  SpO2: 100% 100% 100% 100%  Weight:      Height:        Intake/Output Summary (Last 24 hours) at 07/21/2024 1334 Last data filed at  07/21/2024 1000 Gross per 24 hour  Intake 1470 ml  Output 0 ml  Net 1470 ml   Filed Weights   07/20/24 0900  Weight: 70.4 kg    Examination:  General exam: Appears calm and comfortable.  Pleasant interactive. Respiratory system: Clear to auscultation. Respiratory effort normal. Cardiovascular system: S1 & S2 heard, RRR. No JVD, murmurs, rubs, gallops or clicks. No pedal edema. Gastrointestinal system: Abdomen is nondistended, soft and nontender. No organomegaly or masses felt. Normal bowel sounds heard. Central nervous system: Alert and oriented. No focal neurological deficits. Extremities: Symmetric 5 x 5 power. Skin:  Multiple skin ulcerations, multiple needle marks all over the extremities. Draining ulcer left shin, picture in the chart.  Slight purulence present. Unstageable ulcer with eschar right shin.  Picture in the chart.    Data Reviewed: I have personally reviewed following labs and imaging studies  CBC: Recent Labs  Lab 07/20/24 0342 07/21/24 0539  WBC 6.7 9.1  NEUTROABS 4.8  --   HGB 9.3* 10.5*  HCT 29.7* 34.2*  MCV 81.1 81.0  PLT 255 259   Basic Metabolic Panel: Recent Labs  Lab 07/20/24 0342 07/21/24 0539  NA 135 133*  K 3.4* 3.9  CL 104 101  CO2 20* 19*  GLUCOSE 88 101*  BUN 7 8  CREATININE 0.40* 0.58  CALCIUM 8.7* 8.7*   GFR: Estimated Creatinine Clearance: 92.8 mL/min (by C-G formula based on SCr of 0.58 mg/dL). Liver Function Tests: Recent Labs  Lab 07/20/24 0342  AST 18  ALT 25  ALKPHOS  127*  BILITOT 0.4  PROT 6.7  ALBUMIN 3.4*   No results for input(s): LIPASE, AMYLASE in the last 168 hours. No results for input(s): AMMONIA in the last 168 hours. Coagulation Profile: No results for input(s): INR, PROTIME in the last 168 hours. Cardiac Enzymes: No results for input(s): CKTOTAL, CKMB, CKMBINDEX, TROPONINI in the last 168 hours. BNP (last 3 results) No results for input(s): PROBNP in the last 8760  hours. HbA1C: No results for input(s): HGBA1C in the last 72 hours. CBG: No results for input(s): GLUCAP in the last 168 hours. Lipid Profile: No results for input(s): CHOL, HDL, LDLCALC, TRIG, CHOLHDL, LDLDIRECT in the last 72 hours. Thyroid Function Tests: No results for input(s): TSH, T4TOTAL, FREET4, T3FREE, THYROIDAB in the last 72 hours. Anemia Panel: No results for input(s): VITAMINB12, FOLATE, FERRITIN, TIBC, IRON, RETICCTPCT in the last 72 hours. Sepsis Labs: Recent Labs  Lab 07/20/24 0437  LATICACIDVEN 0.4*    Recent Results (from the past 240 hours)  Blood culture (routine x 2)     Status: None (Preliminary result)   Collection Time: 07/20/24  5:05 AM   Specimen: BLOOD  Result Value Ref Range Status   Specimen Description   Final    BLOOD RIGHT ANTECUBITAL Performed at [facility], [address] [place], [place] [address]    Special Requests   Final    BOTTLES DRAWN AEROBIC AND ANAEROBIC Blood Culture adequate volume Performed at [facility], [address] [place], [place] [address]    Culture   Final    NO GROWTH < 24 HOURS Performed at [facility], [address] [place], [place] [address]    Report Status PENDING  Incomplete  Blood culture (routine x 2)     Status: None (Preliminary result)   Collection Time: 07/20/24  5:10 AM   Specimen: BLOOD  Result Value Ref Range Status   Specimen Description   Final    BLOOD LEFT ANTECUBITAL Performed at [facility], [address] [place], [place] [address]    Special Requests   Final    BOTTLES DRAWN AEROBIC AND ANAEROBIC Blood Culture adequate volume Performed at [facility], [address] [place], [place] [address]    Culture   Final    NO GROWTH < 24 HOURS Performed at [facility], [address] [place], [place] [address]    Report Status PENDING  Incomplete          Radiology Studies: US  OB Comp Less 14 Wks Result Date: 07/20/2024 CLINICAL DATA:  34 year old female with unknown dates. Quantitative beta HCG 120,433. EXAM: OBSTETRIC <14 WK ULTRASOUND TECHNIQUE: Transabdominal ultrasound was performed for evaluation of the gestation as well as the maternal uterus and adnexal regions. COMPARISON:  None Available. FINDINGS: Intrauterine gestational sac: Single Yolk sac:  Not visible Embryo:  Visible Cardiac Activity: Detected Heart Rate: 171 bpm CRL:   4.3 mm   11 w 1 d                  US  EDC: 02/07/2025 Subchorionic hemorrhage:  None visualized. Maternal uterus/adnexae: Both ovaries obscured by bowel gas. No pelvis free fluid identified. IMPRESSION: Single living IUP with estimated gestational age of [redacted] weeks and 1 day by CRL. Electronically Signed   By: VEAR Hurst M.D.   On: 07/20/2024 07:57   DG Tibia/Fibula Left Result Date: 07/20/2024 EXAM: 3 VIEW(S) XRAY OF THE LEFT TIBIA AND  FIBULA 07/20/2024 05:35:00 AM COMPARISON: None available. CLINICAL HISTORY: Wound infection, leg. Cellulitis, open wound anterior surface mid shaft, entire leg red and swollen. FINDINGS: BONES AND JOINTS: No acute fracture. No focal osseous lesion. No joint dislocation. SOFT TISSUES: Soft tissue defect in anterior soft tissues of mid lower extremity. Diffuse soft tissue swelling of lower extremity. IMPRESSION: 1. Soft tissue defect in anterior soft tissues of mid lower extremity. 2. Diffuse soft tissue swelling of lower extremity. Compatible with cellulitis. Electronically signed by: Waddell Calk MD 07/20/2024 06:21 AM EDT RP Workstation: [id number]   DG Chest 2 View Result Date: 07/20/2024 EXAM: 2 VIEW(S) XRAY OF THE CHEST 07/20/2024 03:50:00 AM COMPARISON: 11/18/2022 CLINICAL HISTORY: 01364 Infection 01364. Multiple infected wounds on hands and legs, IV drug user. FINDINGS: LUNGS AND PLEURA: No focal pulmonary opacity. No pulmonary edema. No pleural effusion. No pneumothorax. HEART  AND MEDIASTINUM: No acute abnormality of the cardiac and mediastinal silhouettes. BONES AND SOFT TISSUES: No acute osseous abnormality. IMPRESSION: 1. No acute process. Electronically signed by: Norman Gatlin MD 07/20/2024 03:58 AM EDT RP Workstation: [id number]        Scheduled Meds:  enoxaparin  (LOVENOX ) injection  40 mg Subcutaneous Q24H   leptospermum manuka honey  1 Application Topical Daily   prenatal multivitamin  1 tablet Oral Q1200   Continuous Infusions:  cefTRIAXone  (ROCEPHIN )  IV 2 g (07/21/24 1043)   vancomycin  1,000 mg (07/21/24 0221)     LOS: 1 day    Time spent: 51 minutes    Renato Applebaum, MD [facility]

## 2024-07-21 NOTE — Plan of Care (Signed)

## 2024-07-22 DIAGNOSIS — T148XXA Other injury of unspecified body region, initial encounter: Secondary | ICD-10-CM | POA: Diagnosis not present

## 2024-07-22 DIAGNOSIS — L089 Local infection of the skin and subcutaneous tissue, unspecified: Secondary | ICD-10-CM | POA: Diagnosis not present

## 2024-07-22 MED ORDER — CLINDAMYCIN HCL 300 MG PO CAPS: 300.0000 mg | ORAL_CAPSULE | Freq: Three times a day (TID) | ORAL | 0 refills | Status: AC

## 2024-07-22 NOTE — Progress Notes (Addendum)
 Previous nurse, Damien Scrape, RN shared that she had changed the pt's dressing earlier in the shift (on first shift). Pt confirmed.

## 2024-07-22 NOTE — Discharge Summary (Signed)
 Physician Discharge Summary  Cassandra Roberts FMW:[id number] DOB: [birth year] DOA: 07/20/2024  PCP: Paseda, Folashade R, FNP  Admit date: 07/20/2024 Discharge date: 07/22/2024  Admitted From: Home Disposition: Home  Recommendations for Outpatient Follow-up:  Follow up with PCP in 1-2 weeks   Discharge Condition: Stable CODE STATUS: Full code Diet recommendation: Regular diet  Discharge summary: 33 year old with ADD, chronic hep C, history of MRSA with multifocal pneumonia in the past, ongoing heroin injections comes to the hospital with about 1 month of bilateral lower extremity cellulitis, worsening left leg wound.  She was also found to be 11 months pregnant on screening.  Bilateral lower extremity cellulitis and wounds secondary to injectable drug use. She had presented with significant infection but no evidence of sepsis.  No evidence of systemic infection.  No evidence of osteomyelitis. Blood cultures, drawn and negative so far. Local cultures, not drawn before starting antibiotics. Treated with vancomycin  and Rocephin , day 3 today. She will need aggressive local wound care.  MRSA swab was positive.  Blood cultures negative. Given her first trimester pregnancy, unable to use many of the antibiotics.  Among all the antibiotics, clindamycin  was only based alternative to treat her with possibly MRSA causing localized skin and soft tissue infection. Clindamycin  for additional 11 days to complete 2 weeks of therapy. Aggressive local wound care, dressing instructions at home.   Pregnancy: Found to have [redacted] weeks pregnant.  Outpatient OB/GYN follow-up.   IV drug use: Currently uncomplicated.  Counseled to quit.  Stable to discharge home today.  Discharge Diagnoses:  Principal Problem:   Infected wound    Discharge Instructions  Discharge Instructions     Diet general   Complete by: As directed    Discharge wound care:   Complete by: As directed    Cleanse wounds to bilateral  lower legs and hip with Vashe wound cleanser, do not rinse and allow to air dry.  Apply a small amount of Medihoney to wound beds daily, fill in wound beds with dry gauze/cover with dry gauze and secure with clothe tape.   Increase activity slowly   Complete by: As directed       Allergies as of 07/22/2024       Reactions   Dextromethorphan Other (See Comments)   Shaking        Medication List     TAKE these medications    clindamycin  300 MG capsule Commonly known as: Cleocin  Take 1 capsule (300 mg total) by mouth 3 (three) times daily for 11 days.   naloxone  1 MG/ML injection Commonly known as: NARCAN  Inject 0.4 mLs (0.4 mg total) into the vein as needed. What changed: reasons to take this               Discharge Care Instructions  (From admission, onward)           Start     Ordered   07/22/24 0000  Discharge wound care:       Comments: Cleanse wounds to bilateral lower legs and hip with Vashe wound cleanser, do not rinse and allow to air dry.  Apply a small amount of Medihoney to wound beds daily, fill in wound beds with dry gauze/cover with dry gauze and secure with clothe tape.   07/22/24 1053            Allergies  Allergen Reactions   Dextromethorphan Other (See Comments)    Shaking    Consultations: None   Procedures/Studies: US  OB Comp Less 14  Wks Result Date: 07/20/2024 CLINICAL DATA:  33 year old female with unknown dates. Quantitative beta HCG 120,433. EXAM: OBSTETRIC <14 WK ULTRASOUND TECHNIQUE: Transabdominal ultrasound was performed for evaluation of the gestation as well as the maternal uterus and adnexal regions. COMPARISON:  None Available. FINDINGS: Intrauterine gestational sac: Single Yolk sac:  Not visible Embryo:  Visible Cardiac Activity: Detected Heart Rate: 171 bpm CRL:   4.3 mm   11 w 1 d                  US  EDC: 02/07/2025 Subchorionic hemorrhage:  None visualized. Maternal uterus/adnexae: Both ovaries obscured by bowel gas.  No pelvis free fluid identified. IMPRESSION: Single living IUP with estimated gestational age of [redacted] weeks and 1 day by CRL. Electronically Signed   By: VEAR Hurst M.D.   On: 07/20/2024 07:57   DG Tibia/Fibula Left Result Date: 07/20/2024 EXAM: 3 VIEW(S) XRAY OF THE LEFT TIBIA AND FIBULA 07/20/2024 05:35:00 AM COMPARISON: None available. CLINICAL HISTORY: Wound infection, leg. Cellulitis, open wound anterior surface mid shaft, entire leg red and swollen. FINDINGS: BONES AND JOINTS: No acute fracture. No focal osseous lesion. No joint dislocation. SOFT TISSUES: Soft tissue defect in anterior soft tissues of mid lower extremity. Diffuse soft tissue swelling of lower extremity. IMPRESSION: 1. Soft tissue defect in anterior soft tissues of mid lower extremity. 2. Diffuse soft tissue swelling of lower extremity. Compatible with cellulitis. Electronically signed by: Waddell Calk MD 07/20/2024 06:21 AM EDT RP Workstation: [id number]   DG Chest 2 View Result Date: 07/20/2024 EXAM: 2 VIEW(S) XRAY OF THE CHEST 07/20/2024 03:50:00 AM COMPARISON: 11/18/2022 CLINICAL HISTORY: 01364 Infection 01364. Multiple infected wounds on hands and legs, IV drug user. FINDINGS: LUNGS AND PLEURA: No focal pulmonary opacity. No pulmonary edema. No pleural effusion. No pneumothorax. HEART AND MEDIASTINUM: No acute abnormality of the cardiac and mediastinal silhouettes. BONES AND SOFT TISSUES: No acute osseous abnormality. IMPRESSION: 1. No acute process. Electronically signed by: Norman Gatlin MD 07/20/2024 03:58 AM EDT RP Workstation: [id number]   (Echo, Carotid, EGD, Colonoscopy, ERCP)    Subjective: Patient seen and examined.  Denies any complaints.  She thinks her wounds are better.  New pictures taken and uploaded in the chart.  She can do wound cleaning and dressing changes at home.  Afebrile.   Discharge Exam: Vitals:   07/21/24 2125 07/22/24 0526  BP: 118/67 117/71  Pulse: 83 84  Resp: 18 18  Temp: 98.4 F (36.9 C)  98.7 F (37.1 C)  SpO2: 98% 99%   Vitals:   07/21/24 0547 07/21/24 1344 07/21/24 2125 07/22/24 0526  BP: 114/67 114/73 118/67 117/71  Pulse: 76 79 83 84  Resp: 16 16 18 18   Temp: (!) 97.5 F (36.4 C) 98.3 F (36.8 C) 98.4 F (36.9 C) 98.7 F (37.1 C)  TempSrc: Oral Oral Oral Oral  SpO2: 100% 100% 98% 99%  Weight:      Height:        General: Pt is alert, awake, not in acute distress.  Walking around the hallway. Cardiovascular: RRR, S1/S2 +, no rubs, no gallops Respiratory: CTA bilaterally, no wheezing, no rhonchi Abdominal: Soft, NT, ND, bowel sounds + Extremities:  Needle marks all over the body. Left mid leg anterior shin, open wound.  Picture in the chart.  Minimal drainage but granulation tissue at the bases. Right upper shin with eschar formation.  No underlying abscess.      The results of significant diagnostics from this hospitalization (including imaging, microbiology,  ancillary and laboratory) are listed below for reference.     Microbiology: Recent Results (from the past 240 hours)  Blood culture (routine x 2)     Status: None (Preliminary result)   Collection Time: 07/20/24  5:05 AM   Specimen: BLOOD  Result Value Ref Range Status   Specimen Description   Final    BLOOD RIGHT ANTECUBITAL Performed at [facility], [address] [place], [place] [address]    Special Requests   Final    BOTTLES DRAWN AEROBIC AND ANAEROBIC Blood Culture adequate volume Performed at [facility], [address] [place], [place] [address]    Culture   Final    NO GROWTH 2 DAYS Performed at [facility], [address], [place], [place] [address]    Report Status PENDING  Incomplete  Blood culture (routine x 2)     Status: None (Preliminary result)   Collection Time: 07/20/24  5:10 AM   Specimen: BLOOD  Result Value Ref Range Status   Specimen Description   Final    BLOOD LEFT ANTECUBITAL Performed at [facility], [address] [place], [place] [address]    Special Requests   Final    BOTTLES DRAWN AEROBIC AND ANAEROBIC Blood Culture adequate volume Performed at [facility], [address], [place], [place] [address]    Culture   Final    NO GROWTH 2 DAYS Performed at [facility], [address], [place], [place] [address]    Report Status PENDING  Incomplete  MRSA Next Gen by PCR, Nasal     Status: Abnormal   Collection Time: 07/21/24 12:37 PM   Specimen: Nasal Mucosa; Nasal Swab  Result Value Ref Range Status   MRSA by PCR Next Gen DETECTED (A) NOT DETECTED Final    Comment: CRITICAL RESULT CALLED TO, READ BACK BY AND VERIFIED WITH: BRIZZEE, E RN AT 1724 ON 07/21/2024 BY PRUDY, K (NOTE) The GeneXpert MRSA Assay (FDA approved for NASAL specimens only), is one component of a comprehensive MRSA colonization surveillance program. It is not intended to diagnose MRSA infection nor to guide or monitor treatment for MRSA infections. Test performance is not FDA approved in patients less than 26 years old. Performed at [facility], [address], [place], [place] [address]      Labs: BNP (last 3 results) No results for input(s): BNP in the last 8760 hours. Basic Metabolic Panel: Recent Labs  Lab 07/20/24 0342 07/21/24 0539  NA 135 133*  K 3.4* 3.9  CL 104 101  CO2 20* 19*  GLUCOSE 88 101*  BUN 7 8  CREATININE 0.40* 0.58  CALCIUM 8.7* 8.7*   Liver Function Tests: Recent Labs  Lab 07/20/24 0342  AST 18  ALT 25  ALKPHOS 127*  BILITOT 0.4  PROT 6.7  ALBUMIN 3.4*   No results for input(s): LIPASE, AMYLASE in the last 168 hours. No results for input(s): AMMONIA in the last 168 hours. CBC: Recent Labs  Lab 07/20/24 0342 07/21/24 0539  WBC 6.7 9.1  NEUTROABS 4.8  --   HGB 9.3* 10.5*  HCT 29.7* 34.2*  MCV 81.1 81.0  PLT 255 259   Cardiac Enzymes: No results for input(s): CKTOTAL, CKMB,  CKMBINDEX, TROPONINI in the last 168 hours. BNP: Invalid input(s): POCBNP CBG: No results for input(s): GLUCAP in the last 168 hours. D-Dimer No results for input(s): DDIMER in the last 72 hours.  Hgb A1c No results for input(s): HGBA1C in the last 72 hours. Lipid Profile No results for input(s): CHOL, HDL, LDLCALC, TRIG, CHOLHDL, LDLDIRECT in the last 72 hours. Thyroid function studies No results for input(s): TSH, T4TOTAL, T3FREE, THYROIDAB in the last 72 hours.  Invalid input(s): FREET3 Anemia work up No results for input(s): VITAMINB12, FOLATE, FERRITIN, TIBC, IRON, RETICCTPCT in the last 72 hours. Urinalysis    Component Value Date/Time   COLORURINE STRAW (A) 07/20/2024 0342   APPEARANCEUR CLEAR 07/20/2024 0342   LABSPEC 1.004 (L) 07/20/2024 0342   PHURINE 7.0 07/20/2024 0342   GLUCOSEU NEGATIVE 07/20/2024 0342   HGBUR NEGATIVE 07/20/2024 0342   BILIRUBINUR NEGATIVE 07/20/2024 0342   BILIRUBINUR negative 08/05/2023 1232   KETONESUR NEGATIVE 07/20/2024 0342   PROTEINUR NEGATIVE 07/20/2024 0342   UROBILINOGEN 0.2 08/05/2023 1232   UROBILINOGEN 0.2 07/02/2015 1044   NITRITE NEGATIVE 07/20/2024 0342   LEUKOCYTESUR NEGATIVE 07/20/2024 0342   Sepsis Labs Recent Labs  Lab 07/20/24 0342 07/21/24 0539  WBC 6.7 9.1   Microbiology Recent Results (from the past 240 hours)  Blood culture (routine x 2)     Status: None (Preliminary result)   Collection Time: 07/20/24  5:05 AM   Specimen: BLOOD  Result Value Ref Range Status   Specimen Description   Final    BLOOD RIGHT ANTECUBITAL Performed at [facility], [address] [place], [place] [address]    Special Requests   Final    BOTTLES DRAWN AEROBIC AND ANAEROBIC Blood Culture adequate volume Performed at [facility], [address] [place], [place] [address]    Culture   Final    NO GROWTH 2 DAYS Performed at [facility], [address], [place], [place] [address]    Report Status PENDING  Incomplete  Blood culture (routine x 2)     Status: None (Preliminary result)   Collection Time: 07/20/24  5:10 AM   Specimen: BLOOD  Result Value Ref Range Status   Specimen Description   Final    BLOOD LEFT ANTECUBITAL Performed at [facility], [address], [place], [place] [address]    Special Requests   Final    BOTTLES DRAWN AEROBIC AND ANAEROBIC Blood Culture adequate volume Performed at [facility], [address], [place], [place] [address]    Culture   Final    NO GROWTH 2 DAYS Performed at [facility], [address], [place], [place] [address]    Report Status PENDING  Incomplete  MRSA Next Gen by PCR, Nasal     Status: Abnormal   Collection Time: 07/21/24 12:37 PM   Specimen: Nasal Mucosa; Nasal Swab  Result Value Ref Range Status   MRSA by PCR Next Gen DETECTED (A) NOT DETECTED Final    Comment: CRITICAL RESULT CALLED TO, READ BACK BY AND VERIFIED WITH: BRIZZEE, E RN AT 1724 ON 07/21/2024 BY PRUDY, K (NOTE) The GeneXpert MRSA Assay (FDA approved for NASAL specimens only), is one component of a comprehensive MRSA colonization surveillance program. It is not intended to diagnose MRSA infection nor to guide or monitor treatment for MRSA infections. Test performance is not FDA approved in patients less than 44 years old. Performed at [facility], [address], [place], [place] [address]      Time coordinating discharge: 40 minutes  SIGNED:   Renato Applebaum, MD  [facility] 07/22/2024, 1:10 PM

## 2024-07-22 NOTE — Progress Notes (Signed)
 Discharge instructions given to patient and all questions were answered. Patient provided with wound care instructions and supplies.

## 2024-07-23 ENCOUNTER — Telehealth: Payer: Self-pay

## 2024-07-23 NOTE — Transitions of Care (Post Inpatient/ED Visit) (Unsigned)
   07/23/2024  Name: Cassandra Roberts MRN: [id number] DOB: July 01, 1991  Today's TOC FU Call Status: Today's TOC FU Call Status:: Unsuccessful Call (1st Attempt) Unsuccessful Call (1st Attempt) Date: 07/23/24  Attempted to reach the patient regarding the most recent Inpatient/ED visit.  Follow Up Plan: Additional outreach attempts will be made to reach the patient to complete the Transitions of Care (Post Inpatient/ED visit) call.   Signature Julian Lemmings, LPN [facility] Nurse Health Advisor Direct Dial [phone]

## 2024-07-24 NOTE — Transitions of Care (Post Inpatient/ED Visit) (Unsigned)
   07/24/2024  Name: Cassandra Roberts MRN: [id number] DOB: 10/17/1991  Today's TOC FU Call Status: Today's TOC FU Call Status:: Unsuccessful Call (2nd Attempt) Unsuccessful Call (1st Attempt) Date: 07/23/24 Unsuccessful Call (2nd Attempt) Date: 07/24/24  Attempted to reach the patient regarding the most recent Inpatient/ED visit.  Follow Up Plan: Additional outreach attempts will be made to reach the patient to complete the Transitions of Care (Post Inpatient/ED visit) call.   Signature Julian Lemmings, LPN [facility] Nurse Health Advisor Direct Dial [phone]

## 2024-07-25 LAB — CULTURE, BLOOD (ROUTINE X 2)
Culture: NO GROWTH
Culture: NO GROWTH
Special Requests: ADEQUATE
Special Requests: ADEQUATE

## 2024-07-25 NOTE — Transitions of Care (Post Inpatient/ED Visit) (Signed)
   07/25/2024  Name: Cassandra Roberts MRN: [id number] DOB: Mar 13, 1991  Today's TOC FU Call Status: Today's TOC FU Call Status:: Unsuccessful Call (3rd Attempt) Unsuccessful Call (1st Attempt) Date: 07/23/24 Unsuccessful Call (2nd Attempt) Date: 07/24/24 Unsuccessful Call (3rd Attempt) Date: 07/25/24  Attempted to reach the patient regarding the most recent Inpatient/ED visit.  Follow Up Plan: No further outreach attempts will be made at this time. We have been unable to contact the patient.  Signature Julian Lemmings, LPN [facility] Nurse Health Advisor Direct Dial [phone]
